# Patient Record
Sex: Female | Born: 1935
Health system: Southern US, Community
[De-identification: ages and names within clinical notes are randomized; demographics above are authoritative.]

## PROBLEM LIST (undated history)

## (undated) DIAGNOSIS — K7460 Unspecified cirrhosis of liver: Secondary | ICD-10-CM

## (undated) DIAGNOSIS — Z8719 Personal history of other diseases of the digestive system: Secondary | ICD-10-CM

## (undated) DIAGNOSIS — Z9889 Other specified postprocedural states: Secondary | ICD-10-CM

## (undated) DIAGNOSIS — I080 Rheumatic disorders of both mitral and aortic valves: Secondary | ICD-10-CM

## (undated) DIAGNOSIS — G40909 Epilepsy, unspecified, not intractable, without status epilepticus: Secondary | ICD-10-CM

## (undated) HISTORY — DX: Unspecified cirrhosis of liver: K74.60

## (undated) HISTORY — DX: Epilepsy, unspecified, not intractable, without status epilepticus: G40.909

## (undated) HISTORY — DX: Other specified postprocedural states: Z98.890

## (undated) HISTORY — DX: Rheumatic disorders of both mitral and aortic valves: I08.0

## (undated) HISTORY — DX: Personal history of other diseases of the digestive system: Z87.19

---

## 2001-11-25 ENCOUNTER — Encounter: Payer: Self-pay | Admitting: Emergency Medicine

## 2001-11-25 ENCOUNTER — Emergency Department (HOSPITAL_COMMUNITY): Admission: EM | Admit: 2001-11-25 | Discharge: 2001-11-25 | Payer: Self-pay | Admitting: Emergency Medicine

## 2004-10-24 ENCOUNTER — Ambulatory Visit: Payer: Self-pay | Admitting: Internal Medicine

## 2004-10-27 ENCOUNTER — Encounter: Admission: RE | Admit: 2004-10-27 | Discharge: 2004-10-27 | Payer: Self-pay | Admitting: Internal Medicine

## 2004-10-31 ENCOUNTER — Ambulatory Visit: Payer: Self-pay | Admitting: Internal Medicine

## 2004-11-02 ENCOUNTER — Ambulatory Visit: Payer: Self-pay | Admitting: Internal Medicine

## 2004-11-07 ENCOUNTER — Ambulatory Visit: Payer: Self-pay | Admitting: Internal Medicine

## 2004-11-11 ENCOUNTER — Ambulatory Visit: Payer: Self-pay | Admitting: Internal Medicine

## 2004-11-11 ENCOUNTER — Ambulatory Visit: Payer: Self-pay | Admitting: Pulmonary Disease

## 2004-11-11 ENCOUNTER — Inpatient Hospital Stay (HOSPITAL_COMMUNITY): Admission: EM | Admit: 2004-11-11 | Discharge: 2004-11-14 | Payer: Self-pay | Admitting: Emergency Medicine

## 2004-11-20 ENCOUNTER — Ambulatory Visit: Payer: Self-pay | Admitting: Internal Medicine

## 2004-11-28 ENCOUNTER — Ambulatory Visit: Payer: Self-pay | Admitting: Internal Medicine

## 2004-11-29 ENCOUNTER — Ambulatory Visit: Payer: Self-pay | Admitting: Internal Medicine

## 2004-12-11 ENCOUNTER — Ambulatory Visit: Payer: Self-pay | Admitting: Internal Medicine

## 2005-01-03 ENCOUNTER — Ambulatory Visit: Payer: Self-pay | Admitting: Internal Medicine

## 2005-01-10 ENCOUNTER — Ambulatory Visit: Payer: Self-pay | Admitting: Internal Medicine

## 2005-01-16 ENCOUNTER — Ambulatory Visit: Payer: Self-pay | Admitting: Internal Medicine

## 2005-02-19 ENCOUNTER — Ambulatory Visit: Payer: Self-pay | Admitting: Internal Medicine

## 2005-03-20 ENCOUNTER — Ambulatory Visit: Payer: Self-pay | Admitting: Internal Medicine

## 2005-04-26 ENCOUNTER — Ambulatory Visit: Payer: Self-pay | Admitting: Internal Medicine

## 2005-06-29 ENCOUNTER — Emergency Department (HOSPITAL_COMMUNITY): Admission: EM | Admit: 2005-06-29 | Discharge: 2005-06-29 | Payer: Self-pay | Admitting: Emergency Medicine

## 2005-08-14 ENCOUNTER — Ambulatory Visit: Payer: Self-pay | Admitting: Internal Medicine

## 2005-10-08 ENCOUNTER — Ambulatory Visit: Payer: Self-pay | Admitting: Internal Medicine

## 2005-12-13 ENCOUNTER — Ambulatory Visit: Payer: Self-pay | Admitting: Internal Medicine

## 2005-12-19 ENCOUNTER — Encounter: Admission: RE | Admit: 2005-12-19 | Discharge: 2005-12-19 | Payer: Self-pay | Admitting: Orthopedic Surgery

## 2006-03-01 ENCOUNTER — Ambulatory Visit: Payer: Self-pay | Admitting: Internal Medicine

## 2006-05-12 ENCOUNTER — Emergency Department (HOSPITAL_COMMUNITY): Admission: EM | Admit: 2006-05-12 | Discharge: 2006-05-12 | Payer: Self-pay | Admitting: Emergency Medicine

## 2006-05-14 ENCOUNTER — Ambulatory Visit: Payer: Self-pay | Admitting: Internal Medicine

## 2006-06-12 ENCOUNTER — Ambulatory Visit: Payer: Self-pay | Admitting: Pulmonary Disease

## 2006-06-13 ENCOUNTER — Ambulatory Visit (HOSPITAL_COMMUNITY): Admission: RE | Admit: 2006-06-13 | Discharge: 2006-06-13 | Payer: Self-pay | Admitting: Pulmonary Disease

## 2006-06-18 ENCOUNTER — Ambulatory Visit: Payer: Self-pay | Admitting: Pulmonary Disease

## 2006-06-27 ENCOUNTER — Ambulatory Visit: Payer: Self-pay | Admitting: Internal Medicine

## 2006-06-28 ENCOUNTER — Ambulatory Visit: Payer: Self-pay

## 2006-06-28 ENCOUNTER — Encounter: Payer: Self-pay | Admitting: Internal Medicine

## 2006-07-01 ENCOUNTER — Inpatient Hospital Stay (HOSPITAL_BASED_OUTPATIENT_CLINIC_OR_DEPARTMENT_OTHER): Admission: RE | Admit: 2006-07-01 | Discharge: 2006-07-01 | Payer: Self-pay | Admitting: Cardiology

## 2006-07-01 ENCOUNTER — Ambulatory Visit: Payer: Self-pay | Admitting: Cardiology

## 2006-07-22 ENCOUNTER — Ambulatory Visit: Payer: Self-pay | Admitting: Internal Medicine

## 2006-08-19 ENCOUNTER — Ambulatory Visit: Payer: Self-pay | Admitting: Pulmonary Disease

## 2006-09-11 ENCOUNTER — Ambulatory Visit (HOSPITAL_COMMUNITY): Admission: RE | Admit: 2006-09-11 | Discharge: 2006-09-11 | Payer: Self-pay | Admitting: Internal Medicine

## 2006-09-11 ENCOUNTER — Ambulatory Visit: Payer: Self-pay | Admitting: Internal Medicine

## 2006-09-11 ENCOUNTER — Encounter: Payer: Self-pay | Admitting: Internal Medicine

## 2006-09-26 ENCOUNTER — Ambulatory Visit: Payer: Self-pay | Admitting: Pulmonary Disease

## 2006-09-27 ENCOUNTER — Ambulatory Visit: Payer: Self-pay | Admitting: Internal Medicine

## 2006-11-05 ENCOUNTER — Ambulatory Visit: Payer: Self-pay | Admitting: Internal Medicine

## 2007-03-17 ENCOUNTER — Ambulatory Visit: Payer: Self-pay | Admitting: Internal Medicine

## 2007-03-17 LAB — CONVERTED CEMR LAB
Albumin: 2.9 g/dL — ABNORMAL LOW (ref 3.5–5.2)
Creatinine, Ser: 0.6 mg/dL (ref 0.4–1.2)
INR: 1.5 (ref 0.9–2.0)
Prothrombin Time: 15.3 s — ABNORMAL HIGH (ref 10.0–14.0)
Sodium: 144 meq/L (ref 135–145)
Total Bilirubin: 2.5 mg/dL — ABNORMAL HIGH (ref 0.3–1.2)
aPTT: 46.6 s — ABNORMAL HIGH (ref 26.5–36.5)

## 2007-03-28 ENCOUNTER — Ambulatory Visit: Payer: Self-pay | Admitting: Internal Medicine

## 2007-03-28 ENCOUNTER — Inpatient Hospital Stay (HOSPITAL_COMMUNITY): Admission: EM | Admit: 2007-03-28 | Discharge: 2007-04-03 | Payer: Self-pay | Admitting: Emergency Medicine

## 2007-04-03 ENCOUNTER — Encounter: Payer: Self-pay | Admitting: Internal Medicine

## 2007-05-06 ENCOUNTER — Ambulatory Visit: Payer: Self-pay | Admitting: Internal Medicine

## 2007-05-06 LAB — CONVERTED CEMR LAB
Albumin: 3 g/dL — ABNORMAL LOW (ref 3.5–5.2)
Creatinine, Ser: 0.7 mg/dL (ref 0.4–1.2)
INR: 1.6 (ref 0.9–2.0)
Prothrombin Time: 15.4 s — ABNORMAL HIGH (ref 10.0–14.0)
Sodium: 139 meq/L (ref 135–145)
Total Bilirubin: 2.9 mg/dL — ABNORMAL HIGH (ref 0.3–1.2)
aPTT: 51.4 s — ABNORMAL HIGH (ref 26.5–36.5)

## 2007-06-05 ENCOUNTER — Telehealth: Payer: Self-pay | Admitting: Internal Medicine

## 2007-06-09 ENCOUNTER — Encounter: Admission: RE | Admit: 2007-06-09 | Discharge: 2007-06-09 | Payer: Self-pay | Admitting: Pediatrics

## 2007-06-09 DIAGNOSIS — K7460 Unspecified cirrhosis of liver: Secondary | ICD-10-CM

## 2007-06-09 DIAGNOSIS — M810 Age-related osteoporosis without current pathological fracture: Secondary | ICD-10-CM

## 2007-06-09 DIAGNOSIS — Z8719 Personal history of other diseases of the digestive system: Secondary | ICD-10-CM

## 2007-06-16 ENCOUNTER — Encounter: Admission: RE | Admit: 2007-06-16 | Discharge: 2007-06-16 | Payer: Self-pay | Admitting: Internal Medicine

## 2007-07-04 HISTORY — PX: LIVER TRANSPLANT: SHX410

## 2007-07-14 ENCOUNTER — Telehealth: Payer: Self-pay | Admitting: Internal Medicine

## 2007-07-29 ENCOUNTER — Encounter: Payer: Self-pay | Admitting: Internal Medicine

## 2007-11-06 ENCOUNTER — Ambulatory Visit: Payer: Self-pay | Admitting: Physical Medicine & Rehabilitation

## 2007-11-06 ENCOUNTER — Inpatient Hospital Stay (HOSPITAL_COMMUNITY)
Admission: RE | Admit: 2007-11-06 | Discharge: 2007-11-18 | Payer: Self-pay | Admitting: Physical Medicine & Rehabilitation

## 2008-04-19 ENCOUNTER — Encounter: Payer: Self-pay | Admitting: Internal Medicine

## 2008-04-27 ENCOUNTER — Telehealth: Payer: Self-pay | Admitting: Internal Medicine

## 2008-04-28 ENCOUNTER — Telehealth: Payer: Self-pay | Admitting: Internal Medicine

## 2008-07-26 ENCOUNTER — Encounter: Payer: Self-pay | Admitting: Internal Medicine

## 2008-09-14 ENCOUNTER — Ambulatory Visit: Payer: Self-pay | Admitting: Internal Medicine

## 2008-11-17 ENCOUNTER — Ambulatory Visit: Payer: Self-pay | Admitting: Internal Medicine

## 2008-11-18 ENCOUNTER — Ambulatory Visit: Payer: Self-pay | Admitting: Internal Medicine

## 2008-11-19 ENCOUNTER — Telehealth: Payer: Self-pay | Admitting: Internal Medicine

## 2009-01-24 ENCOUNTER — Encounter: Payer: Self-pay | Admitting: Internal Medicine

## 2009-02-02 ENCOUNTER — Telehealth: Payer: Self-pay | Admitting: Internal Medicine

## 2009-04-12 ENCOUNTER — Telehealth: Payer: Self-pay | Admitting: Internal Medicine

## 2009-04-27 ENCOUNTER — Telehealth: Payer: Self-pay | Admitting: Internal Medicine

## 2009-05-09 ENCOUNTER — Telehealth: Payer: Self-pay | Admitting: Internal Medicine

## 2009-05-26 ENCOUNTER — Encounter: Payer: Self-pay | Admitting: Internal Medicine

## 2009-07-25 ENCOUNTER — Encounter: Payer: Self-pay | Admitting: Internal Medicine

## 2009-09-19 ENCOUNTER — Ambulatory Visit: Payer: Self-pay | Admitting: Internal Medicine

## 2009-11-15 ENCOUNTER — Telehealth: Payer: Self-pay | Admitting: Internal Medicine

## 2010-01-30 ENCOUNTER — Encounter: Payer: Self-pay | Admitting: Internal Medicine

## 2010-03-28 ENCOUNTER — Telehealth: Payer: Self-pay | Admitting: Internal Medicine

## 2010-04-03 ENCOUNTER — Telehealth: Payer: Self-pay | Admitting: Internal Medicine

## 2010-05-05 ENCOUNTER — Telehealth: Payer: Self-pay | Admitting: Internal Medicine

## 2010-05-09 ENCOUNTER — Telehealth: Payer: Self-pay | Admitting: Internal Medicine

## 2010-06-08 ENCOUNTER — Ambulatory Visit: Payer: Self-pay | Admitting: Internal Medicine

## 2010-06-08 DIAGNOSIS — R109 Unspecified abdominal pain: Secondary | ICD-10-CM | POA: Insufficient documentation

## 2010-06-08 DIAGNOSIS — G4700 Insomnia, unspecified: Secondary | ICD-10-CM | POA: Insufficient documentation

## 2010-06-08 DIAGNOSIS — Z944 Liver transplant status: Secondary | ICD-10-CM | POA: Insufficient documentation

## 2010-06-08 LAB — HM MAMMOGRAPHY

## 2010-06-09 LAB — CONVERTED CEMR LAB
ALT: 26 units/L (ref 0–35)
Alkaline Phosphatase: 59 units/L (ref 39–117)
Calcium: 9 mg/dL (ref 8.4–10.5)
Chloride: 105 meq/L (ref 96–112)
Creatinine, Ser: 1 mg/dL (ref 0.4–1.2)
GFR calc non Af Amer: 56.7 mL/min (ref >60)
Glucose, Bld: 86 mg/dL (ref 70–99)
MCHC: 34.6 g/dL (ref 30.0–36.0)
MCV: 98 fL (ref 78.0–100.0)
Neutrophils Relative %: 62.7 % (ref 43.0–77.0)
Platelets: 185 10*3/uL (ref 150.0–400.0)
RDW: 13.4 % (ref 11.5–14.6)
Sodium: 140 meq/L (ref 135–145)
Total Protein: 6.7 g/dL (ref 6.0–8.3)
WBC: 3.9 10*3/uL — ABNORMAL LOW (ref 4.5–10.5)

## 2010-07-31 ENCOUNTER — Encounter: Payer: Self-pay | Admitting: Internal Medicine

## 2010-10-12 ENCOUNTER — Ambulatory Visit: Payer: Self-pay | Admitting: Internal Medicine

## 2010-11-13 ENCOUNTER — Telehealth: Payer: Self-pay | Admitting: Internal Medicine

## 2010-12-31 LAB — CONVERTED CEMR LAB
ALT: 46 units/L — ABNORMAL HIGH (ref 0–35)
Albumin: 3.7 g/dL (ref 3.5–5.2)
Alkaline Phosphatase: 95 units/L (ref 39–117)
Bilirubin, Direct: 0.1 mg/dL (ref 0.0–0.3)
Lymphocytes Relative: 32.9 % (ref 12.0–46.0)
MCV: 93.9 fL (ref 78.0–100.0)
Monocytes Absolute: 0.2 10*3/uL (ref 0.1–1.0)
Neutrophils Relative %: 54.8 % (ref 43.0–77.0)
Platelets: 104 10*3/uL — ABNORMAL LOW (ref 150–400)
Total Bilirubin: 0.8 mg/dL (ref 0.3–1.2)
Total Protein: 6.7 g/dL (ref 6.0–8.3)
WBC: 2.5 10*3/uL — ABNORMAL LOW (ref 4.5–10.5)

## 2011-01-02 NOTE — Progress Notes (Signed)
Summary: [name] cr  Phone Note Refill Request Call back at [facility] Phone [phone] Message from:  Patient---live call  Refills Requested: Medication #1:  AMBIEN CR 12.5 MG CR-TABS take one tab at bedtime. fax to [facility]  Initial call taken by: Warnell Forester,  March 28, 2010 9:59 AM  Follow-up for Phone Call        see Rx Follow-up by: Gladis Riffle, RN,  March 28, 2010 2:53 PM    Prescriptions: AMBIEN CR 12.5 MG CR-TABS (ZOLPIDEM TARTRATE) take one tab at bedtime  #90 x 1   Entered by:   Gladis Riffle, RN   Authorized by:   Birdie Sons MD   Signed by:   Gladis Riffle, RN on 03/28/2010   Method used:   Printed then faxed to ...       [facility]* (mail-order)             ,          Ph: [phone]       Fax: [phone]   RxID:   [id number]

## 2011-01-02 NOTE — Assessment & Plan Note (Signed)
Summary: FLU SHOT // RS   Nurse Visit   Allergies: 1)  Codeine Phosphate (Codeine Phosphate)  Orders Added: 1)  Flu Vaccine 48yrs + MEDICARE PATIENTS [Q2039] 2)  Administration Flu vaccine - MCR [G0008] Flu Vaccine Consent Questions     Do you have a history of severe allergic reactions to this vaccine? no    Any prior history of allergic reactions to egg and/or gelatin? no    Do you have a sensitivity to the preservative Thimersol? no    Do you have a past history of Guillan-Barre Syndrome? no    Do you currently have an acute febrile illness? no    Have you ever had a severe reaction to latex? no    Vaccine information given and explained to patient? yes    Are you currently pregnant? no    Lot Number:[id number]   Exp Date:06/02/2011   Site Given  Left Deltoid IM]  .lbmedflu

## 2011-01-02 NOTE — Progress Notes (Signed)
Summary: Ambien/ [facility]  Phone Note Outgoing Call   Call placed by: Mervin Hack CMA Duncan Dull),  May 05, 2010 10:18 AM Call placed to: Patient Summary of Call: calling to advise that we received a refill request for Zolpidem Tartrate ER tabs, but I see this was done in April. Pt's husband states they only want BMN which was on the refill, husband states they have mailed to [facility] already, he will call to [facility] to see if they received. If not he will call us back for refill. I will change in pt's chart BMN. Initial call taken by: Mervin Hack CMA (AAMA),  May 05, 2010 10:21 AM    New/Updated Medications: AMBIEN CR 12.5 MG CR-TABS (ZOLPIDEM TARTRATE) take one tab at bedtime (Brand Name Only)

## 2011-01-02 NOTE — Progress Notes (Signed)
Summary: Pt req refill of Pantoprazole Sodium--needs ov  Phone Note Call from Patient Call back at Home Phone [phone]   Caller: spouse-William Summary of Call: Pts spouse called and said that his wife needs refill of Pantoprazole Sodium Tabs (generic for Protonix tabs) Please call in to [facility].      Initial call taken by: Lucy Antigua,  May 09, 2010 2:00 PM  Follow-up for Phone Call        see Rx.  Husband notified. Follow-up by: Gladis Riffle, RN,  May 09, 2010 2:29 PM    Prescriptions: PROTONIX 40 MG TBEC (PANTOPRAZOLE SODIUM) Take 1 tablet by mouth once a day  #90 x 1   Entered by:   Gladis Riffle, RN   Authorized by:   Birdie Sons MD   Signed by:   Gladis Riffle, RN on 05/09/2010   Method used:   Electronically to        [facility]* (mail-order)             ,          Ph: [phone]       Fax: [phone]   RxID:   [id number]

## 2011-01-02 NOTE — Progress Notes (Signed)
Summary: REFILL alendronate  Phone Note Refill Request Call back at Home Phone [phone] Message from:  SPOUSE---LIVE CALL on GENERIC ONLY  Refills Requested: Medication #1:  FOSAMAX 70 MG TABS Take 1 tablet by mouth once a week   Brand Name Necessary? No FAX TO [facility]  Initial call taken by: Warnell Forester,  Apr 03, 2010 10:47 AM  Follow-up for Phone Call        see Rx.  Patient husband notified.  Follow-up by: Gladis Riffle, RN,  Apr 03, 2010 11:47 AM    New/Updated Medications: FOSAMAX 35 MG TABS (ALENDRONATE SODIUM) one tablet by mouth once a week Prescriptions: FOSAMAX 35 MG TABS (ALENDRONATE SODIUM) one tablet by mouth once a week  #14 x 3   Entered by:   Gladis Riffle, RN   Authorized by:   Birdie Sons MD   Signed by:   Gladis Riffle, RN on 04/03/2010   Method used:   Electronically to        [facility]* (mail-order)             ,          Ph: [phone]       Fax: [phone]   RxID:   [id number]

## 2011-01-02 NOTE — Letter (Signed)
Summary: [facility]  [facility]   Imported By: Lester Onley 02/23/2010 08:52:27  _____________________________________________________________________  External Attachment:    Type:   Image     Comment:   External Document

## 2011-01-02 NOTE — Letter (Signed)
Summary: [facility]  [facility]   Imported By: Sherian Rein 09/01/2010 08:52:30  _____________________________________________________________________  External Attachment:    Type:   Image     Comment:   External Document

## 2011-01-02 NOTE — Assessment & Plan Note (Signed)
Summary: emp/pt fasting/cjr   Vital Signs:  Patient profile:   77 year old female Menstrual status:  postmenopausal Height:      61 inches Weight:      133 pounds BMI:     25.22 Pulse rate:   80 / minute Pulse rhythm:   regular BP sitting:   120 / 60  Vitals Entered By: Gladis Riffle, RN (June 08, 2010 10:08 AM) CC: annual review of systems,fasting Is Patient Diabetic? No     Menstrual Status postmenopausal Last PAP Result normal-pts report1/1/11   CC:  annual review of systems and fasting.  History of Present Illness: Here for Medicare AWV:  1.   Risk factors based on Past M, S, F history: see list 2.   Physical Activities: she is able to do all of adls 3.   Depression/mood: ---pt denies 4.   Hearing..no compliants:  5.   ADL's: --able to do all 6.   Fall Risk: --none noted 7.   Home Safety: no concerns 8.   Height, weight-, & visual acuity: no concerns identified 9.   Counseling: advised regular exercise, low fat diet 10.   Labs ordered based on risk factors: ---see list 11.           Referral Coordination---none needed 12.           Care Plan---advised regular exercise 13.            Cognitive Assessment---motor, sensory and cognitive functions are intact  new complaints---abdominal bloating she notes a constant feeling of bloating---not related to eating. this has been an intermittent complaint since liver transplant  liver transplant -- followed at [facility]  hepatopulmonary syndrome---complicated liver transplant, but she is doing very well and is completely off 02  All other systems reviewed and were negative    Preventive Screening-Counseling & Management  Alcohol-Tobacco     Smoking Status: quit     Year Quit: 2000  Current Problems (verified): 1)  Preventive Health Care  (ICD-V70.0) 2)  Liver Replaced By Transplant  (ICD-V42.7) 3)  Insomnia-sleep Disorder-unspec  (ICD-780.52) 4)  Abdominal Pain  (ICD-789.00) 5)  Osteoporosis  (ICD-733.00) 6)   Gastrointestinal Hemorrhage, Hx of  (ICD-V12.79) 7)  Cirrhosis  (ICD-571.5)  Current Medications (verified): 1)  Fosamax 35 Mg Tabs (Alendronate Sodium) .... One Tablet By Mouth Once A Week 2)  Protonix 40 Mg Tbec (Pantoprazole Sodium) .... Take 1 Tablet By Mouth Once A Day 3)  Prograf 1 Mg Caps (Tacrolimus) .... Take 5 Daily 4)  Keppra 500 Mg Tabs (Levetiracetam) .... Take 2 Daily 5)  Multivitamins  Tabs (Multiple Vitamin) .... Once Daily 6)  Vitamin D 1000 Unit Caps (Cholecalciferol) .... Once Daily 7)  Glucosamine 1500 Complex  Caps (Glucosamine-Chondroit-Vit C-Mn) .... Once Daily 8)  Ambien Cr 12.5 Mg Cr-Tabs (Zolpidem Tartrate) .... Take One Tab At Bedtime (Brand Name Only)  Allergies: 1)  Codeine Phosphate (Codeine Phosphate)  Past History:  Past Medical History: Last updated: 06/09/2007 Cirrhosis Gastrointestinal hemorrhage, hx of Osteoporosis  Past Surgical History: Last updated: 11-20-08 GI bleed liver transplant---complicated course ([facility])---8/08  Family History: Last updated: 20-Nov-2008 father-deceased 10 yo mother deceased 41 yo  Social History: Last updated: November 20, 2008 Married Alcohol use-no---previous alcohol abuse  Risk Factors: Smoking Status: quit (06/08/2010)  Social History: Smoking Status:  quit  Physical Exam  General:  alert and well-developed.   Head:  normocephalic and atraumatic.   Eyes:  pupils equal and pupils round.  no icterus Ears:  R  ear normal and L ear normal.   Neck:  No deformities, masses, or tenderness noted. Chest Wall:  No deformities, masses, or tenderness noted. Heart:  normal rate and regular rhythm.   Abdomen:  soft and non-tender.   Msk:  No deformity or scoliosis noted of thoracic or lumbar spine.   Neurologic:  cranial nerves II-XII intact and gait normal.   Skin:  turgor normal and color normal.   Psych:  normally interactive and good eye contact.     Impression & Recommendations:  Problem # 1:  Preventive  Health Care (ICD-V70.0) health maint UTD colonoscopy scheduled by [facility] i've encouraged her to stay physicially active  Problem # 2:  ABDOMINAL PAIN (ICD-789.00) bloating sensation take labs today she has f/u with [facility] within the next rew weeks. I think this should be addressed there  Problem # 3:  INSOMNIA-SLEEP DISORDER-UNSPEC (ICD-780.52) discussed other meds she will consider other possibilities but she prefers avoiding additional or different meds... I agree try to minimize medications Her updated medication list for this problem includes:    Ambien Cr 12.5 Mg Cr-tabs (Zolpidem tartrate) .Marland Kitchen... Take one tab at bedtime (brand name only)  Complete Medication List: 1)  Fosamax 35 Mg Tabs (Alendronate sodium) .... One tablet by mouth once a week 2)  Protonix 40 Mg Tbec (Pantoprazole sodium) .... Take 1 tablet by mouth once a day 3)  Prograf 1 Mg Caps (Tacrolimus) .... Take 5 daily 4)  Keppra 500 Mg Tabs (Levetiracetam) .... Take 2 daily 5)  Multivitamins Tabs (Multiple vitamin) .... Once daily 6)  Vitamin D 1000 Unit Caps (Cholecalciferol) .... Once daily 7)  Glucosamine 1500 Complex Caps (Glucosamine-chondroit-vit c-mn) .... Once daily 8)  Ambien Cr 12.5 Mg Cr-tabs (Zolpidem tartrate) .... Take one tab at bedtime (brand name only)  Other Orders: Venipuncture (78242) TLB-BMP (Basic Metabolic Panel-BMET) (80048-METABOL) TLB-CBC Platelet - w/Differential (85025-CBCD) TLB-Hepatic/Liver Function Pnl (80076-HEPATIC) TLB-TSH (Thyroid Stimulating Hormone) (35361-WER)   Preventive Care Screening  Colonoscopy:    Date:  01/03/2005    Next Due:  01/2011    Results:  gi bleed   Mammogram:    Date:  12/03/2009    Next Due:  12/2011    Results:  normal-pt's report   Pap Smear:    Date:  12/03/2009    Next Due:  12/2012    Results:  normal-pts report1/1/11

## 2011-01-04 NOTE — Progress Notes (Signed)
Summary: REFILL REQUEST  Phone Note Refill Request Message from:  Patient on November 13, 2010 10:45 AM  Refills Requested: Medication #1:  PROTONIX 40 MG TBEC Take 1 tablet by mouth once a day   Notes: [facility].    Initial call taken by: Debbra Riding,  November 13, 2010 10:45 AM  Follow-up for Phone Call        Rx called to pharmacy Follow-up by: Alfred Levins, CMA,  November 13, 2010 11:41 AM    Prescriptions: PROTONIX 40 MG TBEC (PANTOPRAZOLE SODIUM) Take 1 tablet by mouth once a day  #90 x 1   Entered by:   Alfred Levins, CMA   Authorized by:   Birdie Sons MD   Signed by:   Alfred Levins, CMA on 11/13/2010   Method used:   Electronically to        [facility]* (retail)             ,          Ph: [phone]       Fax: [phone]   RxID:   [id number]

## 2011-01-29 ENCOUNTER — Encounter: Payer: Self-pay | Admitting: Internal Medicine

## 2011-02-19 ENCOUNTER — Telehealth: Payer: Self-pay | Admitting: Internal Medicine

## 2011-02-19 DIAGNOSIS — G4700 Insomnia, unspecified: Secondary | ICD-10-CM

## 2011-02-19 NOTE — Telephone Encounter (Signed)
Pt needs refill of Ambien 12.5 mg CR to [facility] order.

## 2011-02-20 NOTE — Letter (Signed)
Summary: [facility]  [facility]   Imported By: Lester Inverness 02/12/2011 09:08:29  _____________________________________________________________________  External Attachment:    Type:   Image     Comment:   External Document

## 2011-02-22 MED ORDER — ZOLPIDEM TARTRATE ER 12.5 MG PO TBCR: 12.5000 mg | EXTENDED_RELEASE_TABLET | Freq: Every evening | ORAL | Status: DC | PRN

## 2011-02-22 NOTE — Telephone Encounter (Signed)
rx faxed to [facility].

## 2011-04-17 NOTE — Discharge Summary (Signed)
[name], [name]               ACCOUNT NO.:  [id number]   MEDICAL RECORD NO.:  [id number]          PATIENT TYPE:  IPS   LOCATION:  4010                         FACILITY:  [facility]   PHYSICIAN:  Ranelle Oyster, M.D.DATE OF BIRTH:  25-Dec-1931   DATE OF ADMISSION:  11/06/2007  DATE OF DISCHARGE:  11/18/2007                               DISCHARGE SUMMARY   DISCHARGE DIAGNOSES:  1. Deconditioning after liver transplant August 2008 with multimedical      issues.  2. Seizure disorder.  3. Gastroesophageal reflux disease.  4. Depression.  5. Beta lactamase urinary tract infection.   A 77 year old female with a history of cirrhosis due to chronic  alcoholism with orthoptic liver transplant July 18, 2007 at [facility] complicated by respiratory failure September 20 with  tracheostomy September 26, 2007.  Developed Clostridium difficile colitis.  Completed a full course of Flagyl.  Pertaining to recent liver  transplant, she was maintained on Tacrolimus and steroids of different  doses throughout her hospital stay.  Echocardiogram October 5 showed  normal left ventricular function.  Bouts of PAF, and placed on Cardizem  rate controlled, contact precautions for MRSA.  All antibiotics  completed.  Tracheostomy removed.  Diet advanced.  She had been admitted  to [facility] for a short time as she continued to improve for  deconditioning.   PAST MEDICAL HISTORY:  See discharge diagnoses.  Remote smoker.  Remote  alcohol.   ALLERGIES:  Codeine.   SOCIAL HISTORY:  Lives with daughter in [place] 4-level home, bedroom  upstairs, daughter works day shifts.  Husband can assist.   MEDICATIONS PRIOR TO ADMISSION:  Not made available.   REHABILITATION HOSPITAL COURSE:  The patient was admitted to inpatient  rehab services with therapies initiated on a 3-hour daily basis  consisting of physical therapy, occupational therapy, and rehabilitation  nursing.  The following issues were  addressed during the patient's  rehabilitation stay.  Pertaining to [name]'s deconditioning after  liver transplant August 2008 she was doing quite well.  She would follow  up with [facility].  Routine weekly labs had been ordered with  latest chemistries December 15 showing hemoglobin 9.5, hematocrit 27,  platelet 179,000.  Sodium 138, potassium 4.0, BUN 80, creatinine 0.7.  She would remain on her Tacrolimus as well as prednisone 15 mg daily  with changes made at the recommendations of [facility].  She  had a history of seizure disorder.  No seizure during her rehabilitation  stay.  She remained on Keppra of 500 mg q.12 h.  She had a history of  depression after long hospital stay.  She was tapered off her Risperdal.  She was on low doses of Klonopin q.12 h.  Iron supplement had been added  for some anemia.   Overall for a functional status she was minimal assist to close  supervision ambulating 110 feet, handheld assistance for navigating  stairs, close supervision for activities of daily living.  Overall her  strength and endurance had greatly improved; and she was encouraged with  her overall progress with plan  to be discharged to home.   DISCHARGE MEDICATIONS INCLUDED:  1. Tacrolimus 4 mg sublingual q.12 h.  2. Klonopin 0.25 mg q.12 h.  3. Aspirin 81 mg p.o. daily.  4. Protonix 40 mg daily.  5. Keppra 500 mg p.o. q.12 h.  6. Prednisone 15 mg daily.  7. Trinsicon 1 capsule twice daily.  8. Oxycodone immediate release had been DISCONTINUED.  9. Tylenol as needed.   ACTIVITY:  As tolerated.   DIET:  Regular.   SPECIAL INSTRUCTIONS:  The patient should follow up at [facility] after recent liver transplant Dr. Cato Mulligan as  medical management  with [facility].      [name], P.A.      Ranelle Oyster, M.D.  Electronically Signed    DA/MEDQ  D:  11/17/2007  T:  11/17/2007  Job:  [id number]   cc:   Ranelle Oyster,  M.D.  Bruce Rexene Edison Cato Mulligan, MD  Hedwig Morton. Juanda Chance, MD

## 2011-04-17 NOTE — H&P (Signed)
[name], [name] NO.:  [id number]   MEDICAL RECORD NO.:  [id number]          PATIENT TYPE:  IPS   LOCATION:  4010                         FACILITY:  [facility]   PHYSICIAN:  Ellwood Dense, M.D.   DATE OF BIRTH:  Jan 28, 1932   DATE OF ADMISSION:  11/06/2007  DATE OF DISCHARGE:                              HISTORY & PHYSICAL   PRIMARY CARE PHYSICIAN:  Dr. Cato Mulligan.   CARDIOLOGY:  [facility].   GASTROENTEROLOGY:  Dr. Lina Sar.   HEPATOLOGIST:  [facility].   HISTORY OF PRESENT ILLNESS:  [name] is a 77 year old adult female  with history of alcoholic cirrhosis with prior orthotopic liver  transplant at [facility] July 18, 2007.  That was  complicated by respiratory failure August 23, 2007 and eventual  tracheostomy September 26, 2007.  She also developed Clostridium difficile  colitis and was treated with full course of Flagyl.   The patient was at that point was judged to need extensive care and was  transferred to [facility].  She has been at the [facility]  for the past several weeks.  She has a history of this liver transplant  prior to her respiratory failure, and she has had been maintained on a  combination of tacrolimus along with prednisone with variable doses.   Echocardiogram September 07, 2007 showed normal left ventricular systolic  function.  She has had bouts of paroxysmal atrial fibrillation and was  placed on a Cardizem drip with rate control.  She has had contact  precautions for MRSA infection with subsequent antibiotics being  completed.  Tracheostomy has since been discontinued, and she has had  her panda tube recently discontinued with start of oral diet.  She has  variable intake right now with some history of poor intake dating for  years.   The patient was evaluated by the rehabilitation physicians and felt to  be an appropriate candidate for inpatient dilatation.   REVIEW  OF SYSTEMS:  Positive for depression and seizures.   PAST MEDICAL HISTORY:  1. History of seizure disorder with one questionable episode in April,      2008 with ongoing treatment with Keppra.  2. Mixed alcoholic cirrhosis with orthotopic liver transplant July 18, 2007 at [facility].  3. Chronic respiratory failure with right to left intrapulmonary      shunting.  4. Depression.  5. Gastroesophageal reflux disease with esophageal varices.   FAMILY HISTORY:  Noncontributory.   SOCIAL HISTORY:  The patient lives with her husband and daughter in  [place] in a four-level home with bedrooms upstairs.  The daughter  works days, but the husband can assist as needed and is in good health  overall.  She has a remote history of tobacco and alcohol usage in the  past.   FUNCTIONAL HISTORY PRIOR TO ADMISSION:  Independent prior to liver  transplant August 2008.   ALLERGIES:  CODEINE.   MEDICATIONS /PRIOR TO ADMISSION:  Family to bring in a list.   LABORATORY:  No recent labs noted  at [facility].   PHYSICAL EXAMINATION:  Reasonably well-appearing, thin, elderly adult  female lying in bed in no acute discomfort.  Vitals were not yet  obtained.  HEENT:  Normocephalic, nontraumatic.  CARDIOVASCULAR:  Regular rate and rhythm, S1-S2 without murmurs.  ABDOMEN:  Soft, nontender with positive bowel sounds with well-healed  wounds with J tube in place which is capped.  NEUROLOGIC:  Alert and oriented x3.  Cranial nerves II-XII were grossly  intact.  Bilateral upper extremity exam showed 4-/5 strength throughout.  Bulk and tone were normal, and reflexes were 2+ and symmetrical.  Sensation was intact to light touch throughout the bilateral upper  extremities.  Lower extremity exam showed hip flexion, knee extension  and ankle dorsiflexion at 3+ to 4-/5.  Bulk was decreased and tone was  normal.  Sensation was intact to light touch throughout the bilateral  lower  extremities.   IMPRESSION:  1. Status post liver transplant July 18, 2007 with multiple medical      complications including ventilatory dependent respiratory failure      along with dysphagia.  2. History of reported seizure disorder x1 April 2008.   Presently, the patient has deficits in ADLs, transfers and ambulation  related to the above-noted liver transplant with subsequent  deconditioning related to respiratory failure.   PLAN:  1. Admit to the rehabilitation unit for daily therapies to include      physical therapy for range of motion, strengthening, bed mobility,      transfers, pre-gait training, gait training and equipment      evaluation.  2. Occupational therapy for range of motion, strengthening, ADLs,      cognitive/perceptional training, splinting and equipment      evaluation.  3. Rehabilitation nursing for skin care, wound care and bowel and      bladder training as necessary.  4. Speech therapy for higher level cognition along with evaluation of      swallow as necessary.  5. Case management to assess home environment, assist with discharge      planning and arrange for appropriate follow-up care.  6. Social work to assess family and social support and assist in      discharge planning.  7. Continue regular diet.  8. Check admission labs including CBC and CMET in a.m. November 07, 2007.  9. Incentive spirometry q.2h. while awake.  10.Oxycodone 5 mg 1 tablet q.4h. p.r.n. for pain.  11.Two of 2 liters of O2 by nasal cannula at all times to keep      saturations greater than or equal to 90%.  12.Routine turning to prevent skin breakdown.  13.Dulcolax suppository 1 per rectum daily p.r.n.  14.Foley tube to straight drainage at present.  15.Draw blood from IV.  16.Lomotil 2 tablets p.o. q.8h. p.r.n. for loose stools/  17.Prednisone 17 mg p.o. daily.  18.Tacrolimus 4 mg sublingual q.12h.  19.Clonazepam 0.25 mg p.o. q.12h.  20.Risperidone 0.5 mg p.o.  q.12h.  21.Ambien CR 5 mg p.o. every night p.r.n. for sleep.  22.Aspirin 81 mg p.o. daily.  23.Omeprazole 20 mg p.o. daily.  24.Keppra 500 mg p.o. q.12h.  25.Grounds pass p.r.n. with staff or family.  26.Barrier cream to sacral area b.i.d.  27.Reinserted new Foley [place] #22.   PROGNOSIS:  Good.   ESTIMATED LENGTH OF STAY:  10-20 days.   GOALS:  Modified independent, ADLs, transfers and ambulation.           ______________________________  Ellwood Dense,  M.D.     DC/MEDQ  D:  11/06/2007  T:  11/06/2007  Job:  [id number]

## 2011-04-20 NOTE — Discharge Summary (Signed)
[name], [name]               ACCOUNT NO.:  [id number]   MEDICAL RECORD NO.:  [id number]          PATIENT TYPE:  INP   LOCATION:  1419                         FACILITY:  [facility]   PHYSICIAN:  Rosalyn Gess. Norins, MD  DATE OF BIRTH:  Oct 02, 1932   DATE OF ADMISSION:  03/28/2007  DATE OF DISCHARGE:  04/03/2007                               DISCHARGE SUMMARY   ADMISSION DIAGNOSES:  1. Dehydration.  2. Chronic cirrhosis with hepatic failure.  3. Chronic respiratory failure with right to left intrapulmonary      shunting.  4. Peripheral neuropathy.   DISCHARGE DIAGNOSES:  1. Dehydration.  2. Chronic cirrhosis with hepatic failure.  3. Chronic respiratory failure with right to left intrapulmonary      shunting.  4. Peripheral neuropathy.  5. New-onset seizure disorder.   CONSULTANTS:  Dr. Ellison Carwin, neurology.   PROCEDURES:  1. PA and lateral chest x-ray, March 28, 2007, with cardiomegaly with      chronic interstitial opacities.  2. CT of the brain without contrast, March 29, 2007, with no      intracranial hemorrhage, question of acute infarct right lenticular      nucleus. 3.  MRI of the brain without contrast, March 30, 2007,      revealing three areas of blood breakdown products two of which are      linear and do not have significant surround vasogenic edema at the      left parietofrontal and right parietotemporal areas.  Left temporal      lobe abnormalities rounded in configuration with surrounding white      matter, suggestive of mild vasogenic edema and demonstrates minimal      enhancement.  Hepatocellular changes best seen on precontrast      imaging of the ventricular nucleus and cerebral peduncles.  Atrophy      and mild to moderate nonspecific white matter type changes.  No      acute infarct seen.  Mild mucosal thickening of paranasal sinusitis      and mastoid air cells.  3. MRI angiography with intracranial atherosclerotic type changes      noted with  no specific abnormality otherwise mentioned.   HISTORY OF PRESENT ILLNESS:  The patient is a 77 year old woman followed  for chronic hepatic failure, GERD and osteoporosis who presented with  several days of polyuria, slight diarrhea and decreased p.o. intake.  For this reason, the patient was admitted by Dr. Everardo All to the  hospital.  Please see the dictated H&P for past medical history,  medications and physical exam at admission.   HOSPITAL COURSE:  Problem 1.  DEHYDRATION:  The patient was gently  hydrated and responded nicely to therapy.   Problem 2.  CHRONIC LIVER FAILURE:  The patient remained stable with no  decompensation, no ascites.   Problem 3.  CHRONIC RESPIRATORY FAILURE.  The patient was maintained on  6 L of oxygen with adequate O2 saturation.   Problem 4.  NEUROLOGIC:  The patient on March 29, 2007, did have an  apparent seizure.  Code stroke was called and  Dr. Sharene Skeans came to see  the patient.  He felt the patient had complex partial seizure with  secondary generalization.  It was felt she had a remote, nonhemorrhagic  lacunar infarction because she has an ill-defined lesion of the left  temporal lobe which did need further investigation.  The patient had  been initially loaded with Dilantin for seizure prophylaxis.  Dr.  Sharene Skeans saw the patient in followup.  EEG was performed revealing  bilateral temporal sharp waves.  There is no clear-cut epileptic focus  with background borderline 7-8 Hz changes with no focal slowing.  It was  felt the patient would be a good candidate to continue on Keppra which  had already been started IV and she was switched to oral Keppra on the  April 29.  She tolerated this well.  She had no further seizure activity  while in hospital.   With the patient being rehydrated with her GI and pulmonary problems  being stable, with her neurologic evaluation being completed and started  on medication, the patient was thought to be ready for  discharge home.  She had PT and OT evaluation and it was felt she needed no additional  followup and no durable medical equipment.   DISCHARGE PHYSICAL EXAMINATION:  GENERAL:  The patient is awake, alert,  lying in bed in no acute distress.  VITAL SIGNS:  Temperature 98.2, blood pressure was 86/44, heart rate was  68, respirations were 18.  HEENT:  Exam was unremarkable.  CHEST:  The patient is moving air well.  I appreciated no rales or  wheezes.  There is no increased work of breathing.  CARDIOVASCULAR:  2+  radial pulses, quiet precordium with regular rate and rhythm.  ABDOMEN:  Abdomen was soft.  No guarding or rebound.  NEUROLOGIC:  The patient is awake, alert, oriented to person, place,  time and context.  Cranial nerves were grossly intact.  The patient had  good motor strength being able to move all of her extremities in bed.  No further examination was conducted.   DISCHARGE LABORATORY DATA AND X-RAY FINDINGS:  L-methylmalonic acid 97,  which is within normal range.  Final INR 1.6.  Ammonia level from April  29, was 27 in normal range.  CBC from April 29, revealed a hemoglobin of  10.7 g, white count 2200, platelet count 61,000.  Basic metabolic panel  on April 29, revealed a sodium 142, potassium 3.3, chloride 114, glucose  was 82, BUN 3, creatinine 0.56.  The patient did have stool for C.  difficile on April 28, which was negative.  Magnesium level from April  28, was normal.  TSH from April 26, was normal at 3.265.  B12 was  performed on March 29, 2007, and was normal at 726.   DISCHARGE MEDICATIONS:  1. Protonix 40 mg q.a.m.  2. Propranolol 10 mg b.i.d.  3. Iron 325 mg daily.  4. Fosamax 70 mg weekly.  5. Folic acid 1 mg daily.  6. Vitamin E 1000 international units daily.  7. Glucosamine daily.  8. Keppra 500 mg p.o. b.i.d.   DISPOSITION:  The patient is discharged home.   FOLLOW UP:  She should follow up with her primary care physician, Dr. Birdie Sons in  10-14 days.  The patient is to follow up with Dr. Billie Ruddy for neurology in July 2008.  She should call the office for  appointment and can call in the interval if needed.   CONDITION ON DISCHARGE:  Stable and improved.      Rosalyn Gess Norins, MD  Electronically Signed     MEN/MEDQ  D:  04/03/2007  T:  04/03/2007  Job:  [id number]   cc:   Valetta Mole. Swords, MD  [address] [place]  [place] [address]   Deanna Artis. Sharene Skeans, M.D.  Fax: [phone]

## 2011-04-20 NOTE — Letter (Signed)
October 15, 2006    Barbaraann Share, MD,FCCP  [address]  [place], [place] [address]   RE:  [name], [name]  MRN:  [id number]  /  DOB:  1932-10-11   Dear Mellody Dance:   This is a followup on [name], date of birth [birth year].  She is  a 77 year old white female with advanced Laennec cirrhosis and portal  hypertension, history of ascites and encephalopathy.  You have been  evaluating her for increasing shortness of breath and raised the question of  possible systemic shunting causing decreased oxygen saturation in her lungs.   We have obtained Doppler study of her portal vein at [facility], which confirms arterial- portal shunting  as well as venous- portal shunting in the abdominal vessels.  Her portal  vein is enlarged at 16 mm.  She naturally has splenomegaly as well as pelvic  varices.  I feel that there is enough sonographic evidence to assume that  she has wide-spread shunting below the diaphragm, as well as above the  diaphragm.  As you know the TIPS procedure is at times effective at reducing  patients ascites by virtue of decreasing her port pressure.  Whether this  would be effective enough to improve her oxygenation in her lungs is not  clear.  The trade-off of TIPS procedure is increased encephalopathy, which  she already has had in the past, and when I mentioned it to the patient, she  was quite concerned about the possibility of having elevated ammonia as a  result of the TIPS procedure.  I think, if her  pulmonary condition deteriorates, I would suggest that you seek another  opinion in [facility] as far as the TIPS procedure is concerned, or  even portal caval shunt.  I would be happy to help you with it to arrange it  with the radiology department at [facility].  It is a pleasure to follow  this lady with you.    Sincerely,      Hedwig Morton. Juanda Chance, MD  Electronically Signed    DMB/MedQ  DD: 10/15/2006  DT:  10/15/2006  Job #: [id number]

## 2011-04-20 NOTE — Assessment & Plan Note (Signed)
[facility]                               PULMONARY OFFICE NOTE   CORRISSA, MARTELLO                      MRN:          [id number]  DATE:08/19/2006                            DOB:          Nov 27, 1932    SUBJECTIVE:  Ms. Conti come in today after her recent right and left  heart catheterization.  She was found to have non significant coronary  disease and adequate LV function.  Her right sided heart pressures showed  very minimal elevation in her pulmonary systolic with a pulmonary capillary  blood pressure anywhere from 13 to 16.  She comes in today for further  workup of her abnormal CT and her hypoxemia.  She continues to have  significant dyspnea on exertion, also a dry cough at times.   EXAMINATION:  GENERAL:  In general she is a well-developed white female in  no acute distress.  VITAL SIGNS:  Blood pressure 120/58.  Pulse 86.  Temperature 97.4  Weight is  138 pounds.  O2 saturation on 2 1/2 liters pulse is 79%.  On 2 liters  continuous it is 89%.   IMPRESSION:  Hypoxemia with pulmonary infiltrates of unknown etiology.  I  still remain very concerned about the distribution of the infiltrates.  They  are primarily in the dependent portion of the lung on the CT scan and are  associated with very large caliber vascular structures in the lower lobe.  I  am still not convinced this is an interstitial process and wonder if it  could be intrapulmonary shunting which is changing her flow and volume  relationships resulting in dependent edema.  She does not have pulmonary  hypertension by right heart cath data, but clearly the vessels are very  large in the dependent portion of the lungs.  I really think that we need to  totally exclude the possibility of intrapulmonary shunting prior to  considering subjecting her to either lung biopsy or empiric steroids.  It is  obvious that she is not going to be able to tolerate pulse oxygen and will  need  to increase that to 3 liters continuous.   PLAN:  1. We will schedule for TCD bubble study to rule out intrapulmonary      shunting.  If this is negative then we will have to make a decision      about treating her empirically for inflammatory lung disease with      steroids verses proceeding with video assisted thoracoscopic surgery      (VATS) lung biopsy for definitive diagnosis prior to initiating      steroids.  I am somewhat concerned about her fragility and underlying      liver disease, and perhaps the best course would be to give her trial      of the steroids and only do a lung biopsy if she has recurrence of her      disease after the steroids have been weaned off over a 4-8 week trial.  2. Will need blood work done consisting of ACE, ESR, RF, ANA if her TCD  bubble study is negative.                                   Barbaraann Share, MD,FCCP   [facility]/MedQ  DD:  08/19/2006  DT:  08/20/2006  Job #:  [id number]   cc:   Valetta Mole. Cato Mulligan, MD  Hedwig Morton. Juanda Chance, MD  Pricilla Riffle, MD, [facility]

## 2011-04-20 NOTE — Assessment & Plan Note (Signed)
[facility]                               PULMONARY OFFICE NOTE   Gina, Young                      MRN:          [id number]  DATE:06/12/2006                            DOB:          1932-12-01    REFERRING PHYSICIAN:  Dr. Lina Sar   HISTORY OF PRESENT ILLNESS:  The patient is a pleasant 77 year old female  who I have been asked to see for hypoxemia.  The patient in June of this  year presented to the emergency room with questionable GI bleeding and was  found to be hypoxemic.  I do not have the details of that evaluation or  admission but apparently she was sent home without oxygen after receiving  some type of treatment in the emergency room and getting her saturations  into the low 90s.  Patient has significant dyspnea on exertion that has been  ongoing.  She states that she will get dyspneic making a bed, but has no  problems vacuuming one room of her house or bringing in groceries.  She  cannot walk as fast as she used to be able to.  Patient does have an  occasional cough, but no mucus or chest congestion or wheezing.  She does  have chronic left lower extremity edema.  Patient apparently has had no  recent chest x-ray, but does carry a questionable diagnosis of mild  interstitial lung disease in the past that was felt to be RBILD and related  to smoking.  She does have a history of mild airflow obstruction in 2005.  She has not smoked in approximately six years.   PAST MEDICAL HISTORY:  1.  Significant for cirrhosis.  2.  Coagulopathy.  3.  Questionable history of interstitial lung disease in the past with PFTs      showing no restriction and a normal DLCO with alveolar volume      adjustment.  She did have very minimal airflow obstruction.   CURRENT MEDICATIONS:  1.  Multivitamin daily.  2.  Protonix 40 mg daily.  3.  Fosamax weekly.  4.  Various other supplements.   Patient has an intolerance to CODEINE.   SOCIAL  HISTORY:  She is married and has children.  She has a of smoking one  to one and a half packs per day for 30-40 years.  She has not smoked in  approximately six to seven years.  She lives with her husband and daughter.   FAMILY HISTORY:  Remarkable for arthritis, otherwise is noncontributory.   REVIEW OF SYSTEMS:  As per history of present illness.  Also, see patient  intake form documented in the chart.   PHYSICAL EXAMINATION:  GENERAL:  She is a well-developed white female in no  acute distress.  VITAL SIGNS:  Blood pressure 100/64, pulse is 100, temperature is 98.2,  weight is 132 pounds, O2 saturation room air is 85%.  HEENT:  Pupils are equal, round, and reactive to light and accommodation.  Extraocular muscles are intact.  Nares are patent without discharge.  Oropharynx is clear.  NECK:  Supple without JVD  or lymphadenopathy.  There is no palpable  thyromegaly.  CHEST:  Faint basilar crackles, otherwise is clear.  CARDIAC:  Regular rate and rhythm with a 2/6 systolic murmur.  ABDOMEN:  Soft, nontender, nondistended with good bowel sounds.  GENITAL:  Not done and not indicated.  RECTAL:  Not done and not indicated.  BREASTS:  Not done and not indicated.  EXTREMITIES:  Lower extremities shows swollen left lower extremity with 1+  edema and some calf tenderness.  Right lower extremity with trace edema.  NEUROLOGIC:  She is alert and oriented, moves all four extremities.   IMPRESSION:  Hypoxemia of unknown etiology.  The patient has a history of  very minimal airflow obstruction in the past and has quit smoking since that  time.  It is very unlikely this is a contributor to her hypoxemia.  She also  has a questionable history of interstitial lung disease related to smoking  and, again, she has discontinued this.  She had no restriction or  significant DLCO abnormality with correction on her last PFTs.  I suspect  this is not the etiology for her hypoxemia, but certainly have to  keep this  in mind.  She will need proper radiographic follow-up.  The other  possibility in a patient with cirrhosis is whether or not she could have the  hepatopulmonary syndrome with intrapulmonary shunting related to this.  This  would have to be picked up with a TCD bubble study.  Lastly, the patient has  never had a cardiac work-up and certainly we have to keep this in mind.   PLAN:  1.  Will order spiral CT as well as chest x-ray to evaluate her lung      parenchyma.  2.  Patient is to get on oxygen at 2 L per minute 24 hours a day.  Dependent      upon the results of spiral CT she may need further pulmonary work-up      and/or cardiac work-up.                                   Barbaraann Share, MD, FCCP   [facility]/MedQ  DD:  06/19/2006  DT:  06/19/2006  Job #:  [id number]   cc:   Valetta Mole. Swords, MD

## 2011-04-20 NOTE — H&P (Signed)
[name], [name]               ACCOUNT NO.:  [id number]   MEDICAL RECORD NO.:  [id number]          PATIENT TYPE:  EMS   LOCATION:  ED                           FACILITY:  [facility]   PHYSICIAN:  Lonzo Cloud. Kriste Basque, M.D. [facility] OF BIRTH:  August 11, 1932   DATE OF ADMISSION:  11/11/2004  DATE OF DISCHARGE:                                HISTORY & PHYSICAL   HISTORY OF PRESENT ILLNESS:  The patient is a 77 year old white female, a  relatively new patient of [name]. Swords, who first saw him about three  weeks.  She presents to the emergency room tonight with acute onset of  diarrhea earlier today followed by nausea and vomiting of a brownish  material and then followed by rectal bleeding with maroon, bright red blood.  She denied any abdominal pain.  She had no previous history of GI problems  and denies history of peptic ulcer disease, hiatal hernia, reflux,  diverticulosis, etc.  Indeed, the patient has never had a previous medical  evaluation prior to seeing [name]. Swords about three weeks ago for the  first time.   On that occasion, she presented with a cough, stating that a daughter had  returned from a trip with an upper respiratory tract infection and that I  caught it.  She apparently had trouble shaking the cough and eventually  saw [name]. Swords for evaluation.  She had an abnormal chest x-ray and  subsequent CT scan that showed patchy airspace disease bilaterally.  The  pattern was nonspecific and suggested possible pneumonia and bronchiolitis  obliterans.   The patient was seen in consultation by [name] and placed on  Tequin and prednisone.  She states she has been feeling better from the  respiratory standpoint until the recent onset of diarrhea, nausea, vomiting,  and bright red blood per rectum as noted.   Reviewing available records in the [facility] system, she had lab work dating back  to October 24, 2004 which showed a pattern compatible with  cirrhosis of the  liver.  Her very first lab studies showed a bilirubin of 4, alkaline  phosphate of 278, SGOT 117, and SGPT 26.  On that same occasion, her  hemoglobin was 11, white count 4900 and her MCV was 123.  Her pro time was  17.7 with an INR of 2.3.  Her potassium was 2.7.  She had been given some  potassium and recently three days of vitamin K.  She denies any knowledge of  chronic liver disease.  She states that she has been a long-time Vodka  drinker, usually one to two drinks in the evening.  She has never had any  problems from this.   She was seen in the emergency room by [name] of the ER staff.  He  performed a rectal exam and found maroon and bright red blood per rectum and  I was called for admission.  He noted initial blood pressure of 90, pulse 90  and regular, hemoglobin 7.5, and pro time 16.6 with an INR of 1.5.  She was  referred for admission.   PAST MEDICAL HISTORY:  1.  As noted above, the patient denies ever seeing a family doctor or      gynecologist in the past.  She has never had a previous medical      evaluation.  She has never had a previous mammogram and has not had a      Pap smear in many years.  She has never had a colonoscopy.  2.  She states that she fell about three years ago and fractured her left      femur.  This was treated with a soft cast by Dr. [name]. Norris and no      surgery was required.  She denies having a bone density, etc. She denies      ever having had surgery.   MEDICATIONS:  No medications prior to the recent Tequin, prednisone, KCl,  and vitamin K.   FAMILY HISTORY:  The patient's father died at age 73 of congestive heart  failure.  The patient's mother died at age 80 of congestive heart failure.  She is an only child without any siblings.   SOCIAL HISTORY:  The patient has been married for the last 45 years to her  husband [name].  Their phone number is [phone].  The couple have three  children-all of whom  are alive and well.  She is a smoker but only smokes  one cigarette a month at the present time.  I quizzed her about her alcohol  history.  She has been a long time Vodka drinker.  She and her husband will  have one or two Vodkas every evening on most days.  He goes to [place] to  get the Vodka cheaper than they can get locally.  When they travel, they  will distribute the Vodka into different containers for transportation.  She  denies ever having had a problem with alcohol.   REVIEW OF SYMPTOMS:  The patient denies headache or visual symptoms.  She  denies sore throat or hoarseness.  She denies any recurrent cough or sputum  production.  Has not had hemoptysis.  She denies chest pain or palpitations.  She denies any cardiac history.  She denies any GU symptoms.  She denies any  arthritic complaints.  There is no history of stroke or seizure.  She has  noted some slight bruising and states this why she was given the vitamin K.   PHYSICAL EXAMINATION:  GENERAL:  An elderly white female, chronically ill-  appearing, and in no acute distress.  VITAL SIGNS:  Blood pressure 90/45 (blood pressure improved to a 100 in the  ER), pulse 90 per minute and regular, respirations 20 per minute and not  labored.  O2 saturation 83% on room air.  Temperature 98.8.  HEENT:  The patient has mild scleral icterus.  Mucus membranes are dry.  Could not see the fundi well.  NECK:  No jugular venous distention, no carotid bruits, no thyromegaly, or  lymphadenopathy.  CHEST:  Clear to auscultation and percussion.  There were no wheezes, rales,  or rhonchi heard.  HEART:  Regular rhythm.  Grade 1/6 systolic ejection murmur in the left  sternal border.  No rubs or gallops detected.  ABDOMEN:  Slightly protuberant with intact bowel sounds.  There was mild  epigastric discomfort on palpation and liver and spleen tips were palpable  below the costal margin.  No masses detected. EXTREMITIES:  No clubbing,  cyanosis, or  edema.  NEUROLOGICAL:  Intact without focal abnormalities detected.  DERMATOLOGIC:  Revealed some bruising and ecchymoses.   IMPRESSION:  1.  Rectal bleeding:  The patient presents with maroon and bright red rectal      bleeding likely from upper gastrointestinal source.  Possibly of varices      is raised.  Certainly, she could have some gastritis or ulcers.      Gastroenterology has been consulted for esophagogastroduodenoscopy      and/or colonoscopy as soon as possible.  2.  Anemia with gastrointestinal bleeding:  The anemia is certainly      multifactorial.  Her initial hemoglobin was 11 with macrocytic indices.      Her folate level was low.  She will be typed, crossed, and transfused      two units tonight.  We will give her a shot of vitamin K and place her      on oral folate and vitamins.  3.  Probable alcoholic cirrhosis:  Initial presentation suggest this      diagnosis with the additional findings on the CT scan.  This will also      be addressed by gastroenterology with liver biopsy when appropriate.  4.  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  We will check her serum cortisol this morning      and follow her x-rays.      SMN/MEDQ  D:  11/11/2004  T:  11/11/2004  Job:  [id number]

## 2011-04-20 NOTE — Op Note (Signed)
NAMEMYKELA, MEWBORN               ACCOUNT NO.:  [id number]   MEDICAL RECORD NO.:  [id number]          PATIENT TYPE:  INP   LOCATION:  0153                         FACILITY:  [facility]   PHYSICIAN:  Jordan Hawks. Elnoria Howard, MD    DATE OF BIRTH:  [birth year]   DATE OF PROCEDURE:  11/10/2004  DATE OF DISCHARGE:                                 OPERATIVE REPORT   PROCEDURE:  Esophagogastroduodenoscopy.   INDICATION:  For upper GI bleed and significant anemia.   PRIMARY CARE Taeko Schaffer:  Judie Petit, M.D.   CONSENT:  Informed consent was obtained from the patient describing the  risks of bleeding, infection, perforation, medication reactions and the risk  of death, all of which are not exclusive of any other complications that may  occur.   PHYSICAL EXAM:  CARDIAC:  Regular rate and rhythm with a 2/6 systolic  ejection murmur.  LUNGS:  Clear to auscultation bilaterally.  ABDOMEN:  Soft, nontender, nondistended.   MEDICATIONS:  1.  Fentanyl 50 mcg IV.  2.  Versed 4 mg IV.   PROCEDURE:  After adequate sedation was achieved, the endoscope was advanced  from the oral cavity into the esophagus.  Upon slow entry into the  esophagus, the patient is noted to have a non-obstructive Schatzki's ring in  the distal esophagus.  There is no evidence of any varices or esophagitis.  Further advancement of the endoscope into the stomach revealed a mild portal  hypertensive gastropathy.  Retroflexion was negative for any evidence of  fundic varices.  No evidence of blood within the gastric lumen.  In the  distal portion of the stomach, the patient is noted to have a deformed  pylorus with significant edema.  Close inspection of this area revealed two  ulcers, one is a 5-8 mm ulcer with a clean base, which was difficult to  visualize secondary to edema, and a secondary ulcer measuring approximately  1-2 cm in size and a visible vessel as well as a clean base was noted.  There is not clot on this visible  vessel, no active bleeding was seen.  However, a significant amount of edema did preclude adequate visualization  of the entire ulcer.  Because of the edema, treatment was difficult to  deploy.  A four quadrant of epinephrine injection of 1:10,000 solution, with  each quadrant receiving 1 mL, was injected in the periphery of the ulcer.  Subsequently, BICAP was applied to the visible vessel; however, this was  felt to be suboptimal in its placement because the edema continued to be  problematic in this region.  However, two applications adjacent or partially  on the visible vessel was obtained, no evidence of active bleeding after  applying the BICAP.  It was felt that the BICAP may have been suboptimal,  two attempts were made with deploying hemostatic clips, both of which failed  to deploy properly.  Once clip misfired and a tri-clip was not deployed in a  proper manner and, therefore, was ineffective.  Both ulcers were benign in  appearance and there is no evidence of any malignancy.  Because of the  bleeding, no biopsies were taken.  Entry into the duodenum was negative for  any duodenitis or ulcerations with in this area.  After close surveillance  of the entire upper gastric lumen, the endoscope was then withdrawn from the  patient and the procedure was terminated.  The patient tolerated the  procedure well and no complications were encountered.   1.  Start Protonix IV 8 mg per hour with an 80 mg IV bolus.  2.  Follow her H&H closely.  3.  Continue workup in regards to her liver issues.       PDH/MEDQ  D:  11/11/2004  T:  11/12/2004  Job:  [id number]   cc:   Valetta Mole. Swords, M.D. [facility]

## 2011-04-20 NOTE — Assessment & Plan Note (Signed)
[name]                              CARDIOLOGY OFFICE NOTE   [name], [name]                      MRN:          [id number]  DATE:07/22/2006                            DOB:          05-07-1932    IDENTIFICATION:  The patient is a 77 year old lady whom I saw for the first  time back in late July.  She was referred by Barbaraann Share, MD, for  evaluation of shortness of breath and hypoxia.   I went ahead and set her up for a cardiac catheterization.  She had this  done on July by Micheline Chapman, MD.  Her hemodynamics showed a pulmonary  arterial pressure of 31/6, pulmonary capillary wedge pressure of a mean of  13.  There was no evidence for oxygen step-up to suggest a shunt.  LVF was  65-70%.  The proximal LAD had a 20% narrowing.  Otherwise there was on  significant artery disease.   The patient also had an echocardiogram that showed normal LV size and  function with an LVF of 65%.  There was mild mitral regurgitation noted.   Since seen, the patient has done about the same.  She notes no problems with  her leg.   CURRENT MEDICATIONS:  1. Protonix 40 mg daily.  2. Ferrous sulfate 325 mg daily.  3. Lorazepam two q.h.s.  4. Glucosamine 1.5 g daily.  5. Fosamax each week.  6. Folic acid.  7. O2 2 L.  8. Multivitamin.  9. Vitamin D.   PHYSICAL EXAMINATION:  GENERAL:  The patient is in no distress.  VITAL SIGNS:  Blood pressure 118/50, pulse is 85, weight 138.  Patient on  oxygen.  LUNGS:  Moving air well, faint basilar crackles.  CARDIAC:  Regular rate and rhythm, S1, S2, a grade 1/6 systolic murmur heard  best at the apex.  ABDOMEN:  Benign.  EXTREMITIES:  Good pulses.  Right groin without hematoma.  No bruit.   IMPRESSION:  Status post left heart catheterization.  No evidence for  intracardiac shunt or pulmonary hypertension.  Catheterization without  complications.  No definite follow-up is set.  I  will confirm that the  patient's records have been received in pulmonary for  continued follow-up, and she has an appointment there.                                Pricilla Riffle, MD, [facility]    PVR/MedQ  DD:  07/22/2006  DT:  07/22/2006  Job #:  [id number]   cc:   Barbaraann Share, MD, [facility]

## 2011-04-20 NOTE — Letter (Signed)
November 06, 2006    Larey Dresser, MD  Associate Professor of Medicine  Division of Gastroenterology and Liver Disease  [facility] [id number], [place] [address]   RE:  [name], [name]  MRN:  [id number]  /  DOB:  Jul 15, 1932   Dear Dr. Katrinka Blazing:   [name] has an appointment to see you for evaluation of possible  TIPS procedure.  Her records have been forwarded to your office .  Ms.  Young is a 77 year old white female who has Laennec cirrhosis, which  has been quite advanced.  She has had signs of portal hypertension  including ascites at her initial presentation in November 2005, when she  required hospitalization.  She at that point already had splenomegaly  and upper GI bleed attributed  to  an antral ulcer.  I have been  following her for her liver disease, which has really not progressed  beyond the initial presentation.  In the summer of 2007, she complained  to me of increasing shortness of breath.  She is a former smoker and had  a diagnosis of COPD.  I referred her to a pulmonary specialist and she  saw Dr. Marcelyn Bruins in July 2007.  He ordered a spiral CT scan, put her  on home oxygen at 2 L a minute and did a TCD bubble study which showed  severe shunting in her lungs.  Cardiac evaluation was done including  cardiac catheterization, which did not show any cardiac causes for her  shunting.  Dr. Shelle Iron asked me again to evaluate her portal hypertension  as to whether it could cause systemic shunting in her lungs and  increasing shortness of breath.  The Doppler studies of her portal vein  showed that she has enlarged portal vein to 16 mm, splenomegaly, pelvic  varices, but on the last endoscopy, did not have esophageal varices;  this was in March 2006.  Since last summer, [name]'s shortness of  breath has increased dramatically; she is now on home oxygen of 6 L/min.  After talking to Dr. Shelle Iron, he suggested that we try to reduce her  portal pressure in the hope  that it could reduce her pulmonary shunting.  Since I have never considered a TIPS procedure or portal caval shunt for  this sort of indication, I am hesitant to make any decision with this  respect.  She currently does not have any ascites.  She has only mild  coagulopathy and minor elevation in liver function tests.  Her platelet  count is 89,000 with INR of 1.6 and total bilirubin of 1.4.  Also her  ammonia, which was 19 in the summer, is now 69.   I have spoken to [name]'s family, who desires to get another  opinion as to possible treatment of her portal hypertension with a shunt  in a hope that it would eleviate her pulmonary shunting.  I would  appreciate your opinion regarding this .   Thank you very much for your assistance.    Sincerely,      Gina Morton. Juanda Chance, MD  Electronically Signed    DMB/MedQ  DD: 11/06/2006  DT: 11/07/2006  Job #: [id number]

## 2011-04-20 NOTE — Procedures (Signed)
EEG NUMBER:  [id number].   CLINICAL HISTORY:  A 74-year lady being evaluated for seizures.   MEDICATION LIST:  Protonix, Inderal, sodium chloride, folic acid,  Keppra, Zofran and Ambien.   This is a portable EEG recording the patient in the awake state.   Background awake rhythm consists of 10-11 Hz alpha which is of a  moderate amplitude, synchronous, reactive to eye opening and closure.  Intermittent sharp activity and theta slowing is seen bilaterally in the  posterior temporal regions, slightly more prominent on the right than  left.  At times, this acquires a semirhythmic quality.  However, no  generalized seizure activity is noted.  Sleep stages are not seen in  this tracing.  Technical component is average with excessive muscle  artifacts toward the end of the tracing.  Hyperventilation is not  performed.  Photic stimulation is also not performed.  EKG tracing  reveals regular sinus rhythm.   IMPRESSION:  This EEG performed during awake state is abnormal due to  presence of bilateral temporal cortical irritability.           ______________________________  Sunny Schlein. Pearlean Brownie, MD     GUR:KYHC  D:  03/31/2007 19:45:47  T:  04/01/2007 08:51:52  Job #:  [id number]

## 2011-04-20 NOTE — Consult Note (Signed)
[name], [name]               ACCOUNT NO.:  [id number]   MEDICAL RECORD NO.:  [id number]           PATIENT TYPE:   LOCATION:                                 FACILITY:   PHYSICIAN:  Jordan Hawks. Elnoria Howard, MD         DATE OF BIRTH:   DATE OF CONSULTATION:  11/11/2004  DATE OF DISCHARGE:                                   CONSULTATION   REASON FOR CONSULTATION:  GI bleed.   PRIMARY CARE Lynx Goodrich:  Dr. Laruth Bouchard Swords.   ADMITTING DIAGNOSIS:  Upper gastrointestinal bleed.   HISTORY OF PRESENT ILLNESS:  This is a 77 year old white female without past  medical history, presents with hematochezia and nausea and vomiting.  The  patient states that prior to evaluation in the emergency room she suffered  what she considered as being sick.  At that time, that was approximately 5  p.m. and she had vomited a large amount of brownish fluid.  She denies  having any hematemesis.  The patient subsequently felt better after the  vomiting, however, at approximately 12:15 a.m., she had another attack, but  this time the vomiting was less and she developed hematochezia which is new  for her.  She denied having any types of abdominal pain at this time and  subsequently this brought her to the emergency room.  While in the emergency  room, the patient was noted to have a hemoglobin of 7.5 which is a decrease  from 11.9 on 10/24/04.  Prior to this time, she was seen at [facility] for complaints of long difficulties.  She was noted to have the  possibility of BOOP or a pneumonitis and she was given Tequin as well as  prednisone for her lung symptoms.  She had improved until one to two days  prior to this admission.  Previous to this time, the patient denies having  any health needs.  Overall she states that she has been in good health and  has not required any types of medications.  During the initial evaluation at  [facility], the patient was also noted to have abnormal  transaminases with an alkaline phosphatase of 278, AST of 117, and an  albumin of 2.6.  PT was elevated at 17.7 with an INR or 2.3.  She was  provided with vitamin K, to help correct this issue and with subsequent  followup.   PAST MEDICAL HISTORY:  Is as stated above.   PAST SURGICAL HISTORY:  Is as stated above.   MEDICATIONS:  1.  Tequin.  2. Prednisone.  The patient finished prednisone yesterday.   ALLERGIES:  No known drug allergies.   FAMILY HISTORY:  Noncontributory for current disease.  She denies any family  members having any pulmonary or liver problems.  Her mother had died at the  age 41 secondary to heart failure.   SOCIAL HISTORY:  The patient lives with her husband and has good social  support.  She does admit to having a significant history of alcohol use,  however, she denies drinking for the  past six weeks.  Previous to this time  she did drink vodka.  She does have a history of drinking heavily, however,  she denied any prior chemical dependence to alcohol.  The patient also has a  distant history of tobacco abuse.   PHYSICAL EXAMINATION:  Vital signs:  Blood pressure is 87/50, heart rate is  in the low 100s.  The patient is afebrile and with a pulse oximetry in the  90s.  The patient is afebrile.  Generally, the patient is in no acute  distress, alert and oriented.  HEENT:  Normocephalic, atraumatic.  Extraocular muscles intact.  Pupils equal and round, reactive to light.  Neck is supple.  No lymphadenopathy.  Lungs are clear to auscultation  bilaterally.  Cardiovascular:  Regular rate and rhythm with a 2/6 systolic  ejection murmur.  Abdomen is flat, soft, non tender, nondistended.  No  hepatosplenomegaly.  No evidence of any ascites.  Extremities:  No clubbing,  cyanosis, or edema.  No evidence of asterixis.  Skin:  There is no evidence  of any palmar erythema or spider angiomas.   LABORATORY VALUES:  On 11/11/04, WBC count 13.3, hemoglobin 7.5, platelets   at 167,000.  MCV is 115.1, alkaline phosphatase is 108, AST is 50, ALT 27,  albumin 2.2, PT is 16.6, INR is 1.5.  On 10/24/04, the WBC count was 4.9,  hemoglobin 11.9, platelets at 115,000.  MCV is 122.7, alkaline phosphatase  278, AST 117, albumin 2.6, PT is 17.7, INR is 2.3.  CT scan of the abdomen  reveals the possibility of a pneumonitis vs BOOP.  There is also a  suggestion of cirrhosis of the liver and evidence of a small amount of  ascites with mild splenomegaly.   IMPRESSION:  1.  GI bleed, upper GI vs lower GI.  2. Possible cirrhosis.  3. Coagulopathy      secondary to #2.  4. Pneumonitis vs BOOP.  5. Macrocytosis.  After      extensive interviewing of the patient, it is uncertain at the time as to      the cause of the GI bleeding, however, it is apparent that she does have      a significant coagulopathy.  She does have a long history of alcohol use      and it may be that she has developed cirrhosis secondary to the alcohol,      however, in light of the lung abnormalities, inherited the disease such      as alpha 1 antitrypsin needs to be investigated.  Contrary to this      diagnosis is that the patients typically do not develop BOOP with alpha      1 antitrypsin disease.  An EGD should be performed for further      evaluation of any varices or other sources of upper GI bleed, however,      if this is negative, a lower GI source needs to be evaluated given the      acuity of the situation.  It is prudent to subject the patient to two      separate procedures rather than two do procedures at the same time as      time is of the essence.  The patient's macrocytosis can be secondary to      the cirrhosis, however, her MCV is markedly elevated at 122.7 on initial      evaluation on 10/24/04.  Other considerations are a pernicious anemia  which is typically seen at this high level MCV, however, this can be     multifactorial with alcohol use and possibly malnutrition with a       decrease in amount of folate.   PLAN:  At this time.  1. Is to perform an emergent EGD.  If findings are  negative, colonoscopy needs to be pursued as her hematochezia history is  consistent with a diverticular bleed.  2. CT scan of the abdomen after a  colonoscopy, if this is needed.  3. Check alpha 1 antitrypsin, ANA, AMA,  ASMA, hepatitis C, hepatitis B, vitamin B12 and folate levels.  Iron levels  as well as ferritin should not be checked at this time as she is status post  blood transfusions.  4. Continue to monitor her hematocrit and hemoglobin as  well as her coagulopathy and to treat as needed.       PDH/MEDQ  D:  11/11/2004  T:  11/12/2004  Job:  [id number]   cc:   Valetta Mole. Swords, M.D. [facility]   [facility]

## 2011-04-20 NOTE — Consult Note (Signed)
[name], [name]               ACCOUNT NO.:  [id number]   MEDICAL RECORD NO.:  [id number]          PATIENT TYPE:  INP   LOCATION:  1419                         FACILITY:  [facility]   PHYSICIAN:  Deanna Artis. Hickling, M.D.DATE OF BIRTH:  June 05, 1932   DATE OF CONSULTATION:  03/29/2007  DATE OF DISCHARGE:                                 CONSULTATION   CHIEF COMPLAINT:  New onset of seizures.   HISTORY OF PRESENT CONDITION:  I was asked to see [name].  I was  paged code stroke at 1819 hours.  Speaking with the nurse, it was  clear that the patient had had a seizure and that this was not a stroke.  She had recovered from her event and gone down to the CT scanner.  An  ill-defined lesion in her  left temporal lobe and also a remote right  basal ganglia stroke was seen.  On the basis of this, despite the fact  the patient had no focal neurologic deficits code stroke was called.   The patient is a 77 year old right-handed woman with history of advanced  alcoholic cirrhosis.  She has had ascites in the past (since she was  diagnosed to 2005, portal hypertension, splenomegaly, and pelvic  varices.  She has also had an upper GI bleed attributed to an antral  ulcer but does not have esophageal varices.  The patient has a very  significant shortness of breath attributed to COPD but also has a  significant right to left shunt at the pulmonary level based on a TCD  bubble study.  I do not know if this shunting is occurring through a  patent foramen ovale.   The patient requires home oxygen at a dose of 6 liters per minute.  She  is oxygen dependent.  She also has gastroesophageal reflux disease and  has low back pain.   The patient was admitted to [facility] with a history of  polyuria, diarrhea, decreased oral intake, and paresthesias in her feet.   She was thought to have a viral syndrome and possibly be dehydrated;  however, laboratory studies failed to show this.  S  BUN  6, creatinine 0.53.  The patient's albumin was 3.1 and this dropped  to 2.5.  She has a total bilirubin of 3.8, prothrombin time 19.4, PTT  46.  As best I know, an ammonia level has not been done on this  hospitalization.  The patient has leukopenia with a white count of 3200,  s megaloblastic anemia.  Hemoglobin 10.4, MCV 102.2, and a platelet  count of 72,000.  TSH is 3.265 in the normal range.  Vitamin B12 is 726.  Red blood cell folic acid has not been drawn.   Tonight around 5:05 p.m. she suddenly screamed out, extended her arms in  the air, and then brought them in tightly clenched fists to her chest  wall, where she experienced jerking movements that lasted for about a  minute and she had foaming from the mouth, may have bitten her tongue,  and cyanosis.  The episode lasted for 1-2 minutes in duration.  She was  postictal for at least about 20 minutes.  She was not able to answer  questions until around 5:40 p.m.Marland Kitchen  She was taken to the CT scanner where  she had a CT of the brain noncontrast, which showed a remote right basal  ganglia infarction, ill-defined lesion in the temporal lobe, white  matter which may be atherosclerotic versus neoplastic.  She had a second  seizure at 7:10 p.m. that lasted 1-2 minutes in duration and was five  minutes postictal.  I had ordered 5 mg of Keppra and that discontinued  and Dilantin (which is metabolized by the liver also Aggrenox which is  metabolized by the liver).  Keppra had been given to her.  I ordered a  second dose of 500 mg on my arrival after I learned of the second  seizure.   The patient never showed evidence of focal neurologic deficits.  I do  not believe that she had a stroke acutely.  She may have had a remote  stroke.   PAST MEDICAL HISTORY:  1. Remarkable for advanced alcoholic cirrhosis.  She is on the      transplant list at [facility].  2. She has portal hypertension.  3. Splenomegaly.  4. Pelvic varices.  5. History of  ascites.  6. Coagulopathy.  7. In December 2007 she had ammonia of 59.  She also has low albumin      and slight elevated bilirubin.  Her transaminases are normal.  8. She has gastroesophageal reflux disease.  9. Osteoporosis.  10.As mentioned above she has unusual lung disease with right to left      shunt that may be as a result of her portal hypertension.   The patient has not had prior history of stroke or seizures.   PAST SURGICAL HISTORY:  Tonsillectomy.   REVIEW OF SYSTEMS:  CONSTITUTIONAL:  The patient has had decreased  appetite.  She has had recent diarrhea.  She had a fracture of her  pelvis in two places in July 2006.  She has osteoarthritis and this may  be a cause for her low back pain.   A 12-system review is otherwise negative.   CURRENT MEDICATIONS:  1. Protonix 40 mg daily.  2. Propranolol 10 mg twice daily.  3. Zolpidem 10 mg at bedtime.  4. Ferrous sulfate 325 mg once daily.  5. Fosamax weekly.  6. Folic acid 1 mg daily.  7. Vitamin E.  8. T alpha 1000 units once daily.   She has also been given Zofran 4 mg every six hours as needed.  Fosamax  has been held.  Ambien was switched to Halcion 0.5 mg as needed for  sleep.  She says that codeine is an allergy but basically it appears to  be an intolerance causing nausea.   SOCIAL HISTORY:  The patient is retired.  She lives with her husband.   FAMILY HISTORY:  Both parents died of congestive heart failure.   PHYSICAL EXAMINATION:  On examination today this is a very pleasant  woman, awake, mildly confused, in no acute distress.  VITAL SIGNS:  Blood pressure 105/54, resting pulse 83, respirations 20,  temperature 98.4, oxygen saturation 98% on 6 liters of oxygen.  HEENT:  Shows wax, no infections.  NECK:  Supple neck.  No bruits.  LUNGS:  Clear.  She is in no distress.  ABDOMEN:  Soft.  Bowel sounds normal.  I did not palpate a spleen or a  liver.  She has  no ascites. EXTREMITIES:  No edema.  She  complains of low back pain.  Negative  straight leg raising.  NEUROLOGIC:  Awake.  She has mild confusion.  No dysphasia.  She has  mild dyspraxia.  Cranial nerves:  Round reactive pupils.  Fundi normal.  Visual fields full.  Extraocular movements full.  Symmetric facial  strength.  Midline tongue and uvula.  Air conduction greater than bone  conduction bilaterally.   Motor examination:  The patient shows excellent strength in her arms and  legs.  Fine motor movements are normal.  Sensation:  Peripheral  polyneuropathy to the mid calf.  Slightly decreased vibration.  Normal  proprioception and good stereoagnosis.  Cerebellar examination:  Good  finger-to-nose, rapid finger movements.  Gait not tested.  Deep tendon  reflexes were normal in the upper extremities and knees, absent at the  ankles.  She had bilateral flexor plantar responses.   IMPRESSION:  1. Complex partial seizures with secondary generalization.  345.40.      345.10.  2. Remote nonhemorrhagic lacunar infarction.  434.01.  3. Ill-defined lesion of the left temporal lobe which needs to be      investigated  4. Advanced alcoholic cirrhosis with a multiple complications.  5. Gastroesophageal reflux disease.  6. Osteoporosis with history of fractures.  7. Obstructive pulmonary disease with right to left intrapulmonary      shunt.   PLAN:  The patient needs an MRI scan of the brain without and with  contrast.  She also needs an EEG.  Finally she needs to placed on Keppra  which is virtually entirely metabolized by the kidneys at a dose of 500  mg three times a day.  We may need to go higher in order to control her  seizures.  I want to avoid medications that are metabolized by the liver  if at all possible.  This would include medicines like Tegretol,  Depakote, Dilantin, and virtually all other antiepileptic medicines  other than Neurontin.   I appreciate the opportunity to participate in her care.  We will obtain  an  ammonia with her morning laboratories.  Keppra levels cannot be  obtained and even if they could be obtained, they are not useful as far  as titrating her medication.  I appreciate the opportunity to  participate in her care.  I have had an opportunity to assess her case  at length with her family.      Deanna Artis. Sharene Skeans, M.D.  Electronically Signed     WHH/MEDQ  D:  03/29/2007  T:  03/30/2007  Job:  [id number]   cc:   Gregary Signs A. Everardo All, MD  [address]  [place]  [place] [address]   Hedwig Morton. Juanda Chance, MD  [address]  [place]  [place] [address]   Barbaraann Share, MD,FCCP  [address]  [place]  [place] [address]

## 2011-04-20 NOTE — Assessment & Plan Note (Signed)
HEALTHCARE                           GASTROENTEROLOGY OFFICE NOTE   LINSY, EHRESMAN                      MRN:          [id number]  DATE:09/27/2006                            DOB:          June 06, 1932    Ms. Sarnowski is a 77 year old white female with advanced Laennec cirrhosis,  portal hypertension, history of ascites and elevated ammonia.  I have been  following her since her hospitalization for decompensated cirrhosis in  November 2005.  She has known splenomegaly and coagulopathy.  She also had a  gastric ulcer on upper endoscopy in November 2005.  She is an ex-smoker.  She has been evaluated by Dr. Shelle Iron because of shortness of breath.  She  also had a cardiology evaluation.  Dr. Shelle Iron sent her to the [facility]  because of issue of shunting and question as to whether her portal  hypertension is severe enough to cause AV shunting elsewhere in her body  causing decrease oxygen saturations, also question of whether decreasing her  portal height pressure by either medications or surgical shunt could improve  the condition.   Ms. Lantigua has done medium as far as her liver disease is concerned, the  ascites has not accumulated.  Her mentation has been normal, and there has  been no jaundice.   MEDICATIONS:  1. Protonix 40 mg a day.  2. Ferrous sulfate.  3. Lorazepam 1 mg two at bedtime.  4. Glucosamine.  5. Fosamax.  6. Folic acid.  7. Vitamin D.  8. Oxygen at 2 liters nasal prongs.   PHYSICAL EXAMINATION:  Blood pressure 110/58, pulse 64, weight 134 pounds,  which represents 10-pound weight gain since her last appointment in June  2007.  She was alert and oriented, with nasal oxygen.  There was no asterixis.  LUNGS:  With decreased breath sounds, no wheezes or rales.  Cor with rapid S1, S2.  ABDOMEN:  Soft, no ascites or fluid wave.  Liver edge was 1 or 2 cm below  right costal margin, and was nontender and somewhat down.  Overall  span was  normal.  Splenic tip in left upper quadrant, lower abdomen was normal.  There was no fluid wave, no collateral venous circulation.  EXTREMITIES:  Trace edema.   IMPRESSION:  A 77 year old white female with severe portal hypertension due  to Laennec cirrhosis, splenomegaly, coagulopathy.  She likely has systemic  shunting due to her increased portal pressure.   PLAN:  1. Color flow Doppler studies to assess the flow through the portal and      splenic veins.  2. Alpha-fetoprotein and liver function tests, prothrombin time as well as      venous ammonia today.  3. Start Inderal 10 mg twice a day to lower the blood pressure.  I have      discussed Inderal dose with the patient because of the risk of possible      orthostatic changes and dizziness.  If she cannot tolerate this beta      blocker, we will have to cut back on the dose.     Hedwig Morton.  Juanda Chance, MD    DMB/MedQ  DD: 09/27/2006  DT: 09/29/2006  Job #: [id number]   cc:   Barbaraann Share, MD,FCCP

## 2011-04-20 NOTE — Cardiovascular Report (Signed)
[name], [name] NO.:  [id number]   MEDICAL RECORD NO.:  [id number]          PATIENT TYPE:  OIB   LOCATION:  1962                         FACILITY:  [facility]   PHYSICIAN:  Micheline Chapman, MD   DATE OF BIRTH:  [birth year]   DATE OF PROCEDURE:  07/01/2006  DATE OF DISCHARGE:                              CARDIAC CATHETERIZATION   CARDIAC CATHETERIZATION REPORT   This is a cardiac catheterization performed by myself with Dr. Simona Huh  as the proctoring physician.   INDICATIONS:  [name] is a very pleasant 77 year old woman with  chronic liver disease and hypoxemia as well as dyspnea.  She was referred  for a right and left cardiac catheterization to rule out coronary disease as  well as pulmonary hypertension or intracardiac shunt.   Risks and indications were explained to the patient and her family, who  fully understood.  Informed consent was obtained.  The right groin was prepped and draped in normal sterile fashion.  The right  groin was anesthetized with 1% Lidocaine and right femoral arterial and  venous access were obtained via the modified Seldinger technique with a 5-  French arterial sheath and a 7-French venous sheath.   CATHETERS USED:  Swan-Ganz catheter for right heart catheterization and a 4-  French angled pigtail JL-4 and 3DRC.   PROCEDURE:  Right heart catheterization, left heart catheterization, left  ventricular angiogram and selective coronary angiogram.   FINDINGS:  Hemodynamics are as follows:  Right atrial pressure tracing  showed A-wave of 11 and V-wave of 9 and mean pressure of 8.  RV pressure was  31/9.  Pulmonary arterial pressure was 31/6 with mean of 23 mmHg.  Pulmonary  capillary wedge pressure showed an A-wave of 17, V-wave of 16 and mean  pressure of 13.  LV pressure was 113/9, aortic pressure 102/51 with mean of  75.  Oxygen saturations were 70% in the SVC and 76% in the IBC, 74% in the  pulmonary artery and 84%  in the aorta.  Fick cardiac output calculated 8.2  liters per minute with cardiac index of 5.2.   LEFT VENTRICULAR ANGIOGRAPHY:  Demonstrated normal left ventricular systolic  function with estimated LVEF of 65-70%.   SELECTIVE CORONARY ANGIOGRAPHY:  Demonstrated normal left main stem.  There  was mild luminal irregularity in the proximal LAD with a 20% proximal  stenosis.  The remainder of the left anterior descending system was normal.  It gave off 2 diagonal branches with no angiographic disease.  The left  circumflex system showed that the left circumflex gave off a large first  obtuse marginal branch with no significant disease and there were 2 lower  obtuse marginals of medium caliber that were also free of disease.  The AV  circumflex had not angiographic disease.   RIGHT CORONARY ARTERY:  The right coronary artery gave off a bifurcating RV  marginal branch and was a dominant vessel that branched into an posterior AV  segment and PDA branch.  There was a large AV nodal artery present.  The  posterior AV segment branched into  2 relatively small posterolateral  branches.  There was no significant coronary artery disease in the RCA  distribution.   ASSESSMENT:  1.  Mild isolated coronary plaque disease.  2.  Normal left ventricular function.  3.  Normal right heart pressures.  4.  Hypoxemia most likely secondary to parenchymal lung disease.      Micheline Chapman, MD  Electronically Signed     MDC/MEDQ  D:  07/01/2006  T:  07/01/2006  Job:  [id number]   cc:   Pricilla Riffle, M.D.  Marcelyn Bruins, M.D. [facility]

## 2011-04-20 NOTE — Assessment & Plan Note (Signed)
[facility]                               PULMONARY OFFICE NOTE   JAELEEN, INZUNZA                      MRN:          [id number]  DATE:09/26/2006                            DOB:          [birth year]    SUBJECTIVE:  Ms. Blubaugh comes in today for followup after her recent  evaluation of her positive transcranial Doppler mobile study.  She underwent  right and left heart catheterization which showed minimally elevated  pulmonary artery pressures and a pulmonary capillary wedge pressure on the  report, approximately 13 as a mean with an A-wave of 17 and a V-wave of 16.  A sat run was done and did not show a significant change in the various  areas.  The patient ultimately underwent a TE bubble study and showed large  amounts of bubbles on the left side of the heart but none across the  intraatrial septum.  She is felt not to have an intracardiac shunt.  The  patient comes in today for further evaluation where she continues to have  significant hypoxemia despite moderate flow oxygen.  She continues to have  dyspnea on exertion.   PHYSICAL EXAM:  Blood pressure is 110/64.  Pulse 93.  Temperature is 97.7.  Weight is 135 pounds.  O2 saturation on 3 L continuous is 74% with  ambulation and 83% at rest.  On 4 L, she is also 83% at rest.   IMPRESSION:  Severe hypoxemia and dyspnea secondary to intrapulmonary  shunting that I suspect is coming from her cirrhosis.  If this is the case,  there is really not a whole lot of treatment available to her other than to  maximize treatment of her liver disease and consider transplant if she was a  candidate.  There is no evidence of intracardiac shunting and the CT scan  did not show arteriovenous malformation.  I think it is very unlikely that  she has anomalous venous return as well since the sat run was really not  overly impressive.  I have had a long discussion with the patient and her  family about the  pathophysiology of this and have informed them of the  difficulty of treatment.   PLAN:  1. I would like to get input from Dr. Juanda Chance in terms of any further      treatment for her liver disease or if she believes that it is really      that severe.  2. We will arrange for the patient to get an Oxymizer or Pendant to help      with O2 conservation.  Increasing liter flow is unlikely to change      anything since she has a significant shunt.  3. We will give a flu shot.  4. We will consider having the patient seeing at [facility] to see if there are      any novel therapies or experimental meds that may help in this      situation.    ______________________________  Barbaraann Share, MD,FCCP    [facility]/MedQ  DD: 09/26/2006  DT: 09/27/2006  Job #: [id number]   cc:   Pricilla Riffle, MD, [facility]  Bruce H. Cato Mulligan, MD  Hedwig Morton. Juanda Chance, MD

## 2011-04-20 NOTE — Discharge Summary (Signed)
[name], Young               ACCOUNT NO.:  [id number]   MEDICAL RECORD NO.:  [id number]          PATIENT TYPE:  INP   LOCATION:  0349                         [facility]ACILITY:  [facility]   PHYSICIAN:  Rene Paci, M.D. LHCDATE OF BIRTH:  16-Jan-1932   DATE OF ADMISSION:  11/11/2004  DATE OF DISCHARGE:                                 DISCHARGE SUMMARY   DISCHARGE DIAGNOSES:  1.  Acute upper gastrointestinal bleed secondary to antral ulcer status post      esophagogastroduodenoscopy on November 11, 2004.  2.  Anemia secondary to above, status post 4 unit packed red blood cells      transfusion; hemoglobin and hemodynamically stable; discharge hemoglobin      9.9.  3.  Cirrhosis, likely alcoholic, with ascites, splenomegaly,      thrombocytopenia, and hypoalbuminemia.  No varices, no encephalopathy,      mild coagulopathy.  Currently asymptomatic status post gastrointestinal      evaluation.  Outpatient follow-up to initiate spironolactone at this      time.  Laboratory data for other etiology of cirrhosis negative to date.   DISCHARGE MEDICATIONS:  1.  Protonix 40 mg p.o. b.i.d. x2 weeks, then once p.o. daily before      breakfast.  2.  Aldactone 50 mg p.o. q.a.m.  3.  Multivitamin one p.o. daily.   HOSPITAL FOLLOW-UP:  With GI physician, Dr. Lina Sar, for Tuesday,  November 28, 2004 at 8:45 a.m. to reevaluate workup and management of  cirrhosis.  Also keep follow-up appointments with primary care physicians  Dr. Cato Mulligan as well as pulmonologist Dr. Sherene Sires as needed.  The patient is  given instructions to adhere to a low sodium diet as well as to avoid  alcohol, even seeking assistance with outpatient detoxification rehab if  needed.   CONDITION ON DISCHARGE:  Medically stable, tolerating a regular diet.  Understands plans for treatment and follow-up.  She was given a Pneumovax  and flu shot prior to discharge.   HOSPITAL COURSE BY PROBLEM:  #1 - ACUTE GASTROINTESTINAL BLEED.   The patient  is a 77 year old woman who presented to the emergency room the day of  admission complaining of dark soft maroon diarrhea followed by nausea and  vomiting of brownish material, then bright red blood.  She was hypotensive  and anemic with an INR of 1.5 despite no Coumadin treatment, and was felt to  have a symptomatic GI bleed.  She was admitted to intensive care, treated  aggressively with fluids, and packed red blood cell transfusion.  GI consult  was obtained and EGD was performed when the patient was stable on December  10 confirming the presence of an actively-bleeding antral ulcer which was  injected with epinephrine and BICAP.  Treated with Protonix IV q.12h. x48  hours and continued monitoring of H&H.  She remained stable after this  intervention and was transferred to the unit.  A total of 4 units  transfusion was given and the patient remained hemodynamically stable after  this.  Her ulcer was likely alcohol in nature.  Please see below for further  details.  There was also presence of portal gastropathy, likely underlying  cirrhosis.  The patient is instructed to continue her proton pump inhibitor  twice daily for the next weeks, then daily, and outpatient follow-up with GI  as needed.   #2 - CIRRHOSIS, PRESUMABLY ALCOHOLIC.  This is a new diagnosis for this  patient, who drinks a good bit of vodka on a daily basis.  She has never had  any signs/symptoms of abuse per her history and report, nor has she ever had  withdrawal.  No signs or symptoms of withdrawal were noted at this time.  However, with the findings of portal gastropathy, as well as  hypoalbuminemia, thrombocytopenia, and mild coagulopathy, further  investigation for cirrhosis was undergone.  CT abdomen and pelvis showed  changes consistent with cirrhosis with moderate ascites and positive  splenomegaly.  Laboratory workup was pursued by GI to exclude other causes,  which to this time have been negative.   Other tests still pending at time of  dictation.  After consideration of these results and patient history, as the  patient is asymptomatic from her cirrhosis at this time, it is felt  initiation of spironolactone is the only treatment needed at this time.  Further outpatient management per Dr. Lina Sar.     [place]   VL/MEDQ  D:  11/14/2004  T:  11/14/2004  Job:  [id number]

## 2011-04-20 NOTE — Assessment & Plan Note (Signed)
[facility]                         GASTROENTEROLOGY OFFICE NOTE   [name], [name]                      MRN:          [id number]  DATE:11/05/2006                            DOB:          1932/10/06    [name] is a 77 year old white female with Laennec's cirrhosis and  marked portal hypertension documented on recent Doppler studies of her  portal system showing splenomegaly, dilated portal vein and pelvic  varices. On upper endoscopy a year and a half ago, she did not have any  esophageal varices. She has mild coagulopathy and thrombocytopenia. She  was found to have severe pulmonary shunting and Dr. Shelle Iron asked [place]  whether her shunting could be related to the portal hypertension. It is  my assumption that her pulmonary shunting is a reflection of her  systemic portal hypertension. She came today with her husband and  daughter to discuss options for treating her portal hypertension. She  has not had any ascites in the last 2 years, although initially in  November 2005, at initial presentation, she did have marked ascites. She  also had an upper GI bleed due to antral ulcer, but has not had any  bleeding since then. She has been found to have cholelithiasis. Dr.  Shelle Iron asked [place] whether lowering her portal pressure would possibly  improve her portal shunting in the lungs, and I was not sure that it  would, but her family came today to discuss other options such as TIPS  procedure or portal cava shunt.   [name] is getting progressively more short of breath. She started  on home oxygen 3 liters a minute and she is now to 5 and most recently 6  liters a minute. It appears that her lung disease is progressing  rapidly. I am not sure that all of it is due to portal pressure. There  has been no ascites and no clinical encephalopathy, although her venous  ammonia in the past was elevated. The most recent venous ammonia was 25  and it was  normal. She was initially on lactulose and still takes some,  but encephalopathy has not been a problem.   I have talked to Mr. And Mrs. [name] today about possible referral to  a Tertiary  facility  to evaluate her  for TIPS procedure. The usual  indication would be ascites. In her case, it would be to lower her  portal pressure to decrease pulmonary shunting. It is not a typical  indication and therefore I would rather if she was evaluated for this  alswhe. Mr [name] and thei daughter  agreed to seek another opinion.  Personally, I do not think she is a good candidate for portal cava shunt  because of her tenuous medical condition.   PLAN:  1. Today, we will obtain her ammonia levels and liver function tests.  2. I will contact [facility] or [facility], Dr      Burtis Junes, Liver Specialty for appointment to assess for possible      TIPS procedure.     Hedwig Morton. Juanda Chance, MD  Electronically Signed   DMB/MedQ  DD: 11/05/2006  DT: 11/05/2006  Job #: [id number]   cc:   Barbaraann Share, MD,FCCP

## 2011-04-20 NOTE — Assessment & Plan Note (Signed)
[facility]                              CARDIOLOGY OFFICE NOTE   LADAWNA, WALGREN                      MRN:          [id number]  DATE:06/27/2006                            DOB:          07-18-1932    IDENTIFICATION:  [name] is a 77 year old woman who was referred by  Marcelyn Bruins for possible cardiac catheterization.   HISTORY OF PRESENT ILLNESS:  Patient has no known history of coronary artery  disease.  Note, she has a history of Laennec's cirrhosis with known  coagulopathy.  Had an episode of GI bleed in 2005, bright red blood per  rectum 2006.  Note, in the past her oxygen saturations have been around 77%  increasing to 94% on 2 L.  She was seen recently in pulmonary clinic.  She  presented to the emergency room in June, question GI bleed, again found to  be hypoxemic, dyspneic while making the bed at home, minimal activity.  Questionable history of interstitial lung disease, though no significant  DLCO abnormality after correction on her last PFTs.  A spiral CT was done  that was negative for pulmonary embolus, cardiomegaly, and vascular  congestion was noted, suspicion for pulmonary arterial hypertension.  Note,  calcified left coronary artery branches.  Diffuse hazy ground __________  appearance, nonspecific, though consistent with pulmonary edema.  Patient  presents again for evaluation for possible cardiac catheterization.   ALLERGIES:  None other than CODEINE making her sick.   PAST MEDICAL HISTORY:  1.  Laennec's cirrhosis.  2.  Coagulopathy.  3.  Hypoxemia.   CURRENT MEDICATIONS:  1.  Protonix 40 daily.  2.  Ferrous sulfate 325 daily.  3.  Lorazepam two nightly.  4.  Glucosamine 1.5 g daily.  5.  Fosamax 70 every week.  6.  Folic acid.  7.  O2 2 L.   SOCIAL HISTORY:  The patient is married.  Has a history of smoking one to  one and a half packs per day for the past 30-40 years, but quit six to seven  years ago.   Does not drink.   FAMILY HISTORY:  Significant for arthritis.   Mother died at age 82.  Father died at age 61.   REVIEW OF SYSTEMS:  All systems reviewed, negative for the above problem  except as noted.   PHYSICAL EXAMINATION:  GENERAL:  On examination the patient is in no  distress at rest.  O2 saturation, though, at rest was 76% decreasing down  into the low 70 range with walking.  Blood pressure 106/68, pulse 88, weight  131.  LUNGS:  Clear with patient moving air.  Faint basilar crackles.  CARDIAC:  Regular rate and rhythm.  2/6 systolic murmur heard best at the  apex.  ABDOMEN:  No definite RV heave.  No hepatomegaly.  EXTREMITIES:  Trace edema.   12-lead EKG:  Normal sinus rhythm, 88 beats per minute.  Normal axis.   IMPRESSION:  [name] is a 77 year old woman with profound hypoxemia.  I  agree with Marcelyn Bruins.  I would recommend cardiac catheterization right  and left with possible shunt series.  Will set up also for an echocardiogram  to evaluate with her murmur, see if anything is elucidated.   Review of her blood work, her platelets are above 90,000, 97,000 on last  check, INR of 1.5 so she is at some increased risk for oozing.  I think it  would be okay to proceed.  Will not schedule it beyond basic testing any  other laboratories.                                Pricilla Riffle, MD, [facility]    PVR/MedQ  DD:  06/28/2006  DT:  06/28/2006  Job #:  [id number]

## 2011-04-20 NOTE — H&P (Signed)
[name], [name]               ACCOUNT NO.:  [id number]   MEDICAL RECORD NO.:  [id number]          PATIENT TYPE:  INP   LOCATION:  1419                         FACILITY:  [facility]   PHYSICIAN:  Sean A. Everardo All, MD    DATE OF BIRTH:  16-Jan-1932   DATE OF ADMISSION:  03/28/2007  DATE OF DISCHARGE:                              HISTORY & PHYSICAL   REASON FOR ADMISSION:  Dehydration.   HISTORY OF PRESENT ILLNESS:  A 77 year old woman with several days of  polyuria, slight diarrhea and decreased p.o. intake.  She has slight  associated numbness of her feet.   PAST MEDICAL HISTORY:  1. Cirrhosis due to chronic alcoholism.  2. GERD.  3. Osteoporosis.  4. Chronic respiratory failure with chronic oxygen which has been      found due to right to left intrapulmonary shunting.   MEDICATIONS:  1. Protonix 40 mg a day.  2. Inderal 10 mg twice daily.  3. Ambien 10 mg nightly as needed for sleep.  4. Iron sulfate 325 mg daily.  5. Fosamax 70 mg weekly.  6. Folate 1 mg weekly.  7. Vitamin E tablet 1000 units daily.   SOCIAL HISTORY:  She is retired and she lives with her husband.  A  daughter and son-in-law are here.   FAMILY HISTORY:  Negative for the above.   REVIEW OF SYSTEMS:  Denies loss of consciousness, visual loss, fever,  chest pain, abdominal pain, rectal bleeding, hematuria, dysuria, skin  rash and weight loss.  There is no change in her chronic shortness of  breath.   PHYSICAL EXAMINATION:  VITAL SIGNS:  Blood pressure 122/55, heart rate  is 84, respiratory rate 18, temperature is 97.3.  GENERAL:  No distress.  SKIN:  Not jaundiced to my examination, not diaphoretic.  No rash.  HEENT:  No proptosis.  No periorbital swelling.  Mucous membranes are  dry.  NECK:  Supple.  CHEST:  Clear to auscultation.  I do not hear any adventitious breath  sounds.  No respiratory distress.  She has oxygen on. CARDIOVASCULAR:  No edema.  Regular rate and rhythm.  No murmur.  Pedal pulses  are  intact.  ABDOMEN:  Slight tenderness on the right side.  Not distended.  No  hepatosplenomegaly.  No hernia.  No mass.  BREASTS/GYNECOLOGIC/RECTAL:  Not done at this time due to her condition.  EXTREMITIES:  No deformity is seen.  NEUROLOGIC:  She is alert, oriented and cranial nerves appear to be  intact.  She moves all fours and sensation is intact to touch in the  feet at the time of my examination.   LABORATORY DATA AND X-RAY FINDINGS:  CBC normal except for platelets of  90,000, MCV 102.  CMET is normal except for low albumin.  Lipase is  normal.  INR equals 1.6.   IMPRESSION:  1. Dehydration of uncertain etiology.  A viral etiology is a possible      cause of her slight diarrhea.  2. Chronic cirrhosis and hepatic failure.  3. Chronic respiratory failure due to right to left intrapulmonary  shunting.  4. Elevated pro time due to hepatic failure.  5. Numbness of the feet of uncertain etiology.   PLAN:  1. Admit to any medical bed.  2. Oxygen to keep saturation acceptable.  3. Intravenous fluids.  4. Symptomatic therapy.  5. I discussed code status.  She states she wants full code, but would      not want to be started nor maintained on artificial measures if      there was not a reasonable chance at a functional recovery.  6. Check laboratory studies to evaluate her numbness.  7. She does not need any DVT prophylaxis because of elevated INR.      Sean A. Everardo All, MD  Electronically Signed     SAE/MEDQ  D:  03/28/2007  T:  03/28/2007  Job:  [id number]   cc:   Valetta Mole. Swords, MD  [address] [place]  [place] [address]

## 2011-05-07 ENCOUNTER — Other Ambulatory Visit: Payer: Self-pay | Admitting: Internal Medicine

## 2011-05-07 MED ORDER — PANTOPRAZOLE SODIUM 40 MG PO TBEC: 40.0000 mg | DELAYED_RELEASE_TABLET | Freq: Every day | ORAL | Status: DC

## 2011-05-07 NOTE — Telephone Encounter (Signed)
rx sent in electronically, pt aware 

## 2011-05-07 NOTE — Telephone Encounter (Signed)
Pt req refill of Pantoprazole 40 mg ER to [facility] order.

## 2011-05-17 ENCOUNTER — Other Ambulatory Visit: Payer: Self-pay | Admitting: Internal Medicine

## 2011-05-17 NOTE — Telephone Encounter (Signed)
Refill Zolpidem Tartrate Er 12.mg to [facility] order.

## 2011-05-17 NOTE — Telephone Encounter (Signed)
Pt needs OV 

## 2011-05-21 NOTE — Telephone Encounter (Signed)
lmoam to make an ov.

## 2011-05-22 ENCOUNTER — Other Ambulatory Visit: Payer: Self-pay | Admitting: Internal Medicine

## 2011-05-22 DIAGNOSIS — G4700 Insomnia, unspecified: Secondary | ICD-10-CM

## 2011-05-22 NOTE — Telephone Encounter (Signed)
Pts spouse called and said that needs refill of Ambien CR 12.5 mg, because pt is having dental surgery and is having all her teeth removed. Pls send to [facility] pharmacy for 1 month supply.

## 2011-05-23 ENCOUNTER — Telehealth: Payer: Self-pay | Admitting: *Deleted

## 2011-05-23 MED ORDER — ZOLPIDEM TARTRATE ER 12.5 MG PO TBCR: 12.5000 mg | EXTENDED_RELEASE_TABLET | Freq: Every evening | ORAL | Status: DC | PRN

## 2011-05-23 NOTE — Telephone Encounter (Signed)
done

## 2011-05-23 NOTE — Telephone Encounter (Signed)
Pts spouse called back again to check on status of Ambien CR 12.5 mg. Pt is needing this asap. Pls call today.

## 2011-05-23 NOTE — Telephone Encounter (Signed)
Last refill 02/22/11 #90

## 2011-05-23 NOTE — Telephone Encounter (Signed)
Pt's husband aware she needs an OV after this 3 month supply.  Faxed to [facility]

## 2011-05-23 NOTE — Telephone Encounter (Signed)
Pt's husband calling to check status of Ambien CR refill request to [facility] that was requested on Monday.

## 2011-05-24 NOTE — Telephone Encounter (Signed)
Have Dr. Cato Mulligan take care of this

## 2011-07-31 ENCOUNTER — Other Ambulatory Visit: Payer: Self-pay | Admitting: Internal Medicine

## 2011-07-31 DIAGNOSIS — G4700 Insomnia, unspecified: Secondary | ICD-10-CM

## 2011-07-31 NOTE — Telephone Encounter (Signed)
Pt called req script for zolpidem (AMBIEN CR) 12.5 MG CR tablet to [facility] order pharmacy.

## 2011-08-01 NOTE — Telephone Encounter (Signed)
Pt needs OV 

## 2011-08-03 ENCOUNTER — Encounter: Payer: Self-pay | Admitting: Internal Medicine

## 2011-08-03 MED ORDER — ZOLPIDEM TARTRATE ER 12.5 MG PO TBCR: 12.5000 mg | EXTENDED_RELEASE_TABLET | Freq: Every evening | ORAL | Status: DC | PRN

## 2011-08-03 NOTE — Telephone Encounter (Signed)
Pt called back to check on status of refill of zolpidem. I told pt that the nurse said that pt needs to sch ov. Pt has been set up for med check refill ov for 08/07/11 at 10:45am, as noted.

## 2011-08-03 NOTE — Telephone Encounter (Signed)
Addended by: Alfred Levins D on: 08/03/2011 11:54 AM   Modules accepted: Orders

## 2011-08-03 NOTE — Telephone Encounter (Signed)
rx faxed into [facility]

## 2011-08-07 ENCOUNTER — Encounter: Payer: Self-pay | Admitting: Internal Medicine

## 2011-08-07 ENCOUNTER — Ambulatory Visit (INDEPENDENT_AMBULATORY_CARE_PROVIDER_SITE_OTHER): Payer: Medicare Other | Admitting: Internal Medicine

## 2011-08-07 DIAGNOSIS — K7460 Unspecified cirrhosis of liver: Secondary | ICD-10-CM

## 2011-08-07 DIAGNOSIS — G4700 Insomnia, unspecified: Secondary | ICD-10-CM

## 2011-08-07 DIAGNOSIS — Z944 Liver transplant status: Secondary | ICD-10-CM

## 2011-08-07 MED ORDER — ZOLPIDEM TARTRATE ER 12.5 MG PO TBCR: 12.5000 mg | EXTENDED_RELEASE_TABLET | Freq: Every evening | ORAL | Status: DC | PRN

## 2011-08-07 NOTE — Assessment & Plan Note (Signed)
She has done remarkably well.

## 2011-08-07 NOTE — Progress Notes (Signed)
  Subjective:    Patient ID: [name], female    DOB: Mar 16, 1932, 77 y.o.   MRN: [id number]  HPI Patient comes in for followup. She has done remarkably well after liver transplant. Liver transplant for alcoholic cirrhosis. She is not presenting any alcohol. Her main complaints are related to insomnia. This is an ongoing way for years. Patient denies any polyuria, polydipsia. She denies any signs or symptoms of liver failure. She has regular followup with her transplant specialist.  Past Medical History  Diagnosis Date  . Osteoporosis   . Cirrhosis   . GI bleed    Past Surgical History  Procedure Date  . Liver transplant 08/08    reports that she has quit smoking. She does not have any smokeless tobacco history on file. She reports that she does not drink alcohol. Her drug history not on file. family history is not on file. Allergies  Allergen Reactions  . Codeine Phosphate     REACTION: unspecified      Review of Systems  patient denies chest pain, shortness of breath, orthopnea. Denies lower extremity edema, abdominal pain, change in appetite, change in bowel movements. Patient denies rashes, musculoskeletal complaints. No other specific complaints in a complete review of systems.      Objective:   Physical Exam  Well-developed well-nourished female in no acute distress. HEENT exam atraumatic, normocephalic, extraocular muscles are intact. Neck is supple. No jugular venous distention no thyromegaly. Chest clear to auscultation without increased work of breathing. Cardiac exam S1 and S2 are regular. Abdominal exam active bowel sounds, soft, nontender. Extremities no edema. Neurologic exam she is alert without any motor sensory deficits. Gait is normal.        Assessment & Plan:

## 2011-08-07 NOTE — Assessment & Plan Note (Signed)
She has done great F/u with [facility]

## 2011-08-12 NOTE — Assessment & Plan Note (Addendum)
Chronic problem. No change in treatment necessary.

## 2011-09-07 LAB — TACROLIMUS, BLOOD: Tacrolimus Lvl: 6.1

## 2011-09-07 LAB — COMPREHENSIVE METABOLIC PANEL
Chloride: 102
Sodium: 138
Total Protein: 5.4 — ABNORMAL LOW

## 2011-09-07 LAB — CBC
HCT: 27.7 — ABNORMAL LOW
Hemoglobin: 9.5 — ABNORMAL LOW
MCHC: 34.1
MCV: 94.4
Platelets: 179
RBC: 2.94 — ABNORMAL LOW
RDW: 17.2 — ABNORMAL HIGH
WBC: 4.2

## 2011-09-10 LAB — DIFFERENTIAL
Basophils Absolute: 0
Eosinophils Absolute: 0.1 — ABNORMAL LOW
Eosinophils Relative: 3
Lymphocytes Relative: 29
Lymphs Abs: 1.1
Neutrophils Relative %: 60

## 2011-09-10 LAB — COMPREHENSIVE METABOLIC PANEL
ALT: 104 — ABNORMAL HIGH
AST: 28
Albumin: 2.7 — ABNORMAL LOW
Alkaline Phosphatase: 122 — ABNORMAL HIGH
BUN: 11
CO2: 33 — ABNORMAL HIGH
Calcium: 9.2
Chloride: 102
Chloride: 99
Creatinine, Ser: 0.5
GFR calc Af Amer: >60
GFR calc non Af Amer: >60
Glucose, Bld: 110 — ABNORMAL HIGH
Potassium: 3.8
Sodium: 137
Total Bilirubin: 0.8
Total Bilirubin: 1.1
Total Protein: 5.4 — ABNORMAL LOW

## 2011-09-10 LAB — CBC
HCT: 26.5 — ABNORMAL LOW
Hemoglobin: 9.9 — ABNORMAL LOW
MCHC: 34.5
MCV: 92.9
Platelets: 192
RBC: 3.09 — ABNORMAL LOW
RDW: 17.9 — ABNORMAL HIGH
WBC: 3.7 — ABNORMAL LOW
WBC: 4.1

## 2011-09-10 LAB — URINE CULTURE
Colony Count: >=100000
Special Requests: NEGATIVE

## 2011-09-10 LAB — URINALYSIS, ROUTINE W REFLEX MICROSCOPIC
Glucose, UA: NEGATIVE
Ketones, ur: NEGATIVE
Nitrite: POSITIVE — AB
Protein, ur: NEGATIVE

## 2011-09-10 LAB — URINE MICROSCOPIC-ADD ON

## 2011-09-10 LAB — VITAMIN B12: Vitamin B-12: 432 (ref 211–911)

## 2011-09-10 LAB — HEMOGLOBIN AND HEMATOCRIT, BLOOD: HCT: 27.3 — ABNORMAL LOW

## 2011-11-07 ENCOUNTER — Ambulatory Visit (INDEPENDENT_AMBULATORY_CARE_PROVIDER_SITE_OTHER): Payer: Medicare Other | Admitting: Internal Medicine

## 2011-11-07 DIAGNOSIS — Z23 Encounter for immunization: Secondary | ICD-10-CM

## 2011-11-07 MED ORDER — PANTOPRAZOLE SODIUM 40 MG PO TBEC: 40.0000 mg | DELAYED_RELEASE_TABLET | Freq: Every day | ORAL | Status: DC

## 2011-11-12 ENCOUNTER — Telehealth: Payer: Self-pay | Admitting: Internal Medicine

## 2011-11-12 NOTE — Telephone Encounter (Signed)
Was seen last week. Checking on status of Protonix to be sent to [facility]. Please advise patient. Thanks.

## 2011-11-12 NOTE — Telephone Encounter (Signed)
This was sent in on Thursday.  Told pt to check with [facility].

## 2012-01-30 DIAGNOSIS — Z940 Kidney transplant status: Secondary | ICD-10-CM | POA: Diagnosis not present

## 2012-01-30 DIAGNOSIS — Z298 Encounter for other specified prophylactic measures: Secondary | ICD-10-CM | POA: Diagnosis not present

## 2012-01-30 DIAGNOSIS — Z944 Liver transplant status: Secondary | ICD-10-CM | POA: Diagnosis not present

## 2012-01-30 DIAGNOSIS — Z79899 Other long term (current) drug therapy: Secondary | ICD-10-CM | POA: Diagnosis not present

## 2012-02-01 ENCOUNTER — Telehealth: Payer: Self-pay | Admitting: Internal Medicine

## 2012-02-01 DIAGNOSIS — G4700 Insomnia, unspecified: Secondary | ICD-10-CM

## 2012-02-01 MED ORDER — ZOLPIDEM TARTRATE ER 12.5 MG PO TBCR: 12.5000 mg | EXTENDED_RELEASE_TABLET | Freq: Every evening | ORAL | Status: DC | PRN

## 2012-02-01 NOTE — Telephone Encounter (Signed)
Pt is needing refill of zolpidem (AMBIEN CR) 12.5 MG CR to [facility] ([facility] 90 day supply.

## 2012-02-01 NOTE — Telephone Encounter (Signed)
rx faxed to pharmacy

## 2012-02-04 ENCOUNTER — Telehealth: Payer: Self-pay | Admitting: Internal Medicine

## 2012-02-08 NOTE — Telephone Encounter (Signed)
Opened in error

## 2012-02-13 ENCOUNTER — Ambulatory Visit (INDEPENDENT_AMBULATORY_CARE_PROVIDER_SITE_OTHER): Payer: Medicare Other | Admitting: Family

## 2012-02-13 ENCOUNTER — Encounter: Payer: Self-pay | Admitting: Family

## 2012-02-13 VITALS — BP 120/80 | Temp 98.1°F | Ht 61.0 in | Wt 125.0 lb

## 2012-02-13 DIAGNOSIS — R05 Cough: Secondary | ICD-10-CM | POA: Diagnosis not present

## 2012-02-13 DIAGNOSIS — J157 Pneumonia due to Mycoplasma pneumoniae: Secondary | ICD-10-CM

## 2012-02-13 MED ORDER — DOXYCYCLINE HYCLATE 100 MG PO TABS: 100.0000 mg | ORAL_TABLET | Freq: Two times a day (BID) | ORAL | Status: AC

## 2012-02-13 MED ORDER — BENZONATATE 100 MG PO CAPS: 100.0000 mg | ORAL_CAPSULE | Freq: Two times a day (BID) | ORAL | Status: AC | PRN

## 2012-02-13 NOTE — Progress Notes (Signed)
Subjective:    Patient ID: [name], female    DOB: 01/11/1932, 78 y.o.   MRN: [id number]  HPI In 78 year old white female, nonsmoker, patient of Dr. Cato Mulligan in today with complaints of cough, congestion that is worsened over the last couple days. Overall, she's been battling these symptoms for about 7 or 8 days. Taken Alka-Seltzer cold and sinus and Delsym with no relief. She denies any fever, muscle aches or pains, chest pain, shortness of breath or edema.   Review of Systems  Constitutional: Negative.   HENT: Negative.   Respiratory: Positive for cough.   Cardiovascular: Negative.   Musculoskeletal: Negative.   Neurological: Negative.   Hematological: Negative.   Psychiatric/Behavioral: Negative.    Past Medical History  Diagnosis Date  . Osteoporosis   . Cirrhosis   . GI bleed     History   Social History  . Marital Status: Married    Spouse Name: N/A    Number of Children: N/A  . Years of Education: N/A   Occupational History  . Not on file.   Social History Main Topics  . Smoking status: Former Games developer  . Smokeless tobacco: Not on file  . Alcohol Use: No     h/o alcohol abuse  . Drug Use: Not on file  . Sexually Active: Not on file   Other Topics Concern  . Not on file   Social History Narrative  . No narrative on file    Past Surgical History  Procedure Date  . Liver transplant 08/08    No family history on file.  Allergies  Allergen Reactions  . Codeine Phosphate     REACTION: unspecified    Current Outpatient Prescriptions on File Prior to Visit  Medication Sig Dispense Refill  . cholecalciferol (VITAMIN D) 1000 UNITS tablet Take 1,000 Units by mouth daily.        . Glucosamine-Chondroit-Vit C-Mn (GLUCOSAMINE 1500 COMPLEX PO) Take by mouth daily.        Marland Kitchen levETIRAcetam (KEPPRA) 500 MG tablet Take 500 mg by mouth 2 (two) times daily.        . Multiple Vitamin (MULTIVITAMIN) tablet Take 1 tablet by mouth daily.        . pantoprazole  (PROTONIX) 40 MG tablet Take 1 tablet (40 mg total) by mouth daily.  90 tablet  1  . tacrolimus (PROGRAF) 1 MG capsule Take 5 mg by mouth daily.        Marland Kitchen zolpidem (AMBIEN CR) 12.5 MG CR tablet Take 1 tablet (12.5 mg total) by mouth at bedtime as needed for sleep.  90 tablet  1  . alendronate (FOSAMAX) 35 MG tablet Take 35 mg by mouth every 7 (seven) days. Take with a full glass of water on an empty stomach.         BP 120/80  Temp(Src) 98.1 F (36.7 C) (Oral)  Ht 5\' 1"  (1.549 m)  Wt 125 lb (56.7 kg)  BMI 23.62 kg/m2chart    Objective:   Physical Exam  Constitutional: She is oriented to person, place, and time. She appears well-developed and well-nourished.  HENT:  Right Ear: External ear normal.  Left Ear: External ear normal.  Nose: Nose normal.  Mouth/Throat: Oropharynx is clear and moist.  Neck: Normal range of motion.  Cardiovascular: Normal rate, regular rhythm and normal heart sounds.   Pulmonary/Chest: Effort normal. She has rales.       Wheezing and rales noted to the left lung base.  Abdominal:  Soft. Bowel sounds are normal.  Neurological: She is alert and oriented to person, place, and time.  Skin: Skin is warm and dry.  Psychiatric: She has a normal mood and affect.          Assessment & Plan:  Assessment: Mycoplasma pneumonia, cough  Plan: Doxycycline 100 mg twice a day x10 days. Rest. Drink plenty of fluids. Tessalon Perles 100 mg 1 capsule by mouth 3 times a day as needed when necessary cough. Patient to call the office if symptoms worsen or persist or recheck sooner when necessary.

## 2012-02-13 NOTE — Patient Instructions (Signed)

## 2012-02-15 ENCOUNTER — Ambulatory Visit: Payer: Medicare Other | Admitting: Family

## 2012-02-20 ENCOUNTER — Telehealth: Payer: Self-pay | Admitting: Internal Medicine

## 2012-02-20 NOTE — Telephone Encounter (Signed)
Pt was seen by Padonda.  Per Dr Cato Mulligan see if she would like to refill it

## 2012-02-20 NOTE — Telephone Encounter (Signed)
Pt still has cough and congestion. Since pt has weaken immune system, can pt have refill of doxycycline (VIBRA-TABS) 100 MG tablet called [facility] to [facility] [address]. Pls notify pts daughter, when this has been called in.

## 2012-02-20 NOTE — Telephone Encounter (Signed)
Left detailed message on [name] personally identified voicemail to notify her of Padonda's suggestion. Advised pt's daughter to call with questions or concerns

## 2012-02-20 NOTE — Telephone Encounter (Signed)
Mucinex OTC as directed. Doxycycline is a pretty effective antibiotic. Call in the next 24-48 if her problem persist despite taking Mucinex.

## 2012-04-16 DIAGNOSIS — R569 Unspecified convulsions: Secondary | ICD-10-CM | POA: Diagnosis not present

## 2012-04-21 DIAGNOSIS — Z298 Encounter for other specified prophylactic measures: Secondary | ICD-10-CM | POA: Diagnosis not present

## 2012-04-21 DIAGNOSIS — Z944 Liver transplant status: Secondary | ICD-10-CM | POA: Diagnosis not present

## 2012-05-05 ENCOUNTER — Other Ambulatory Visit: Payer: Self-pay | Admitting: *Deleted

## 2012-05-05 MED ORDER — PANTOPRAZOLE SODIUM 40 MG PO TBEC: 40.0000 mg | DELAYED_RELEASE_TABLET | Freq: Every day | ORAL | Status: DC

## 2012-07-15 DIAGNOSIS — H35039 Hypertensive retinopathy, unspecified eye: Secondary | ICD-10-CM | POA: Diagnosis not present

## 2012-07-15 DIAGNOSIS — H11159 Pinguecula, unspecified eye: Secondary | ICD-10-CM | POA: Diagnosis not present

## 2012-07-15 DIAGNOSIS — H02839 Dermatochalasis of unspecified eye, unspecified eyelid: Secondary | ICD-10-CM | POA: Diagnosis not present

## 2012-07-15 DIAGNOSIS — H353190 Nonexudative age-related macular degeneration, unspecified eye, stage unspecified: Secondary | ICD-10-CM | POA: Diagnosis not present

## 2012-07-15 DIAGNOSIS — H2510 Age-related nuclear cataract, unspecified eye: Secondary | ICD-10-CM | POA: Diagnosis not present

## 2012-07-24 ENCOUNTER — Other Ambulatory Visit: Payer: Self-pay | Admitting: *Deleted

## 2012-07-24 DIAGNOSIS — G4700 Insomnia, unspecified: Secondary | ICD-10-CM

## 2012-07-24 MED ORDER — ZOLPIDEM TARTRATE ER 12.5 MG PO TBCR: 12.5000 mg | EXTENDED_RELEASE_TABLET | Freq: Every evening | ORAL | Status: DC | PRN

## 2012-07-30 DIAGNOSIS — Z944 Liver transplant status: Secondary | ICD-10-CM | POA: Diagnosis not present

## 2012-07-30 DIAGNOSIS — Z79899 Other long term (current) drug therapy: Secondary | ICD-10-CM | POA: Diagnosis not present

## 2012-07-30 DIAGNOSIS — Z298 Encounter for other specified prophylactic measures: Secondary | ICD-10-CM | POA: Diagnosis not present

## 2012-07-30 DIAGNOSIS — Z940 Kidney transplant status: Secondary | ICD-10-CM | POA: Diagnosis not present

## 2012-08-26 DIAGNOSIS — Z79899 Other long term (current) drug therapy: Secondary | ICD-10-CM | POA: Diagnosis not present

## 2012-08-26 DIAGNOSIS — Z940 Kidney transplant status: Secondary | ICD-10-CM | POA: Diagnosis not present

## 2012-08-26 DIAGNOSIS — Z298 Encounter for other specified prophylactic measures: Secondary | ICD-10-CM | POA: Diagnosis not present

## 2012-08-26 DIAGNOSIS — Z944 Liver transplant status: Secondary | ICD-10-CM | POA: Diagnosis not present

## 2012-10-27 ENCOUNTER — Other Ambulatory Visit: Payer: Self-pay | Admitting: Internal Medicine

## 2012-10-27 DIAGNOSIS — G4700 Insomnia, unspecified: Secondary | ICD-10-CM

## 2012-10-27 MED ORDER — TACROLIMUS 1 MG PO CAPS: 5.0000 mg | ORAL_CAPSULE | Freq: Every day | ORAL | Status: DC

## 2012-10-27 MED ORDER — LEVETIRACETAM 500 MG PO TABS: 500.0000 mg | ORAL_TABLET | Freq: Two times a day (BID) | ORAL | Status: DC

## 2012-10-27 MED ORDER — PANTOPRAZOLE SODIUM 40 MG PO TBEC: 40.0000 mg | DELAYED_RELEASE_TABLET | Freq: Every day | ORAL | Status: DC

## 2012-10-27 MED ORDER — ZOLPIDEM TARTRATE ER 12.5 MG PO TBCR: 12.5000 mg | EXTENDED_RELEASE_TABLET | Freq: Every evening | ORAL | Status: DC | PRN

## 2012-10-27 NOTE — Telephone Encounter (Signed)
Pt needs refill on meds. Pt has CPX scheduled 01/12/13.  1.  Prograf 1mg ,  5 X day 2.  Keppra  500mg   2 X day 3.  Protonix 40 mg  1 X day 4.  Ambien 12.5mg   1 at night [facility] mail order.

## 2012-10-27 NOTE — Telephone Encounter (Signed)
rx sent in electronically 

## 2012-10-29 ENCOUNTER — Other Ambulatory Visit: Payer: Self-pay | Admitting: *Deleted

## 2012-10-29 MED ORDER — TACROLIMUS 1 MG PO CAPS: 5.0000 mg | ORAL_CAPSULE | Freq: Every day | ORAL | Status: DC

## 2012-11-05 ENCOUNTER — Ambulatory Visit (INDEPENDENT_AMBULATORY_CARE_PROVIDER_SITE_OTHER): Payer: Medicare Other | Admitting: Internal Medicine

## 2012-11-05 DIAGNOSIS — Z23 Encounter for immunization: Secondary | ICD-10-CM

## 2012-12-03 DIAGNOSIS — I351 Nonrheumatic aortic (valve) insufficiency: Secondary | ICD-10-CM | POA: Insufficient documentation

## 2012-12-04 DIAGNOSIS — Z940 Kidney transplant status: Secondary | ICD-10-CM | POA: Diagnosis not present

## 2012-12-04 DIAGNOSIS — Z298 Encounter for other specified prophylactic measures: Secondary | ICD-10-CM | POA: Diagnosis not present

## 2012-12-04 DIAGNOSIS — Z944 Liver transplant status: Secondary | ICD-10-CM | POA: Diagnosis not present

## 2012-12-04 DIAGNOSIS — Z79899 Other long term (current) drug therapy: Secondary | ICD-10-CM | POA: Diagnosis not present

## 2013-01-06 DIAGNOSIS — Z79899 Other long term (current) drug therapy: Secondary | ICD-10-CM | POA: Diagnosis not present

## 2013-01-06 DIAGNOSIS — Z940 Kidney transplant status: Secondary | ICD-10-CM | POA: Diagnosis not present

## 2013-01-06 DIAGNOSIS — Z298 Encounter for other specified prophylactic measures: Secondary | ICD-10-CM | POA: Diagnosis not present

## 2013-01-06 DIAGNOSIS — Z944 Liver transplant status: Secondary | ICD-10-CM | POA: Diagnosis not present

## 2013-01-12 ENCOUNTER — Encounter: Payer: Medicare Other | Admitting: Internal Medicine

## 2013-01-26 ENCOUNTER — Encounter: Payer: Self-pay | Admitting: Internal Medicine

## 2013-01-26 ENCOUNTER — Ambulatory Visit (INDEPENDENT_AMBULATORY_CARE_PROVIDER_SITE_OTHER): Payer: Medicare Other | Admitting: Internal Medicine

## 2013-01-26 VITALS — BP 126/60 | Temp 97.5°F | Wt 117.0 lb

## 2013-01-26 DIAGNOSIS — I38 Endocarditis, valve unspecified: Secondary | ICD-10-CM

## 2013-01-26 DIAGNOSIS — K7460 Unspecified cirrhosis of liver: Secondary | ICD-10-CM | POA: Diagnosis not present

## 2013-01-26 DIAGNOSIS — Z944 Liver transplant status: Secondary | ICD-10-CM | POA: Diagnosis not present

## 2013-01-26 DIAGNOSIS — R011 Cardiac murmur, unspecified: Secondary | ICD-10-CM | POA: Diagnosis not present

## 2013-01-26 NOTE — Progress Notes (Signed)
Patient ID: [name], female   DOB: 1932/06/10, 79 y.o.   MRN: [id number] Patient is here for followup of multiple medical problems. She is unremarkably well after liver transplant for cirrhosis.  Past Medical History  Diagnosis Date  . Osteoporosis   . Cirrhosis   . GI bleed     History   Social History  . Marital Status: Married    Spouse Name: N/A    Number of Children: N/A  . Years of Education: N/A   Occupational History  . Not on file.   Social History Main Topics  . Smoking status: Former Games developer  . Smokeless tobacco: Not on file  . Alcohol Use: No     Comment: h/o alcohol abuse  . Drug Use: Not on file  . Sexually Active: Not on file   Other Topics Concern  . Not on file   Social History Narrative  . No narrative on file    Past Surgical History  Procedure Laterality Date  . Liver transplant  08/08    No family history on file.  Allergies  Allergen Reactions  . Codeine Phosphate     REACTION: unspecified    Current Outpatient Prescriptions on File Prior to Visit  Medication Sig Dispense Refill  . alendronate (FOSAMAX) 35 MG tablet Take 35 mg by mouth every 7 (seven) days. Take with a full glass of water on an empty stomach.       . cholecalciferol (VITAMIN D) 1000 UNITS tablet Take 1,000 Units by mouth daily.        . Glucosamine-Chondroit-Vit C-Mn (GLUCOSAMINE 1500 COMPLEX PO) Take by mouth daily.        Marland Kitchen levETIRAcetam (KEPPRA) 500 MG tablet Take 1 tablet (500 mg total) by mouth 2 (two) times daily.  120 tablet  0  . Multiple Vitamin (MULTIVITAMIN) tablet Take 1 tablet by mouth daily.        . pantoprazole (PROTONIX) 40 MG tablet Take 1 tablet (40 mg total) by mouth daily.  90 tablet  0  . tacrolimus (PROGRAF) 1 MG capsule Take 5 capsules (5 mg total) by mouth daily.  450 capsule  1  . zolpidem (AMBIEN CR) 12.5 MG CR tablet Take 1 tablet (12.5 mg total) by mouth at bedtime as needed for sleep.  90 tablet  0   No current facility-administered  medications on file prior to visit.     patient denies chest pain, shortness of breath, orthopnea. Denies lower extremity edema, abdominal pain, change in appetite, change in bowel movements. Patient denies rashes, musculoskeletal complaints. No other specific complaints in a complete review of systems.   There were no vitals taken for this visit.  Well-developed well-nourished female in no acute distress. HEENT exam atraumatic, normocephalic, extraocular muscles are intact. Neck is supple. No jugular venous distention no thyromegaly. Chest clear to auscultation without increased work of breathing. Cardiac exam S1 and S2 are regular. Abdominal exam active bowel sounds, soft, nontender. Extremities no edema. Neurologic exam she is alert without any motor sensory deficits. Gait is normal.

## 2013-01-27 NOTE — Assessment & Plan Note (Addendum)
She has a new diastolic murmur Reviewed previous echo from [facility] Needs further evaluation

## 2013-01-27 NOTE — Assessment & Plan Note (Signed)
S/p transplant

## 2013-01-27 NOTE — Assessment & Plan Note (Signed)
She has regular f/u at [facility]- I can't see their labs but she has regular lab work at [facility]. I will not repeat here

## 2013-02-02 ENCOUNTER — Other Ambulatory Visit (HOSPITAL_COMMUNITY): Payer: Medicare Other

## 2013-02-09 ENCOUNTER — Telehealth: Payer: Self-pay | Admitting: Internal Medicine

## 2013-02-09 DIAGNOSIS — G4700 Insomnia, unspecified: Secondary | ICD-10-CM

## 2013-02-09 MED ORDER — PANTOPRAZOLE SODIUM 40 MG PO TBEC: 40.0000 mg | DELAYED_RELEASE_TABLET | Freq: Every day | ORAL | Status: DC

## 2013-02-09 MED ORDER — ZOLPIDEM TARTRATE ER 12.5 MG PO TBCR: 12.5000 mg | EXTENDED_RELEASE_TABLET | Freq: Every evening | ORAL | Status: DC | PRN

## 2013-02-09 NOTE — Telephone Encounter (Signed)
rx called in, number listed is not a working number so I was unable to notify daughter and no answer, no machine at pts home

## 2013-02-09 NOTE — Telephone Encounter (Signed)
Pt daughter Natalia Leatherwood called stating that her mother never received her refills she requested when she was her for her office visit. Pt daughter states that she will be out of meds in the next few day and is concerned because her mother receives her meds through [facility] and it could take sometime for her to receive them. Pt requesting a rush on the meds and if that is not possible she is requesting to have a small amount to be [facility] [address]  [facility] Pt requesting refills on: pantoprazole (PROTONIX) 40 MG tablet and zolpidem (AMBIEN CR) 12.5 MG CR tablet  Pt daughter requesting to be contacted at [phone]

## 2013-02-10 ENCOUNTER — Other Ambulatory Visit (HOSPITAL_COMMUNITY): Payer: Medicare Other

## 2013-02-16 ENCOUNTER — Other Ambulatory Visit (HOSPITAL_COMMUNITY): Payer: Medicare Other

## 2013-02-23 ENCOUNTER — Ambulatory Visit (HOSPITAL_COMMUNITY): Payer: Medicare Other | Attending: Cardiovascular Disease

## 2013-02-23 DIAGNOSIS — K7460 Unspecified cirrhosis of liver: Secondary | ICD-10-CM | POA: Insufficient documentation

## 2013-02-23 DIAGNOSIS — Z944 Liver transplant status: Secondary | ICD-10-CM | POA: Insufficient documentation

## 2013-02-23 DIAGNOSIS — R011 Cardiac murmur, unspecified: Secondary | ICD-10-CM | POA: Diagnosis not present

## 2013-02-23 DIAGNOSIS — Z87891 Personal history of nicotine dependence: Secondary | ICD-10-CM | POA: Insufficient documentation

## 2013-02-23 DIAGNOSIS — I38 Endocarditis, valve unspecified: Secondary | ICD-10-CM

## 2013-02-23 NOTE — Progress Notes (Signed)
Echocardiogram performed.  

## 2013-02-27 ENCOUNTER — Other Ambulatory Visit: Payer: Self-pay | Admitting: Internal Medicine

## 2013-02-27 DIAGNOSIS — R931 Abnormal findings on diagnostic imaging of heart and coronary circulation: Secondary | ICD-10-CM

## 2013-03-03 DIAGNOSIS — Z298 Encounter for other specified prophylactic measures: Secondary | ICD-10-CM | POA: Diagnosis not present

## 2013-03-03 DIAGNOSIS — Z940 Kidney transplant status: Secondary | ICD-10-CM | POA: Diagnosis not present

## 2013-03-03 DIAGNOSIS — Z79899 Other long term (current) drug therapy: Secondary | ICD-10-CM | POA: Diagnosis not present

## 2013-03-03 DIAGNOSIS — Z944 Liver transplant status: Secondary | ICD-10-CM | POA: Diagnosis not present

## 2013-03-08 ENCOUNTER — Other Ambulatory Visit: Payer: Self-pay | Admitting: Internal Medicine

## 2013-03-09 ENCOUNTER — Other Ambulatory Visit: Payer: Self-pay | Admitting: *Deleted

## 2013-03-09 MED ORDER — LEVETIRACETAM 500 MG PO TABS: 500.0000 mg | ORAL_TABLET | Freq: Two times a day (BID) | ORAL | Status: DC

## 2013-03-24 ENCOUNTER — Ambulatory Visit (INDEPENDENT_AMBULATORY_CARE_PROVIDER_SITE_OTHER): Payer: Medicare Other | Admitting: Cardiovascular Disease

## 2013-03-24 ENCOUNTER — Encounter: Payer: Self-pay | Admitting: Cardiovascular Disease

## 2013-03-24 VITALS — BP 118/70 | HR 67 | Wt 119.0 lb

## 2013-03-24 DIAGNOSIS — R011 Cardiac murmur, unspecified: Secondary | ICD-10-CM | POA: Diagnosis not present

## 2013-03-24 NOTE — Progress Notes (Signed)
Patient ID: [name], female   DOB: [birth year], 79 y.o.   MRN: [id number] 79 yo patient of Dr Tenny Craw Referred by Dr Cato Mulligan for murmur and abnormal echo.  She was seen in 2007 and had normal cath with normla EF and PA pressure 31/6 At that time had mild AR and mild MR.  She is from [place] and retired from [employer]  She is very active dancing walking and still drives. She has no chest pain, palpitatins or dyspnea.  Reviewed recent echo and she has  Normal LV size and function Moderate AR Mild Moderate MR No signs of pulmonary HTN  ROS: Denies fever, malais, weight loss, blurry vision, decreased visual acuity, cough, sputum, SOB, hemoptysis, pleuritic pain, palpitaitons, heartburn, abdominal pain, melena, lower extremity edema, claudication, or rash.  All other systems reviewed and negative   General: Affect appropriate Healthy:  appears stated age HEENT: normal Neck supple with no adenopathy JVP normal no bruits no thyromegaly Lungs clear with no wheezing and good diaphragmatic motion Heart:  S1/S2 no murmur,rub, gallop or click PMI normal Abdomen: benighn, BS positve, no tenderness, no AAA no bruit.  No HSM or HJR Distal pulses intact with no bruits No edema Neuro non-focal Skin warm and dry No muscular weakness  Medications Current Outpatient Prescriptions  Medication Sig Dispense Refill  . cholecalciferol (VITAMIN D) 1000 UNITS tablet Take 1,000 Units by mouth daily.        . Glucosamine-Chondroit-Vit C-Mn (GLUCOSAMINE 1500 COMPLEX PO) Take by mouth daily.        Marland Kitchen levETIRAcetam (KEPPRA) 500 MG tablet Take 1 tablet (500 mg total) by mouth 2 (two) times daily.  120 tablet  2  . Multiple Vitamin (MULTIVITAMIN) tablet Take 1 tablet by mouth daily.        . pantoprazole (PROTONIX) 40 MG tablet Take 1 tablet (40 mg total) by mouth daily.  90 tablet  3  . tacrolimus (PROGRAF) 1 MG capsule Take 5 capsules by mouth  daily  450 capsule  1  . zolpidem (AMBIEN CR) 12.5 MG CR tablet Take 1  tablet (12.5 mg total) by mouth at bedtime as needed for sleep.  90 tablet  0   No current facility-administered medications for this visit.    Allergies Codeine phosphate  Family History: No family history on file.  Social History: History   Social History  . Marital Status: Married    Spouse Name: N/A    Number of Children: N/A  . Years of Education: N/A   Occupational History  . Not on file.   Social History Main Topics  . Smoking status: Former Games developer  . Smokeless tobacco: Not on file  . Alcohol Use: No     Comment: h/o alcohol abuse  . Drug Use: Not on file  . Sexually Active: Not on file   Other Topics Concern  . Not on file   Social History Narrative  . No narrative on file    Electrocardiogram:  SR rate 67 normal  Assessment and Plan

## 2013-03-24 NOTE — Assessment & Plan Note (Signed)
Asymptomatic elderly female with moderate AR and mild to moderate MR Little progression since 2007  Normal LV cavity size and function BP lowish and would not add ACE.  F/U in a year No need for SBE.

## 2013-03-24 NOTE — Patient Instructions (Signed)
Your physician wants you to follow-up in: YEAR WITH DR NISHAN  You will receive a reminder letter in the mail two months in advance. If you don't receive a letter, please call our office to schedule the follow-up appointment.  Your physician recommends that you continue on your current medications as directed. Please refer to the Current Medication list given to you today. 

## 2013-05-04 DIAGNOSIS — Z298 Encounter for other specified prophylactic measures: Secondary | ICD-10-CM | POA: Diagnosis not present

## 2013-05-04 DIAGNOSIS — Z944 Liver transplant status: Secondary | ICD-10-CM | POA: Diagnosis not present

## 2013-05-05 ENCOUNTER — Other Ambulatory Visit: Payer: Self-pay | Admitting: *Deleted

## 2013-05-05 DIAGNOSIS — G4700 Insomnia, unspecified: Secondary | ICD-10-CM

## 2013-05-05 MED ORDER — ZOLPIDEM TARTRATE ER 12.5 MG PO TBCR: 12.5000 mg | EXTENDED_RELEASE_TABLET | Freq: Every evening | ORAL | Status: DC | PRN

## 2013-08-06 DIAGNOSIS — Z940 Kidney transplant status: Secondary | ICD-10-CM | POA: Diagnosis not present

## 2013-08-06 DIAGNOSIS — Z944 Liver transplant status: Secondary | ICD-10-CM | POA: Diagnosis not present

## 2013-08-06 DIAGNOSIS — Z79899 Other long term (current) drug therapy: Secondary | ICD-10-CM | POA: Diagnosis not present

## 2013-08-06 DIAGNOSIS — Z298 Encounter for other specified prophylactic measures: Secondary | ICD-10-CM | POA: Diagnosis not present

## 2013-08-29 ENCOUNTER — Other Ambulatory Visit: Payer: Self-pay | Admitting: Internal Medicine

## 2013-09-21 ENCOUNTER — Ambulatory Visit (INDEPENDENT_AMBULATORY_CARE_PROVIDER_SITE_OTHER): Payer: Medicare Other | Admitting: Nurse Practitioner

## 2013-09-21 ENCOUNTER — Encounter: Payer: Self-pay | Admitting: Nurse Practitioner

## 2013-09-21 VITALS — BP 143/70 | HR 78 | Ht 62.0 in | Wt 129.0 lb

## 2013-09-21 DIAGNOSIS — R569 Unspecified convulsions: Secondary | ICD-10-CM | POA: Diagnosis not present

## 2013-09-21 DIAGNOSIS — G40909 Epilepsy, unspecified, not intractable, without status epilepticus: Secondary | ICD-10-CM | POA: Insufficient documentation

## 2013-09-21 MED ORDER — LEVETIRACETAM 500 MG PO TABS: 500.0000 mg | ORAL_TABLET | Freq: Two times a day (BID) | ORAL | Status: DC

## 2013-09-21 NOTE — Patient Instructions (Signed)
Continue Keppra 500 twice daily Will renew for the next year Followup yearly and when necessary

## 2013-09-21 NOTE — Progress Notes (Signed)
[facility]  PATIENT: [name] DOB: 1932-05-29   REASON FOR VISIT: Followup for isolated seizure event  HISTORY OF PRESENT ILLNESS: [name], 79 year old white female returns for followup. She was last seen 04/16/2012.  She has a history of advanced alcoholic cirrhosis, portal hypertension splenomegaly, pelvic varices and GI bleed. She has also had shortness of breath attributed to COPD and significant right to left shunt at the pulmonary level related to pulmonary AV fistula. She was hospitalized and had code stroke, but the patient actually had a seizure. She had a remote right brain basal ganglia infarction and ill-defined lesion in her mesial left temporal lobe.MR angiography was unremarkable. She then had a liver transplant at [facility]  and has done extremely well. EEG 08/17/2008 was normal. She is currently on levetiracetam 500 twice daily. She has had no further seizure events. There was a discussion at one time about her coming off of her medication if her EEG was normal however she states today she continues to drive and does not wish to come off of her medications. No new  neurologic complaints.   REVIEW OF SYSTEMS: Full 14 system review of systems performed and notable only for:  Constitutional: N/A  Cardiovascular: N/A  Ear/Nose/Throat: N/A  Skin: N/A  Eyes: N/A  Respiratory: N/A  Gastroitestinal: N/A  Hematology/Lymphatic: N/A  Endocrine: N/A Musculoskeletal:N/A  Allergy/Immunology: N/A  Neurological: N/A Psychiatric: N/A   ALLERGIES: Allergies  Allergen Reactions  . Codeine Phosphate     REACTION: unspecified  . Nsaids Other (See Comments)  . Codeine Rash    HOME MEDICATIONS: Outpatient Prescriptions Prior to Visit  Medication Sig Dispense Refill  . cholecalciferol (VITAMIN D) 1000 UNITS tablet Take 1,000 Units by mouth daily.        . Glucosamine-Chondroit-Vit C-Mn (GLUCOSAMINE 1500 COMPLEX PO) Take by mouth daily.        Marland Kitchen  levETIRAcetam (KEPPRA) 500 MG tablet Take 1 tablet (500 mg total) by mouth 2 (two) times daily.  120 tablet  2  . Multiple Vitamin (MULTIVITAMIN) tablet Take 1 tablet by mouth daily.        . pantoprazole (PROTONIX) 40 MG tablet Take 1 tablet (40 mg total) by mouth daily.  90 tablet  3  . tacrolimus (PROGRAF) 1 MG capsule Take 5 capsules by mouth  daily  450 capsule  3  . zolpidem (AMBIEN CR) 12.5 MG CR tablet Take 1 tablet (12.5 mg total) by mouth at bedtime as needed for sleep.  90 tablet  1   No facility-administered medications prior to visit.    PAST MEDICAL HISTORY: Past Medical History  Diagnosis Date  . Osteoporosis   . Cirrhosis   . GI bleed   . Seizures     PAST SURGICAL HISTORY: Past Surgical History  Procedure Laterality Date  . Liver transplant  08/08    FAMILY HISTORY: History reviewed. No pertinent family history.  SOCIAL HISTORY: History   Social History  . Marital Status: Married    Spouse Name: [name]     Number of Children: 3  . Years of Education: 12th   Occupational History  . Retired    Social History Main Topics  . Smoking status: Former Games developer  . Smokeless tobacco: Never Used  . Alcohol Use: No     Comment: h/o alcohol abuse  . Drug Use: No  . Sexual Activity: Not on file   Other Topics Concern  . Not on file   Social History Narrative  Patient lives at home with husband [name].    Patient has 3 children.    Patient has a high school education level.    Patient is retired.      PHYSICAL EXAM  Filed Vitals:   09/21/13 1101  BP: 143/70  Pulse: 78  Height: 5\' 2"  (1.575 m)  Weight: 129 lb (58.514 kg)   Body mass index is 23.59 kg/(m^2).  Generalized: Well developed, in no acute distress   Neurological examination   Mentation: Alert oriented to time, place, history taking. Follows all commands speech and language fluent  Cranial nerve II-XII: .Pupils were equal round reactive to light extraocular movements were full,  visual field were full on confrontational test. Facial sensation and strength were normal. hearing was intact to finger rubbing bilaterally. Uvula tongue midline. head turning and shoulder shrug and were normal and symmetric.Tongue protrusion into cheek strength was normal. Motor: normal bulk and tone, full strength in the BUE, BLE, fine finger movements normal, no pronator drift. No focal weakness Coordination: finger-nose-finger, heel-to-shin bilaterally, no dysmetria Reflexes: Brachioradialis 2/2, biceps 2/2, triceps 2/2, patellar 2/2, Achilles 2/2, plantar responses were flexor bilaterally. Gait and Station: Rising up from seated position without assistance, normal stance, moderate stride, good arm swing, smooth turning, able to perform tiptoe, and heel walking without difficulty. Tandem gait steady   DATA (LABS, IMAGING, TESTING) None for review   ASSESSMENT AND PLAN  79 y.o. year old female  has a past medical history of  Seizures. here for followup. Remains on Keppra 500 twice daily without further seizure events  Continue Keppra 500 twice daily Will renew for the next year Followup yearly and when necessary Nilda Riggs, [facility], [facility], APRN  [facility] [address] [place], [place] [address] [phone]

## 2013-09-23 ENCOUNTER — Ambulatory Visit (INDEPENDENT_AMBULATORY_CARE_PROVIDER_SITE_OTHER): Payer: Medicare Other | Admitting: Internal Medicine

## 2013-09-23 DIAGNOSIS — Z23 Encounter for immunization: Secondary | ICD-10-CM

## 2013-10-20 ENCOUNTER — Other Ambulatory Visit: Payer: Self-pay | Admitting: *Deleted

## 2013-10-20 DIAGNOSIS — G4700 Insomnia, unspecified: Secondary | ICD-10-CM

## 2013-10-20 MED ORDER — ZOLPIDEM TARTRATE ER 12.5 MG PO TBCR: 12.5000 mg | EXTENDED_RELEASE_TABLET | Freq: Every evening | ORAL | Status: DC | PRN

## 2013-10-24 ENCOUNTER — Other Ambulatory Visit: Payer: Self-pay | Admitting: Internal Medicine

## 2013-11-04 DIAGNOSIS — Z79899 Other long term (current) drug therapy: Secondary | ICD-10-CM | POA: Diagnosis not present

## 2013-11-04 DIAGNOSIS — Z298 Encounter for other specified prophylactic measures: Secondary | ICD-10-CM | POA: Diagnosis not present

## 2013-11-04 DIAGNOSIS — Z944 Liver transplant status: Secondary | ICD-10-CM | POA: Diagnosis not present

## 2013-11-04 DIAGNOSIS — Z940 Kidney transplant status: Secondary | ICD-10-CM | POA: Diagnosis not present

## 2014-01-14 ENCOUNTER — Telehealth: Payer: Self-pay | Admitting: Internal Medicine

## 2014-01-14 NOTE — Telephone Encounter (Signed)
[facility] requesting refill of zolpidem (AMBIEN CR) 12.5 MG CR tablet

## 2014-01-15 NOTE — Telephone Encounter (Signed)
Pt needs office visit

## 2014-01-21 ENCOUNTER — Other Ambulatory Visit: Payer: Self-pay | Admitting: *Deleted

## 2014-01-21 DIAGNOSIS — G4700 Insomnia, unspecified: Secondary | ICD-10-CM

## 2014-01-21 MED ORDER — ZOLPIDEM TARTRATE ER 12.5 MG PO TBCR: 12.5000 mg | EXTENDED_RELEASE_TABLET | Freq: Every evening | ORAL | Status: DC | PRN

## 2014-02-03 DIAGNOSIS — Z940 Kidney transplant status: Secondary | ICD-10-CM | POA: Diagnosis not present

## 2014-02-03 DIAGNOSIS — Z79899 Other long term (current) drug therapy: Secondary | ICD-10-CM | POA: Diagnosis not present

## 2014-02-03 DIAGNOSIS — Z944 Liver transplant status: Secondary | ICD-10-CM | POA: Diagnosis not present

## 2014-02-03 DIAGNOSIS — Z298 Encounter for other specified prophylactic measures: Secondary | ICD-10-CM | POA: Diagnosis not present

## 2014-02-03 NOTE — Telephone Encounter (Signed)
Pt has appt scheduled.

## 2014-02-26 ENCOUNTER — Ambulatory Visit: Payer: Medicare Other | Admitting: Internal Medicine

## 2014-02-26 DIAGNOSIS — H524 Presbyopia: Secondary | ICD-10-CM | POA: Diagnosis not present

## 2014-02-26 DIAGNOSIS — H25019 Cortical age-related cataract, unspecified eye: Secondary | ICD-10-CM | POA: Diagnosis not present

## 2014-02-26 DIAGNOSIS — H353190 Nonexudative age-related macular degeneration, unspecified eye, stage unspecified: Secondary | ICD-10-CM | POA: Diagnosis not present

## 2014-02-26 DIAGNOSIS — H2510 Age-related nuclear cataract, unspecified eye: Secondary | ICD-10-CM | POA: Diagnosis not present

## 2014-03-03 ENCOUNTER — Encounter: Payer: Self-pay | Admitting: Internal Medicine

## 2014-03-03 ENCOUNTER — Ambulatory Visit (INDEPENDENT_AMBULATORY_CARE_PROVIDER_SITE_OTHER): Payer: Medicare Other | Admitting: Internal Medicine

## 2014-03-03 ENCOUNTER — Encounter: Payer: Self-pay | Admitting: *Deleted

## 2014-03-03 VITALS — BP 144/60 | HR 68 | Temp 97.3°F | Ht 62.0 in | Wt 120.0 lb

## 2014-03-03 DIAGNOSIS — Z944 Liver transplant status: Secondary | ICD-10-CM

## 2014-03-03 DIAGNOSIS — I351 Nonrheumatic aortic (valve) insufficiency: Secondary | ICD-10-CM

## 2014-03-03 DIAGNOSIS — R569 Unspecified convulsions: Secondary | ICD-10-CM | POA: Diagnosis not present

## 2014-03-03 DIAGNOSIS — Z79899 Other long term (current) drug therapy: Secondary | ICD-10-CM | POA: Diagnosis not present

## 2014-03-03 DIAGNOSIS — I359 Nonrheumatic aortic valve disorder, unspecified: Secondary | ICD-10-CM | POA: Diagnosis not present

## 2014-03-03 LAB — CBC WITH DIFFERENTIAL/PLATELET
BASOS ABS: 0 10*3/uL (ref 0.0–0.1)
Basophils Relative: 0.5 % (ref 0.0–3.0)
EOS PCT: 4.5 % (ref 0.0–5.0)
Eosinophils Absolute: 0.1 10*3/uL (ref 0.0–0.7)
HEMATOCRIT: 31.6 % — AB (ref 36.0–46.0)
Hemoglobin: 10.8 g/dL — ABNORMAL LOW (ref 12.0–15.0)
LYMPHS ABS: 1 10*3/uL (ref 0.7–4.0)
Lymphocytes Relative: 33 % (ref 12.0–46.0)
MCHC: 34.1 g/dL (ref 30.0–36.0)
MCV: 95.4 fl (ref 78.0–100.0)
Monocytes Absolute: 0.3 10*3/uL (ref 0.1–1.0)
Monocytes Relative: 9 % (ref 3.0–12.0)
Neutro Abs: 1.6 10*3/uL (ref 1.4–7.7)
Neutrophils Relative %: 53 % (ref 43.0–77.0)
Platelets: 184 10*3/uL (ref 150.0–400.0)
RBC: 3.31 Mil/uL — ABNORMAL LOW (ref 3.87–5.11)
RDW: 12.8 % (ref 11.5–14.6)
WBC: 3 10*3/uL — ABNORMAL LOW (ref 4.5–10.5)

## 2014-03-03 LAB — LIPID PANEL
Cholesterol: 164 mg/dL (ref 0–200)
HDL: 69.1 mg/dL (ref >39.00)
LDL Cholesterol: 76 mg/dL (ref 0–99)
TRIGLYCERIDES: 97 mg/dL (ref 0.0–149.0)
Total CHOL/HDL Ratio: 2
VLDL: 19.4 mg/dL (ref 0.0–40.0)

## 2014-03-03 NOTE — Progress Notes (Signed)
Liver transplant- she is doing great 7 years later (2008). Reviewed labs from [facility] march 2014  siezure disorder prior to transplant- has been on Keppra since that time  Sleep- long term ambien use  Aortic insufficiency- reviewed Echo. She has no sxs (denies SOB, PND, orthopnea, no lower extremity edema)  Past Medical History  Diagnosis Date  . Osteoporosis   . Cirrhosis   . GI bleed   . Seizures     History   Social History  . Marital Status: Married    Spouse Name: [name]     Number of Children: 3  . Years of Education: 12th   Occupational History  . Retired    Social History Main Topics  . Smoking status: Former Research scientist (life sciences)  . Smokeless tobacco: Never Used  . Alcohol Use: No     Comment: h/o alcohol abuse  . Drug Use: No  . Sexual Activity: Not on file   Other Topics Concern  . Not on file   Social History Narrative   Patient lives at home with husband [name].    Patient has 3 children.    Patient has a high school education level.    Patient is retired.     Past Surgical History  Procedure Laterality Date  . Liver transplant  08/08    No family history on file.  Allergies  Allergen Reactions  . Codeine Phosphate     REACTION: unspecified  . Nsaids Other (See Comments)  . Codeine Rash    Current Outpatient Prescriptions on File Prior to Visit  Medication Sig Dispense Refill  . cholecalciferol (VITAMIN D) 1000 UNITS tablet Take 1,000 Units by mouth daily.        . Glucosamine-Chondroit-Vit C-Mn (GLUCOSAMINE 1500 COMPLEX PO) Take by mouth daily.        Marland Kitchen levETIRAcetam (KEPPRA) 500 MG tablet Take 1 tablet (500 mg total) by mouth 2 (two) times daily.  180 tablet  3  . Multiple Vitamin (MULTIVITAMIN) tablet Take 1 tablet by mouth daily.        . pantoprazole (PROTONIX) 40 MG tablet Take 1 tablet (40 mg total) by mouth daily.  90 tablet  0  . tacrolimus (PROGRAF) 1 MG capsule Take 5 capsules by mouth  daily  450 capsule  3  . zolpidem (AMBIEN CR) 12.5 MG  CR tablet Take 1 tablet (12.5 mg total) by mouth at bedtime as needed for sleep.  90 tablet  0   No current facility-administered medications on file prior to visit.     patient denies chest pain, shortness of breath, orthopnea. Denies lower extremity edema, abdominal pain, change in appetite, change in bowel movements. Patient denies rashes, musculoskeletal complaints. No other specific complaints in a complete review of systems.   BP 144/60  Pulse 68  Temp(Src) 97.3 F (36.3 C) (Oral)  Ht 5\' 2"  (1.575 m)  Wt 120 lb (54.432 kg)  BMI 21.94 kg/m2 nad Chest cta cv- reg rate. 3-6/6 diastolic decrescendo murmur abd- soft, nontender extr- no edema   Aortic insufficiency Clinically not having symptoms. Reviewed echo. She has appt with Dr. Johnsie Cancel  LIVER REPLACED BY TRANSPLANT Followed by [facility]- reviewed labs and notes  Other convulsions No recurrence Continue meds

## 2014-03-03 NOTE — Progress Notes (Signed)
Pre visit review using our clinic review tool, if applicable. No additional management support is needed unless otherwise documented below in the visit note. 

## 2014-03-03 NOTE — Assessment & Plan Note (Signed)
Followed by [facility]- reviewed labs and notes

## 2014-03-03 NOTE — Assessment & Plan Note (Signed)
Clinically not having symptoms. Reviewed echo. She has appt with Dr. Johnsie Cancel

## 2014-03-03 NOTE — Assessment & Plan Note (Signed)
No recurrence Continue meds

## 2014-03-03 NOTE — Addendum Note (Signed)
Addended by: Elmer Picker on: 03/03/2014 09:07 AM   Modules accepted: Orders

## 2014-03-25 ENCOUNTER — Encounter: Payer: Self-pay | Admitting: Cardiovascular Disease

## 2014-03-25 ENCOUNTER — Ambulatory Visit (INDEPENDENT_AMBULATORY_CARE_PROVIDER_SITE_OTHER): Payer: Medicare Other | Admitting: Cardiovascular Disease

## 2014-03-25 VITALS — BP 130/68 | HR 70 | Ht 61.0 in | Wt 121.0 lb

## 2014-03-25 DIAGNOSIS — I359 Nonrheumatic aortic valve disorder, unspecified: Secondary | ICD-10-CM | POA: Diagnosis not present

## 2014-03-25 DIAGNOSIS — I351 Nonrheumatic aortic (valve) insufficiency: Secondary | ICD-10-CM

## 2014-03-25 DIAGNOSIS — I059 Rheumatic mitral valve disease, unspecified: Secondary | ICD-10-CM

## 2014-03-25 DIAGNOSIS — Z944 Liver transplant status: Secondary | ICD-10-CM

## 2014-03-25 DIAGNOSIS — I340 Nonrheumatic mitral (valve) insufficiency: Secondary | ICD-10-CM

## 2014-03-25 NOTE — Assessment & Plan Note (Signed)
LFT;s ok  On cellcept  F/u GI

## 2014-03-25 NOTE — Patient Instructions (Signed)
Your physician has requested that you have an echocardiogram. Echocardiography is a painless test that uses sound waves to create images of your heart. It provides your doctor with information about the size and shape of your heart and how well your heart's chambers and valves are working. This procedure takes approximately one hour. There are no restrictions for this procedure.  Your physician wants you to follow-up in: 6 months with Dr. Johnsie Cancel.  You will receive a reminder letter in the mail two months in advance. If you don't receive a letter, please call our office to schedule the follow-up appointment.

## 2014-03-25 NOTE — Progress Notes (Signed)
Patient ID: [name], female   DOB: 1932-07-06, 80 y.o.   MRN: [id number] 80 yo patient of Dr [name] Referred by Dr [name] for murmur and abnormal echo. She was seen in 2007 and had normal cath with normla EF and PA pressure 31/6 At that time had mild AR and mild MR. She is from [place] and retired from [employer] She is very active dancing walking and still drives. She has no chest pain, palpitatins or dyspnea. Reviewed recent echo 3/14 and she has    Normal LV size and function  Moderate AR Mild Moderate MR  No signs of pulmonary HTN  Doing well no dyspnea  [name] to neices' wedding in [place]     ROS: Denies fever, malais, weight loss, blurry vision, decreased visual acuity, cough, sputum, SOB, hemoptysis, pleuritic pain, palpitaitons, heartburn, abdominal pain, melena, lower extremity edema, claudication, or rash.  All other systems reviewed and negative  General: Affect appropriate Healthy:  appears stated age 26: normal Neck supple with no adenopathy JVP normal no bruits no thyromegaly Lungs clear with no wheezing and good diaphragmatic motion Heart:  S1/S2 AR  murmur, no rub, gallop or click PMI normal Abdomen: benighn, BS positve, no tenderness, no AAA no bruit.  No HSM or HJR Distal pulses intact with no bruits No edema Neuro non-focal Skin warm and dry No muscular weakness   Current Outpatient Prescriptions  Medication Sig Dispense Refill  . cholecalciferol (VITAMIN D) 1000 UNITS tablet Take 1,000 Units by mouth daily.        Marland Kitchen levETIRAcetam (KEPPRA) 500 MG tablet Take 1 tablet (500 mg total) by mouth 2 (two) times daily.  180 tablet  3  . Multiple Vitamin (MULTIVITAMIN) tablet Take 1 tablet by mouth daily.        . pantoprazole (PROTONIX) 40 MG tablet Take 1 tablet (40 mg total) by mouth daily.  90 tablet  0  . tacrolimus (PROGRAF) 1 MG capsule Take 5 capsules by mouth  daily  450 capsule  3  . zolpidem (AMBIEN CR) 12.5 MG CR tablet Take 1 tablet (12.5 mg total) by  mouth at bedtime as needed for sleep.  90 tablet  0   No current facility-administered medications for this visit.    Allergies  Codeine phosphate; Nsaids; and Codeine  Electrocardiogram:  SR rate 70 normal ECG   Assessment and Plan

## 2014-03-25 NOTE — Assessment & Plan Note (Signed)
Loud murmur moderate by echo a year ago  LV size and function compensated f/u echo

## 2014-04-07 ENCOUNTER — Ambulatory Visit (HOSPITAL_COMMUNITY): Payer: Medicare Other | Attending: Internal Medicine | Admitting: Cardiology

## 2014-04-07 DIAGNOSIS — I359 Nonrheumatic aortic valve disorder, unspecified: Secondary | ICD-10-CM | POA: Diagnosis not present

## 2014-04-07 DIAGNOSIS — I059 Rheumatic mitral valve disease, unspecified: Secondary | ICD-10-CM | POA: Diagnosis not present

## 2014-04-07 DIAGNOSIS — I079 Rheumatic tricuspid valve disease, unspecified: Secondary | ICD-10-CM | POA: Insufficient documentation

## 2014-04-07 DIAGNOSIS — Z87891 Personal history of nicotine dependence: Secondary | ICD-10-CM | POA: Diagnosis not present

## 2014-04-07 DIAGNOSIS — I340 Nonrheumatic mitral (valve) insufficiency: Secondary | ICD-10-CM

## 2014-04-07 DIAGNOSIS — I351 Nonrheumatic aortic (valve) insufficiency: Secondary | ICD-10-CM

## 2014-04-07 NOTE — Progress Notes (Signed)
Echo performed. 

## 2014-04-09 ENCOUNTER — Other Ambulatory Visit: Payer: Self-pay | Admitting: *Deleted

## 2014-04-09 DIAGNOSIS — G4700 Insomnia, unspecified: Secondary | ICD-10-CM

## 2014-04-09 MED ORDER — ZOLPIDEM TARTRATE ER 12.5 MG PO TBCR: 12.5000 mg | EXTENDED_RELEASE_TABLET | Freq: Every evening | ORAL | Status: DC | PRN

## 2014-04-13 ENCOUNTER — Telehealth: Payer: Self-pay | Admitting: Cardiovascular Disease

## 2014-04-13 NOTE — Telephone Encounter (Signed)
New message ° ° ° ° ° °Want echo results °

## 2014-04-13 NOTE — Telephone Encounter (Signed)
PT'S  DAUGHTER AWARE OF E CHO RESULTS./CY

## 2014-04-19 ENCOUNTER — Encounter: Payer: Self-pay | Admitting: Internal Medicine

## 2014-04-29 ENCOUNTER — Emergency Department (HOSPITAL_COMMUNITY): Payer: Medicare Other

## 2014-04-29 ENCOUNTER — Telehealth: Payer: Self-pay | Admitting: Cardiovascular Disease

## 2014-04-29 ENCOUNTER — Encounter (HOSPITAL_COMMUNITY): Payer: Self-pay | Admitting: Emergency Medicine

## 2014-04-29 ENCOUNTER — Emergency Department (HOSPITAL_COMMUNITY)
Admission: EM | Admit: 2014-04-29 | Discharge: 2014-04-29 | Disposition: A | Discharge status: Home / Self Care | Payer: Medicare Other | Attending: Emergency Medicine | Admitting: Emergency Medicine

## 2014-04-29 DIAGNOSIS — R55 Syncope and collapse: Secondary | ICD-10-CM | POA: Insufficient documentation

## 2014-04-29 DIAGNOSIS — Z8719 Personal history of other diseases of the digestive system: Secondary | ICD-10-CM | POA: Diagnosis not present

## 2014-04-29 DIAGNOSIS — Z23 Encounter for immunization: Secondary | ICD-10-CM | POA: Insufficient documentation

## 2014-04-29 DIAGNOSIS — Z8739 Personal history of other diseases of the musculoskeletal system and connective tissue: Secondary | ICD-10-CM | POA: Diagnosis not present

## 2014-04-29 DIAGNOSIS — Z79899 Other long term (current) drug therapy: Secondary | ICD-10-CM | POA: Diagnosis not present

## 2014-04-29 DIAGNOSIS — Z8679 Personal history of other diseases of the circulatory system: Secondary | ICD-10-CM | POA: Insufficient documentation

## 2014-04-29 DIAGNOSIS — Y939 Activity, unspecified: Secondary | ICD-10-CM | POA: Insufficient documentation

## 2014-04-29 DIAGNOSIS — S0081XA Abrasion of other part of head, initial encounter: Secondary | ICD-10-CM

## 2014-04-29 DIAGNOSIS — Y929 Unspecified place or not applicable: Secondary | ICD-10-CM | POA: Insufficient documentation

## 2014-04-29 DIAGNOSIS — IMO0002 Reserved for concepts with insufficient information to code with codable children: Secondary | ICD-10-CM | POA: Diagnosis not present

## 2014-04-29 DIAGNOSIS — J984 Other disorders of lung: Secondary | ICD-10-CM | POA: Diagnosis not present

## 2014-04-29 DIAGNOSIS — Z87891 Personal history of nicotine dependence: Secondary | ICD-10-CM | POA: Insufficient documentation

## 2014-04-29 DIAGNOSIS — R569 Unspecified convulsions: Secondary | ICD-10-CM | POA: Diagnosis not present

## 2014-04-29 DIAGNOSIS — Z7982 Long term (current) use of aspirin: Secondary | ICD-10-CM | POA: Insufficient documentation

## 2014-04-29 DIAGNOSIS — S0990XA Unspecified injury of head, initial encounter: Secondary | ICD-10-CM | POA: Diagnosis not present

## 2014-04-29 DIAGNOSIS — W19XXXA Unspecified fall, initial encounter: Secondary | ICD-10-CM

## 2014-04-29 DIAGNOSIS — R42 Dizziness and giddiness: Secondary | ICD-10-CM

## 2014-04-29 DIAGNOSIS — X58XXXA Exposure to other specified factors, initial encounter: Secondary | ICD-10-CM | POA: Insufficient documentation

## 2014-04-29 LAB — URINALYSIS, ROUTINE W REFLEX MICROSCOPIC
Bilirubin Urine: NEGATIVE
Glucose, UA: NEGATIVE mg/dL
Hgb urine dipstick: NEGATIVE
Ketones, ur: NEGATIVE mg/dL
Nitrite: NEGATIVE
PROTEIN: NEGATIVE mg/dL
Specific Gravity, Urine: 1.01 (ref 1.005–1.030)
UROBILINOGEN UA: 0.2 mg/dL (ref 0.0–1.0)
pH: 7 (ref 5.0–8.0)

## 2014-04-29 LAB — COMPREHENSIVE METABOLIC PANEL
ALT: 12 U/L (ref 0–35)
AST: 16 U/L (ref 0–37)
Albumin: 3.8 g/dL (ref 3.5–5.2)
Alkaline Phosphatase: 54 U/L (ref 39–117)
BUN: 11 mg/dL (ref 6–23)
CALCIUM: 9.2 mg/dL (ref 8.4–10.5)
CO2: 26 mEq/L (ref 19–32)
CREATININE: 0.87 mg/dL (ref 0.50–1.10)
Chloride: 94 mEq/L — ABNORMAL LOW (ref 96–112)
GFR calc non Af Amer: 62 mL/min — ABNORMAL LOW (ref >90)
GFR, EST AFRICAN AMERICAN: 72 mL/min — AB (ref >90)
Glucose, Bld: 109 mg/dL — ABNORMAL HIGH (ref 70–99)
Potassium: 4.9 mEq/L (ref 3.7–5.3)
Sodium: 129 mEq/L — ABNORMAL LOW (ref 137–147)
Total Bilirubin: 0.9 mg/dL (ref 0.3–1.2)
Total Protein: 6.6 g/dL (ref 6.0–8.3)

## 2014-04-29 LAB — CBC WITH DIFFERENTIAL/PLATELET
BASOS ABS: 0 10*3/uL (ref 0.0–0.1)
Basophils Relative: 0 % (ref 0–1)
EOS PCT: 1 % (ref 0–5)
Eosinophils Absolute: 0 10*3/uL (ref 0.0–0.7)
HEMATOCRIT: 30.8 % — AB (ref 36.0–46.0)
Hemoglobin: 10.9 g/dL — ABNORMAL LOW (ref 12.0–15.0)
LYMPHS ABS: 0.8 10*3/uL (ref 0.7–4.0)
LYMPHS PCT: 15 % (ref 12–46)
MCH: 32.9 pg (ref 26.0–34.0)
MCHC: 35.4 g/dL (ref 30.0–36.0)
MCV: 93.1 fL (ref 78.0–100.0)
MONO ABS: 0.3 10*3/uL (ref 0.1–1.0)
Monocytes Relative: 6 % (ref 3–12)
Neutro Abs: 4.4 10*3/uL (ref 1.7–7.7)
Neutrophils Relative %: 78 % — ABNORMAL HIGH (ref 43–77)
Platelets: 153 10*3/uL (ref 150–400)
RBC: 3.31 MIL/uL — ABNORMAL LOW (ref 3.87–5.11)
RDW: 11.9 % (ref 11.5–15.5)
WBC: 5.5 10*3/uL (ref 4.0–10.5)

## 2014-04-29 LAB — URINE MICROSCOPIC-ADD ON

## 2014-04-29 LAB — TROPONIN I

## 2014-04-29 MED ORDER — TETANUS-DIPHTH-ACELL PERTUSSIS 5-2.5-18.5 LF-MCG/0.5 IM SUSP
0.5000 mL | Freq: Once | INTRAMUSCULAR | Status: AC
  Administered 2014-04-29: 0.5 mL via INTRAMUSCULAR
  Filled 2014-04-29: qty 0.5

## 2014-04-29 MED ORDER — SODIUM CHLORIDE 0.9 % IV BOLUS (SEPSIS)
250.0000 mL | Freq: Once | INTRAVENOUS | Status: AC
  Administered 2014-04-29: 250 mL via INTRAVENOUS

## 2014-04-29 NOTE — Telephone Encounter (Signed)
Spoke with daughter and she stated her mother's head started hurting/pounding and then she got really dizzy. Patient went to restroom and when she got up she started to fall secondary to dizziness. Her daughter came down to see what the noise was and she saw her mother resting on the floor and she helped her to the bed. Heart rate per daughter was up to 102. Patient heart now is normal and dizziness subsided.  Patient had not eaten but not unusual for her. Did inquire about drinking plenty of fluids and heard mother telling daughter she should drink more. Advised daughter to call PCP since Dr Johnsie Cancel not in the office. Daughter stated if she could not get in with PCP or here she would take her to ED. Will forward to Dr Johnsie Cancel and Altha Harm LPN so will be aware

## 2014-04-29 NOTE — Discharge Instructions (Signed)

## 2014-04-29 NOTE — ED Provider Notes (Signed)
CSN: [id number]     Arrival date & time 04/29/14  1314 History   First MD Initiated Contact with Patient 04/29/14 1503     Chief Complaint  Patient presents with  . Dizziness  . Fall     (Consider location/radiation/quality/duration/timing/severity/associated sxs/prior Treatment) Patient is a 80 y.o. female presenting with dizziness and fall. The history is provided by the patient.  Dizziness Quality:  Lightheadedness Severity:  Moderate Onset quality:  Sudden Duration: 5-10 min. Timing: once. Progression:  Resolved Chronicity:  New Context comment:  At rest Relieved by:  Nothing Worsened by:  Nothing tried Ineffective treatments:  None tried Associated symptoms: palpitations   Associated symptoms: no chest pain, no diarrhea, no headaches, no nausea, no shortness of breath and no vomiting   Fall This is a new problem. The current episode started 3 to 5 hours ago. Episode frequency: once. The problem has been resolved. Pertinent negatives include no chest pain, no abdominal pain, no headaches and no shortness of breath. Nothing aggravates the symptoms. Nothing relieves the symptoms. She has tried nothing for the symptoms. The treatment provided significant relief.    Past Medical History  Diagnosis Date  . Osteoporosis   . Cirrhosis   . GI bleed   . Seizures   . Heart valve problem     "2 leaking heart valves"   Past Surgical History  Procedure Laterality Date  . Liver transplant  08/08   History reviewed. No pertinent family history. History  Substance Use Topics  . Smoking status: Former Research scientist (life sciences)  . Smokeless tobacco: Never Used  . Alcohol Use: No     Comment: h/o alcohol abuse   OB History   Grav Para Term Preterm Abortions TAB SAB Ect Mult Living                 Review of Systems  Constitutional: Negative for fever and fatigue.  HENT: Negative for congestion and drooling.   Eyes: Negative for pain.  Respiratory: Negative for cough and shortness of breath.    Cardiovascular: Positive for palpitations. Negative for chest pain.  Gastrointestinal: Negative for nausea, vomiting, abdominal pain and diarrhea.  Genitourinary: Negative for dysuria and hematuria.  Musculoskeletal: Negative for back pain, gait problem and neck pain.  Skin: Negative for color change.  Neurological: Positive for dizziness. Negative for headaches.  Hematological: Negative for adenopathy.  Psychiatric/Behavioral: Negative for behavioral problems.  All other systems reviewed and are negative.     Allergies  Nsaids and Codeine  Home Medications   Prior to Admission medications   Medication Sig Start Date End Date Taking? Authorizing Provider  aspirin EC 81 MG tablet Take 81 mg by mouth daily.   Yes Historical Provider, MD  cholecalciferol (VITAMIN D) 1000 UNITS tablet Take 1,000 Units by mouth daily.     Yes Historical Provider, MD  levETIRAcetam (KEPPRA) 500 MG tablet Take 1 tablet (500 mg total) by mouth 2 (two) times daily. 09/21/13  Yes Dennie Bible, NP  Multiple Vitamin (MULTIVITAMIN) tablet Take 1 tablet by mouth daily.     Yes Historical Provider, MD  pantoprazole (PROTONIX) 40 MG tablet Take 1 tablet (40 mg total) by mouth daily. 10/24/13  Yes Lisabeth Pick, MD  tacrolimus (PROGRAF) 1 MG capsule Take 2-3 mg by mouth 2 (two) times daily. Take 3 capsules in the morning and 2 capsule a night   Yes Historical Provider, MD  zolpidem (AMBIEN CR) 12.5 MG CR tablet Take 1 tablet (12.5 mg total)  by mouth at bedtime as needed for sleep. 04/09/14 04/09/15 Yes Bruce Lemmie Evens Swords, MD   BP 142/48  Pulse 60  Resp 14  SpO2 100% Physical Exam  Nursing note and vitals reviewed. Constitutional: She is oriented to person, place, and time. She appears well-developed and well-nourished.  HENT:  Head: Normocephalic.  Mouth/Throat: Oropharynx is clear and moist. No oropharyngeal exudate.  Eyes: Conjunctivae and EOM are normal. Pupils are equal, round, and reactive to light.   Neck: Normal range of motion. Neck supple.  Cardiovascular: Normal rate, regular rhythm, normal heart sounds and intact distal pulses.  Exam reveals no gallop and no friction rub.   No murmur heard. Pulmonary/Chest: Effort normal and breath sounds normal. No respiratory distress. She has no wheezes.  Abdominal: Soft. Bowel sounds are normal. There is no tenderness. There is no rebound and no guarding.  Musculoskeletal: Normal range of motion. She exhibits no edema and no tenderness.  Neurological: She is alert and oriented to person, place, and time. She has normal strength. No cranial nerve deficit or sensory deficit. She displays a negative Romberg sign. Coordination and gait normal.  alert, oriented x3 speech: normal in context and clarity memory: intact grossly cranial nerves II-XII: intact motor strength: full proximally and distally no involuntary movements or tremors sensation: intact to light touch diffusely  cerebellar: finger-to-nose and heel-to-shin intact gait: normal forwards and backwards, normal tandem gait   Skin: Skin is warm and dry.  Psychiatric: She has a normal mood and affect. Her behavior is normal.    ED Course  Procedures (including critical care time) Labs Review Labs Reviewed  CBC WITH DIFFERENTIAL - Abnormal; Notable for the following:    RBC 3.31 (*)    Hemoglobin 10.9 (*)    HCT 30.8 (*)    Neutrophils Relative % 78 (*)    All other components within normal limits  COMPREHENSIVE METABOLIC PANEL - Abnormal; Notable for the following:    Sodium 129 (*)    Chloride 94 (*)    Glucose, Bld 109 (*)    GFR calc non Af Amer 62 (*)    GFR calc Af Amer 72 (*)    All other components within normal limits  URINALYSIS, ROUTINE W REFLEX MICROSCOPIC - Abnormal; Notable for the following:    Leukocytes, UA MODERATE (*)    All other components within normal limits  URINE MICROSCOPIC-ADD ON - Abnormal; Notable for the following:    Squamous Epithelial / LPF FEW  (*)    Bacteria, UA FEW (*)    All other components within normal limits  TROPONIN I    Imaging Review Dg Chest 2 View  04/29/2014   CLINICAL DATA:  Syncope.  Head injury.  EXAM: CHEST  2 VIEW  COMPARISON:  11/18/2008  FINDINGS: Chronic interstitial accentuation. Borderline cardiomegaly. No overt edema. No pleural effusion or significant airspace opacity. Mild bibasilar scarring.  IMPRESSION: 1. Chronic interstitial accentuation, nonspecific. 2. Chronic borderline cardiomegaly. 3. Mild bibasilar scarring.   Electronically Signed   By: Sherryl Barters M.D.   On: 04/29/2014 16:42   Ct Head Wo Contrast  04/29/2014   CLINICAL DATA:  Status post fall.  Dizziness.  EXAM: CT HEAD WITHOUT CONTRAST  TECHNIQUE: Contiguous axial images were obtained from the base of the skull through the vertex without intravenous contrast.  COMPARISON:  Head CT scan 03/29/2007.  FINDINGS: There is cortical atrophy. No evidence of acute abnormality including infarct, hemorrhage, mass lesion, mass effect, midline shift or abnormal  extra-axial fluid collection. No hydrocephalus or pneumocephalus. The calvarium is intact. Imaged paranasal sinuses and mastoid air cells are clear.  IMPRESSION: No acute abnormality.  Cortical atrophy.   Electronically Signed   By: Inge Rise M.D.   On: 04/29/2014 16:15     EKG Interpretation   Date/Time:  Thursday Apr 29 2014 13:34:12 EDT Ventricular Rate:  71 PR Interval:  161 QRS Duration: 77 QT Interval:  408 QTC Calculation: 443 R Axis:   59 Text Interpretation:  Sinus rhythm Probable left atrial enlargement No  significant change since last tracing Confirmed by Katelyne Galster  MD, Nastacia Raybuck  ([id number]) on 04/29/2014 4:45:37 PM      MDM   Final diagnoses:  Abrasion of face  Fall  Dizziness    3:24 PM 80 y.o. female w hx of liver txplt for hepatopulm syn approx 7 yrs ago on prograf, hx of heart valvular abn who presents with dizziness and fall which occurred at approximately 11 AM  this morning. The patient states that she was sitting on the bed when she began having a prodrome of head throbbing, palpitations, and lightheadedness. She states that she got up to go to the bathroom and her symptoms worsen. She fell to the ground but denies loss of consciousness. She did suffer abrasions to her forehead and the bridge of her nose. She states that her symptoms lasted approximately 5-10 minutes and then resolved. She denies any chest pain or shortness of breath. She is asymptomatic on exam currently. She is afebrile and vital signs are unremarkable here. She has a normal neurologic exam. Will get screening labs and imaging. Will update tetanus.  Recently had echo on 04/07/14.  5:35 PM: Pt continues to appear well. Mildly low Na. Imaging non-contrib.   I have discussed the diagnosis/risks/treatment options with the patient and family and believe the pt to be eligible for discharge home to follow-up with pcp early next week to discuss holter monitoring as needed. We also discussed returning to the ED immediately if new or worsening sx occur. We discussed the sx which are most concerning (e.g., further dizziness, cp, sob) that necessitate immediate return. Medications administered to the patient during their visit and any new prescriptions provided to the patient are listed below.  Medications given during this visit Medications  sodium chloride 0.9 % bolus 250 mL (0 mLs Intravenous Stopped 04/29/14 1648)  Tdap (BOOSTRIX) injection 0.5 mL (0.5 mLs Intramuscular Given 04/29/14 1550)    New Prescriptions   No medications on file     Blanchard Kelch, MD 04/29/14 1737

## 2014-04-29 NOTE — ED Notes (Signed)
Pt from home with c/o dizziness, heart palpitations and nausea, leading to fall and head injury.  Pt reports only weakness at this time.  Hx of liver transplant 7 years ago, leaking heart valves.  Pt in NAD, A&O.

## 2014-04-29 NOTE — Telephone Encounter (Signed)
New message    A few minutes ago daughter was downstairs  Heard a sound  patient had falling  Patient check her  heart rate by machine it was high. Patient in bed head starting pounding . Dizzy .   Heart rate normal.

## 2014-05-03 DIAGNOSIS — Z298 Encounter for other specified prophylactic measures: Secondary | ICD-10-CM | POA: Diagnosis not present

## 2014-05-03 DIAGNOSIS — Z944 Liver transplant status: Secondary | ICD-10-CM | POA: Diagnosis not present

## 2014-05-06 DIAGNOSIS — Z79899 Other long term (current) drug therapy: Secondary | ICD-10-CM | POA: Diagnosis not present

## 2014-05-06 DIAGNOSIS — Z940 Kidney transplant status: Secondary | ICD-10-CM | POA: Diagnosis not present

## 2014-05-06 DIAGNOSIS — Z298 Encounter for other specified prophylactic measures: Secondary | ICD-10-CM | POA: Diagnosis not present

## 2014-05-06 DIAGNOSIS — Z944 Liver transplant status: Secondary | ICD-10-CM | POA: Diagnosis not present

## 2014-05-17 ENCOUNTER — Ambulatory Visit (INDEPENDENT_AMBULATORY_CARE_PROVIDER_SITE_OTHER): Payer: Medicare Other | Admitting: Physician Assistant

## 2014-05-17 ENCOUNTER — Encounter: Payer: Self-pay | Admitting: Physician Assistant

## 2014-05-17 VITALS — BP 118/69 | HR 60 | Ht 61.0 in | Wt 121.0 lb

## 2014-05-17 DIAGNOSIS — R42 Dizziness and giddiness: Secondary | ICD-10-CM | POA: Diagnosis not present

## 2014-05-17 DIAGNOSIS — I38 Endocarditis, valve unspecified: Secondary | ICD-10-CM | POA: Diagnosis not present

## 2014-05-17 DIAGNOSIS — I359 Nonrheumatic aortic valve disorder, unspecified: Secondary | ICD-10-CM

## 2014-05-17 DIAGNOSIS — E871 Hypo-osmolality and hyponatremia: Secondary | ICD-10-CM | POA: Diagnosis not present

## 2014-05-17 DIAGNOSIS — I351 Nonrheumatic aortic (valve) insufficiency: Secondary | ICD-10-CM

## 2014-05-17 LAB — BASIC METABOLIC PANEL
BUN/Creatinine Ratio: 8 — ABNORMAL LOW (ref 11–26)
BUN: 7 mg/dL — ABNORMAL LOW (ref 8–27)
CO2: 27 mmol/L (ref 18–29)
Calcium: 9.4 mg/dL (ref 8.7–10.3)
Chloride: 96 mmol/L (ref 96–108)
Creatinine, Ser: 0.9 mg/dL (ref 0.57–1.00)
GFR calc non Af Amer: 61 mL/min/{1.73_m2} (ref >59)
GFR, EST AFRICAN AMERICAN: 70 mL/min/{1.73_m2} (ref >59)
Glucose: 99 mg/dL (ref 65–99)
POTASSIUM: 4.9 mmol/L (ref 3.5–5.2)
SODIUM: 132 mmol/L — AB (ref 134–144)

## 2014-05-17 NOTE — Patient Instructions (Signed)
Your physician recommends that you return for lab work in: [place] ([facility])  KEEP APPOINTMENT WITH DR. Johnsie Cancel

## 2014-05-17 NOTE — Assessment & Plan Note (Signed)
Recent 2-D echo on 04/07/14 showed moderate AI and moderate MR. Stable

## 2014-05-17 NOTE — Assessment & Plan Note (Addendum)
Patient had an episode of dizziness with fall and question of palpitations. Patient has been asymptomatic since. Her sodium was 129 in the emergency room. She is also chronically anemic with a hemoglobin of 10.9. This appears stable since 2011. We will check a repeat sodium today. I'm not inclined to order a monitor as she is asymptomatic. She has recurrent symptoms we can place a monitor on her. We did orthostatics on the patient today and she is not orthostatic.

## 2014-05-17 NOTE — Assessment & Plan Note (Signed)
Patient's sodium was 129. We will repeat today.

## 2014-05-17 NOTE — Progress Notes (Signed)
HPI:  This is a 80 year old female patient of Dr.Nishan and Dr. [name] who recently saw Dr. Johnsie Cancel because of a heart murmur. She was found to have moderate AI and moderate MR with normal LV function EF 55-60% and mild LVH. She had grade 2 diastolic dysfunction on 2Decho 04/07/14.She has a history of a normal cath in 2007.  On 04/29/14 she had an episode of dizziness followed by a fall prompting an emergency room visit. CT of the head showed no acute abnormality and cortical atrophy. Prior to the dizziness she had a prodrome of head throbbing, palpitations and lightheadedness. She got up to the bathroom and her symptoms worsen at which time she fell. She never lost consciousness. Sodium was low at 129 and she was given sodium chloride bolus. She was also anemic with a hemoglobin of 10.9, which appears chronic from labs, troponin negative.Today the patient can't recall having palpitations. She's never had an episode like this before and hasn't had any since. She does not drink a lot of fluids and her daughter is constantly pushing water on her. Prior to this episode she was bent over in a sink washing her hair. She walked upstairs and was still bent over drying her hair. She got up to use the bathroom at which time she became dizzy and fell.   Patient has history of liver transplant in 2008. She has seizure disorder prior to transplant treated with Keppra.   Allergies: -- Nsaids -- Other (See Comments)  -- Codeine -- Rash  Current Outpatient Prescriptions on File Prior to Visit: aspirin EC 81 MG tablet, Take 81 mg by mouth daily., Disp: , Rfl:  cholecalciferol (VITAMIN D) 1000 UNITS tablet, Take 1,000 Units by mouth daily.  , Disp: , Rfl:  levETIRAcetam (KEPPRA) 500 MG tablet, Take 1 tablet (500 mg total) by mouth 2 (two) times daily., Disp: 180 tablet, Rfl: 3 Multiple Vitamin (MULTIVITAMIN) tablet, Take 1 tablet by mouth daily.  , Disp: , Rfl:  pantoprazole (PROTONIX) 40 MG tablet, Take 1 tablet (40 mg  total) by mouth daily., Disp: 90 tablet, Rfl: 0 tacrolimus (PROGRAF) 1 MG capsule, Take 2-3 mg by mouth 2 (two) times daily. Take 3 capsules in the morning and 2 capsule a night, Disp: , Rfl:  zolpidem (AMBIEN CR) 12.5 MG CR tablet, Take 1 tablet (12.5 mg total) by mouth at bedtime as needed for sleep., Disp: 90 tablet, Rfl: 0  No current facility-administered medications on file prior to visit.   Past Medical History:   Osteoporosis                                                 Cirrhosis                                                    GI bleed                                                     Seizures  Heart valve problem                                            Comment:"2 leaking heart valves"  Past Surgical History:   LIVER TRANSPLANT                                 08/08       No family history on file.   Social History   Marital Status: Married             Spouse Name: [name]              Years of Education: 12th            Number of children: 3           Occupational History Occupation          Fish farm manager            Comment              Retired                                   Social History Main Topics   Smoking Status: Former Smoker                   Packs/Day: 0.00  Years:         Smokeless Status: Never Used                       Alcohol Use: No                Comment: h/o alcohol abuse   Drug Use: No             Sexual Activity: Not on file        Other Topics            Concern   None on file  Social History Narrative   Patient lives at home with husband [name].    Patient has 3 children.    Patient has a high school education level.    Patient is retired.     ROS: see history of present illness otherwise negative    PHYSICAL EXAM: Then, elderly , in no acute distress. Neck: No JVD, HJR, Bruit, or thyroid enlargement  Lungs: No tachypnea, clear without wheezing, rales, or  rhonchi  Cardiovascular: RRR, PMI not displaced,  5-7/8 diastolic murmur at the left sternal border, 2/6 systolic murmur at the apex, no  gallops, bruit, thrill, or heave.  Abdomen: BS normal. Soft without organomegaly, masses, lesions or tenderness.  Extremities: without cyanosis, clubbing or edema. Good distal pulses bilateral  SKin: Warm, no lesions or rashes   Musculoskeletal: No deformities  Neuro: no focal signs  BP 118/69  Pulse 60  Ht 5\' 1"  (1.549 m)  Wt 121 lb (54.885 kg)  BMI 22.87 kg/m2    EKG: normal sinus rhythm at 60 beats per minute   2-D echo 04/07/14 Study Conclusions  - Left ventricle: The cavity size was normal. Wall thickness   was increased in a pattern of mild LVH. Systolic function   was normal. The estimated ejection fraction was in the  range of 55% to 60%. Wall motion was normal; there were no   regional wall motion abnormalities. Features are   consistent with a pseudonormal left ventricular filling   pattern, with concomitant abnormal relaxation and   increased filling pressure (grade 2 diastolic   dysfunction). - Aortic valve: Trileaflet; mildly calcified leaflets. There   was no stenosis. Moderate regurgitation. - Mitral valve: Severely calcified annulus. Mildly calcified   leaflets . The findings are consistent with trivial   stenosis. Moderate regurgitation. Mean gradient: 75mm Hg   (D). - Left atrium: The atrium was moderately dilated. - Right ventricle: The cavity size was normal. Systolic   function was normal. - Tricuspid valve: Peak RV-RA gradient: 65mm Hg (S). - Pulmonary arteries: PA peak pressure: 23mm Hg (S). - Inferior vena cava: The vessel was normal in size; the   respirophasic diameter changes were in the normal range (=   50%); findings are consistent with normal central venous   pressure. Impressions:  - Normal LV size with mild LV hypertrophy, EF 55-60%.   Moderate diastolic dysfunction. Heavily calcified mitral    annulus with minimal mitral stenosis. Moderate mitral   regurgitation. Moderate aortic insufficiency. Normal RV   size and systolic function.

## 2014-05-19 ENCOUNTER — Encounter: Payer: Self-pay | Admitting: Physician Assistant

## 2014-06-07 DIAGNOSIS — Z298 Encounter for other specified prophylactic measures: Secondary | ICD-10-CM | POA: Diagnosis not present

## 2014-06-07 DIAGNOSIS — Z79899 Other long term (current) drug therapy: Secondary | ICD-10-CM | POA: Diagnosis not present

## 2014-06-07 DIAGNOSIS — Z940 Kidney transplant status: Secondary | ICD-10-CM | POA: Diagnosis not present

## 2014-06-07 DIAGNOSIS — Z944 Liver transplant status: Secondary | ICD-10-CM | POA: Diagnosis not present

## 2014-06-11 ENCOUNTER — Other Ambulatory Visit: Payer: Self-pay | Admitting: Internal Medicine

## 2014-07-07 DIAGNOSIS — Z944 Liver transplant status: Secondary | ICD-10-CM | POA: Diagnosis not present

## 2014-07-07 DIAGNOSIS — Z298 Encounter for other specified prophylactic measures: Secondary | ICD-10-CM | POA: Diagnosis not present

## 2014-07-15 ENCOUNTER — Telehealth: Payer: Self-pay | Admitting: Internal Medicine

## 2014-07-15 DIAGNOSIS — G4700 Insomnia, unspecified: Secondary | ICD-10-CM

## 2014-07-15 NOTE — Telephone Encounter (Signed)
[facility] is requesting re-fill on zolpidem (AMBIEN CR) 12.5 MG CR tablet

## 2014-07-19 NOTE — Telephone Encounter (Signed)
Received 2nd re-fill request on below medication. Pt scheduled to est with Dr. Yong Channel 12/13/14.

## 2014-07-19 NOTE — Telephone Encounter (Signed)
No. 6.25mg  max given age. She can have the half dose refilled or schedule visit to discuss sleep medicine only(but I will still not fill that dose).

## 2014-07-19 NOTE — Telephone Encounter (Signed)
Is this ok to refill?  

## 2014-07-20 MED ORDER — ZOLPIDEM TARTRATE ER 6.25 MG PO TBCR: 6.2500 mg | EXTENDED_RELEASE_TABLET | Freq: Every evening | ORAL | Status: DC | PRN

## 2014-07-20 NOTE — Telephone Encounter (Signed)
Pt mom states they will stick with the half dose until their appt in January and will make another appt if need be. She states the max dose that she is already on does not really help her mom so she dosent think that cutting the dose will work but they will try it for a few weeks

## 2014-07-20 NOTE — Telephone Encounter (Signed)
Called and LM on pt VM TCB

## 2014-07-20 NOTE — Telephone Encounter (Signed)
Printed and given to [facility] [place] to have prepared for patient to pick up.

## 2014-07-21 NOTE — Telephone Encounter (Signed)
Medication called in to [facility]

## 2014-09-18 ENCOUNTER — Other Ambulatory Visit: Payer: Self-pay | Admitting: Nurse Practitioner

## 2014-10-11 ENCOUNTER — Other Ambulatory Visit: Payer: Self-pay | Admitting: Neurology

## 2014-10-11 ENCOUNTER — Ambulatory Visit: Payer: Medicare Other | Admitting: *Deleted

## 2014-10-11 ENCOUNTER — Ambulatory Visit (INDEPENDENT_AMBULATORY_CARE_PROVIDER_SITE_OTHER): Payer: Medicare Other | Admitting: *Deleted

## 2014-10-11 DIAGNOSIS — Z23 Encounter for immunization: Secondary | ICD-10-CM

## 2014-10-12 ENCOUNTER — Telehealth: Payer: Self-pay | Admitting: Internal Medicine

## 2014-10-12 DIAGNOSIS — G4700 Insomnia, unspecified: Secondary | ICD-10-CM

## 2014-10-12 MED ORDER — ZOLPIDEM TARTRATE ER 6.25 MG PO TBCR
6.2500 mg | EXTENDED_RELEASE_TABLET | Freq: Every evening | ORAL | Status: DC | PRN
Start: 2014-10-12 — End: 2015-01-10

## 2014-10-12 NOTE — Telephone Encounter (Signed)
[facility] is requesting re-fill on zolpidem (AMBIEN CR) 6.25 MG CR tablet

## 2014-10-12 NOTE — Telephone Encounter (Signed)
Ok x1 per Dr [name], rx faxed to [facility]

## 2014-11-11 DIAGNOSIS — Z418 Encounter for other procedures for purposes other than remedying health state: Secondary | ICD-10-CM | POA: Diagnosis not present

## 2014-11-11 DIAGNOSIS — Z944 Liver transplant status: Secondary | ICD-10-CM | POA: Diagnosis not present

## 2014-11-19 ENCOUNTER — Other Ambulatory Visit: Payer: Self-pay | Admitting: Neurology

## 2014-11-19 ENCOUNTER — Telehealth: Payer: Self-pay | Admitting: Nurse Practitioner

## 2014-11-19 NOTE — Telephone Encounter (Signed)
Daughter calling to give heads up that [facility] will forward Rx refill request for levETIRAcetam (KEPPRA) 500 MG tablet.  FYI

## 2014-11-19 NOTE — Telephone Encounter (Signed)
I called back several times.  The line was busy each time I called.

## 2014-12-13 ENCOUNTER — Encounter: Payer: Self-pay | Admitting: Family Medicine

## 2014-12-13 ENCOUNTER — Ambulatory Visit (INDEPENDENT_AMBULATORY_CARE_PROVIDER_SITE_OTHER): Payer: Medicare Other | Admitting: Family Medicine

## 2014-12-13 VITALS — BP 114/64 | HR 68 | Temp 97.3°F | Ht 61.0 in | Wt 121.0 lb

## 2014-12-13 DIAGNOSIS — K219 Gastro-esophageal reflux disease without esophagitis: Secondary | ICD-10-CM | POA: Diagnosis not present

## 2014-12-13 DIAGNOSIS — G40909 Epilepsy, unspecified, not intractable, without status epilepticus: Secondary | ICD-10-CM | POA: Diagnosis not present

## 2014-12-13 DIAGNOSIS — G4700 Insomnia, unspecified: Secondary | ICD-10-CM

## 2014-12-13 DIAGNOSIS — E871 Hypo-osmolality and hyponatremia: Secondary | ICD-10-CM

## 2014-12-13 DIAGNOSIS — Z944 Liver transplant status: Secondary | ICD-10-CM

## 2014-12-13 NOTE — Assessment & Plan Note (Signed)
Risk of this on protonix, keppra, Prograf but small. Patient mildly dry on exam and admits to not drinking many fluids. Suspect this is hypovolemia related. If worsens, would need further investigation. Avoid SSRI if ever needed.

## 2014-12-13 NOTE — Assessment & Plan Note (Signed)
No seizures on keppra. We discussed should set up return visit to neurology. Would love to have physician input as to whether she will need life long therapy vs. Trial off medication.

## 2014-12-13 NOTE — Progress Notes (Signed)
Gina Reddish, MD Phone: [phone]  Subjective:  Patient presents today to establish care with me as their new primary care provider. Patient was formerly a patient of Dr. Leanne Young. 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 Completed at age 81 and she has done well since that time. She has been steady on tacrolimus. She had a seizure once but has been maintained on keppra since that time. This has been cared for by neuroogy. She has not seen them since 09/21/13 but medication has been maintained and she has been seizure free. Reviewed patients labs with her which show hyponatremia. She has been told in the past that she has mild dehydration by Dr. Leanne Young. She had one dizzy episode when hyponatremia was worse. We discussed increasing fluid intake as previously suggested. Her insomnia is well controlled with ambien sustained release (reasonable but still has some trouble sleeping). Her GERD has been well controlled on protonix.   ROS-No RUQ pain, nausea, vomiting. No confusion. No weakness. No vivid dreams. No chest pain or shortness of breath.   The following were reviewed and entered/updated in epic: Past Medical History  Diagnosis Date  . Osteoporosis   . Cirrhosis   . GI bleed     patient denies  . Seizures   . Heart valve problem     "2 leaking heart valves"   Patient Active Problem List   Diagnosis Date Noted  . Seizure disorder 09/21/2013    Priority: High  . Liver replaced by transplant 06/08/2010    Priority: High  . Hepatic cirrhosis 06/09/2007    Priority: High  . Hyponatremia 05/17/2014    Priority: Medium  . Aortic insufficiency 12/03/2012    Priority: Medium  . GERD (gastroesophageal reflux disease) 12/13/2014    Priority: Low  . Dizziness 05/17/2014    Priority: Low  . Insomnia 06/08/2010    Priority: Low  . Osteoporosis 06/09/2007    Priority: Low   Past Surgical History  Procedure Laterality Date  . Liver  transplant  08/08    Family History  Problem Relation Age of Onset  . Heart attack Mother 23    Medications- reviewed and updated Current Outpatient Prescriptions  Medication Sig Dispense Refill  . aspirin EC 81 MG tablet Take 81 mg by mouth daily.    . cholecalciferol (VITAMIN D) 1000 UNITS tablet Take 1,000 Units by mouth daily.      Marland Kitchen levETIRAcetam (KEPPRA) 500 MG tablet Take 1 tablet by mouth two  times daily 60 tablet 0  . Multiple Vitamin (MULTIVITAMIN) tablet Take 1 tablet by mouth daily.      . pantoprazole (PROTONIX) 40 MG tablet Take 1 tablet by mouth  daily 90 tablet 1  . tacrolimus (PROGRAF) 1 MG capsule Take 2-3 mg by mouth 2 (two) times daily. Take 2 capsules in the morning and 2 capsule a night    . zolpidem (AMBIEN CR) 6.25 MG CR tablet Take 1 tablet (6.25 mg total) by mouth at bedtime as needed for sleep. 90 tablet 0   Allergies-reviewed and updated Allergies  Allergen Reactions  . Nsaids Other (See Comments)  . Codeine Rash    History   Social History  . Marital Status: Married    Spouse Name: [name]     Number of Children: 3  . Years of Education: 12th   Occupational History  . Retired    Social History Main Topics  . Smoking status: Former Research scientist (life sciences)  . Smokeless  tobacco: Never Used  . Alcohol Use: No     Comment: h/o alcohol abuse  . Drug Use: No  . Sexual Activity: None   Other Topics Concern  . None   Social History Narrative   Patient lives at home with husband [name].    Daughter lives with patient and works from home.       Patient has 3 children. 4 grandchildren. No greatgrandchildren.    Patient has a high school education level.    Patient is retired from [employer], cared for her children      Hobbies: dancing ([place] singing group)-fewer and fewer get togethers    ROS--See HPI   Objective: BP 114/64 mmHg  Pulse 68  Temp(Src) 97.3 F (36.3 C) (Oral)  Ht 5\' 1"  (1.549 m)  Wt 121 lb (54.885 kg)  BMI 22.87 kg/m2 Gen: NAD,  resting comfortably HEENT: slight dry mucus membranes. Dentures in place CV: RRR no murmurs rubs or gallops Lungs: CTAB no crackles, wheeze, rhonchi Abdomen: soft/nontender/nondistended/normal bowel sounds.  Ext: no edema Skin: warm, dry, no rash Neuro: grossly normal, moves all extremities, PERRLA   Assessment/Plan:  Seizure disorder No seizures on keppra. We discussed should set up return visit to neurology. Would love to have physician input as to whether she will need life long therapy vs. Trial off medication.   Liver replaced by transplant Continues to follow with [facility]. Has done well.   Hyponatremia Risk of this on protonix, keppra, Prograf but small. Patient mildly dry on exam and admits to not drinking many fluids. Suspect this is hypovolemia related. If worsens, would need further investigation. Avoid SSRI if ever needed.   Insomnia Continue ambien as reasonably controlled.  GERD (gastroesophageal reflux disease) Controlled on protonix-continue   Return precautions advised. Patient plans 1 year follow up. Sees [facility] in June for liver transplant follow up and continues q3 month labs through [facility].

## 2014-12-13 NOTE — Assessment & Plan Note (Signed)
Continues to follow with [facility]. Has done well.

## 2014-12-13 NOTE — Assessment & Plan Note (Signed)
Controlled on protonix-continue

## 2014-12-13 NOTE — Patient Instructions (Addendum)
Sign designated party release at front desk for your children.   Thank you for coming in today!   We will see you in a year unless you need [place] sooner.   Try to drink more fluids, you seem a little dry on exam and you have low sodium and that may be the cause. If this ever worsens on labs, we may need to look into it further

## 2014-12-13 NOTE — Assessment & Plan Note (Signed)
Continue ambien as reasonably controlled.

## 2014-12-13 NOTE — Progress Notes (Signed)
Pre visit review using our clinic review tool, if applicable. No additional management support is needed unless otherwise documented below in the visit note. 

## 2014-12-21 ENCOUNTER — Other Ambulatory Visit: Payer: Self-pay | Admitting: Neurology

## 2014-12-22 NOTE — Telephone Encounter (Signed)
Patient hasn't been seen in over 1 year.  I called, got no answer.  Left message.

## 2014-12-22 NOTE — Telephone Encounter (Signed)
Patient has scheduled appt 

## 2015-01-10 ENCOUNTER — Other Ambulatory Visit: Payer: Self-pay

## 2015-01-10 DIAGNOSIS — G4700 Insomnia, unspecified: Secondary | ICD-10-CM

## 2015-01-10 NOTE — Telephone Encounter (Signed)
Is this ok to refill?  

## 2015-01-10 NOTE — Telephone Encounter (Signed)
Yes may fill 6 mo

## 2015-01-10 NOTE — Telephone Encounter (Signed)
Rx request for Zolpidem ER 6.25 mg tablet-Take 1 tablet at bedtime.   Pharm:  [facility]

## 2015-01-11 MED ORDER — ZOLPIDEM TARTRATE ER 6.25 MG PO TBCR: 6.2500 mg | EXTENDED_RELEASE_TABLET | Freq: Every evening | ORAL | Status: DC | PRN

## 2015-01-11 NOTE — Telephone Encounter (Signed)
Medication faxed to [facility]

## 2015-01-19 ENCOUNTER — Telehealth: Payer: Self-pay

## 2015-01-19 MED ORDER — PANTOPRAZOLE SODIUM 40 MG PO TBEC: DELAYED_RELEASE_TABLET | ORAL | Status: DC

## 2015-01-19 NOTE — Telephone Encounter (Signed)
Medication refilled

## 2015-01-19 NOTE — Telephone Encounter (Signed)
[facility] refill request for PROTONIX TAB

## 2015-01-27 ENCOUNTER — Telehealth: Payer: Self-pay | Admitting: *Deleted

## 2015-01-27 ENCOUNTER — Ambulatory Visit: Payer: Medicare Other | Admitting: Neurology

## 2015-01-27 NOTE — Telephone Encounter (Signed)
No showed appt 

## 2015-01-28 ENCOUNTER — Encounter: Payer: Self-pay | Admitting: Neurology

## 2015-02-02 ENCOUNTER — Ambulatory Visit (INDEPENDENT_AMBULATORY_CARE_PROVIDER_SITE_OTHER): Payer: Medicare Other | Admitting: Neurology

## 2015-02-02 ENCOUNTER — Encounter: Payer: Self-pay | Admitting: Neurology

## 2015-02-02 VITALS — BP 136/73 | HR 74 | Ht 61.0 in | Wt 121.0 lb

## 2015-02-02 DIAGNOSIS — Z944 Liver transplant status: Secondary | ICD-10-CM

## 2015-02-02 DIAGNOSIS — G40909 Epilepsy, unspecified, not intractable, without status epilepticus: Secondary | ICD-10-CM

## 2015-02-02 MED ORDER — LEVETIRACETAM 500 MG PO TABS: 500.0000 mg | ORAL_TABLET | Freq: Two times a day (BID) | ORAL | Status: DC

## 2015-02-02 NOTE — Progress Notes (Signed)
[facility]  PATIENT: [name] DOB: 01-30-1932   REASON FOR VISIT: Followup for isolated seizure event  HISTORY OF PRESENT ILLNESS: [name], 81 year old white female returns for followup of seizure, her primary care physician is Dr. Rushie Young, She is accompanied by her daughter [name]. She was last seen by [name] in Oct 2014, she was a patient of Dr. Gaynell Young in the past.  She has a history of advanced alcoholic cirrhosis, portal hypertension, splenomegaly, pelvic varices and GI bleed. She has also had shortness of breath attributed to COPD and significant right to left shunt at the pulmonary level related to pulmonary AV fistula. She was hospitalized and had code stroke, but the patient actually had 2 seizures while she was at hospital for dehydration in April 2008, before liver transplant. MRI brain in April 2008: Three areas of blood breakdown products. Two of these areas are linear and do not have significant surrounding vasogenic edema (left parietal-frontal and right parietal-temporal lesions); however, the left temporal lobe abnormality is rounded in configuration with surrounding white matter type changes suggestive of mild vasogenic edema and demonstrates minimal enhancement. The exact etiology of these findings is indeterminate and may be related to underlying vascular malformation, congophilic angiopathy, hemorrhagic ischemic type changes, or result of thrombosed cortical vessel with tumor felt to be a less likely consideration. Hepatocellular changes best seen on precontrast T1 imaging involving the lenticular nucleus and cerebral peduncles.  Atrophy and mild to moderate nonspecific white matter type changes. No acute thrombotic infarct. MR angiography was unremarkable. She then had a liver transplant in August 2008, at [facility],  and has done extremely well. EEG 08/17/2008 was normal.   She is currently on levetiracetam 500 twice daily. She has no  recurrent seizure, She has had no further seizure events, she lives at home with her family, highly function, still driving, no significant memory trouble  REVIEW OF SYSTEMS: Full 14 system review of systems performed and notable only for: As above   ALLERGIES: Allergies  Allergen Reactions  . Nsaids Other (See Comments)  . Codeine Rash    HOME MEDICATIONS: Outpatient Prescriptions Prior to Visit  Medication Sig Dispense Refill  . aspirin EC 81 MG tablet Take 81 mg by mouth daily.    . cholecalciferol (VITAMIN D) 1000 UNITS tablet Take 1,000 Units by mouth daily.      Marland Kitchen levETIRAcetam (KEPPRA) 500 MG tablet Take 1 tablet by mouth two  times daily 180 tablet 0  . Multiple Vitamin (MULTIVITAMIN) tablet Take 1 tablet by mouth daily.      . pantoprazole (PROTONIX) 40 MG tablet Take 1 tablet by mouth  daily 90 tablet 1  . tacrolimus (PROGRAF) 1 MG capsule Take 2-3 mg by mouth 2 (two) times daily. Take 2 capsules in the morning and 2 capsule a night    . zolpidem (AMBIEN CR) 6.25 MG CR tablet Take 1 tablet (6.25 mg total) by mouth at bedtime as needed for sleep. 90 tablet 5   No facility-administered medications prior to visit.    PAST MEDICAL HISTORY: Past Medical History  Diagnosis Date  . Osteoporosis   . Cirrhosis   . GI bleed     patient denies  . Seizures   . Heart valve problem     "2 leaking heart valves"    PAST SURGICAL HISTORY: Past Surgical History  Procedure Laterality Date  . Liver transplant  08/08    FAMILY HISTORY: Family History  Problem Relation Age of  Onset  . Heart attack Mother 13    SOCIAL HISTORY: History   Social History  . Marital Status: Married    Spouse Name: [name]     Number of Children: 3  . Years of Education: 12th   Occupational History  . Retired    Social History Main Topics  . Smoking status: Former Research scientist (life sciences)  . Smokeless tobacco: Never Used  . Alcohol Use: No     Comment: h/o alcohol abuse  . Drug Use: No  . Sexual  Activity: Not on file   Other Topics Concern  . Not on file   Social History Narrative   Patient lives at home with husband [name].    Patient has 3 children.    Patient has a high school education level.    Patient is retired.      PHYSICAL EXAM  Filed Vitals:   02/02/15 1034  BP: 136/73  Pulse: 74  Height: 5\' 1"  (1.549 m)  Weight: 121 lb (54.885 kg)   Body mass index is 22.87 kg/(m^2). PHYSICAL EXAMNIATION:  Gen: NAD, conversant, well nourised, obese, well groomed                     Cardiovascular: Regular rate rhythm, no peripheral edema, warm, nontender. Eyes: Conjunctivae clear without exudates or hemorrhage Neck: Supple, no carotid bruise. Pulmonary: Clear to auscultation bilaterally   NEUROLOGICAL EXAM:  MENTAL STATUS: Speech:    Speech is normal; fluent and spontaneous with normal comprehension.  Cognition:    The patient is oriented to person, place, and time;     recent and remote memory intact;     language fluent;     normal attention, concentration,     fund of knowledge.  CRANIAL NERVES: CN II: Visual fields are full to confrontation. Fundoscopic exam is normal with sharp discs and no vascular changes. Venous pulsations are present bilaterally. Pupils are 4 mm and briskly reactive to light. Visual acuity is 20/20 bilaterally. CN III, IV, VI: extraocular movement are normal. No ptosis. CN V: Facial sensation is intact to pinprick in all 3 divisions bilaterally. Corneal responses are intact.  CN VII: Young is symmetric with normal eye closure and smile. CN VIII: Hearing is normal to rubbing fingers CN IX, X: Palate elevates symmetrically. Phonation is normal. CN XI: Head turning and shoulder shrug are intact CN XII: Tongue is midline with normal movements and no atrophy.  MOTOR: There is no pronator drift of out-stretched arms. Muscle bulk and tone are normal. Muscle strength is normal.   Shoulder abduction Shoulder external rotation Elbow flexion  Elbow extension Wrist flexion Wrist extension Finger abduction Hip flexion Knee flexion Knee extension Ankle dorsi flexion Ankle plantar flexion  R 5 5 5 5 5 5 5 5 5 5 5 5   L 5 5 5 5 5 5 5 5 5 5 5 5     REFLEXES: Reflexes are 2+ and symmetric at the biceps, triceps, knees, and ankles. Plantar responses are flexor.  SENSORY: Light touch, pinprick, position sense, and vibration sense are intact in fingers and toes.  COORDINATION: Rapid alternating movements and fine finger movements are intact. There is no dysmetria on finger-to-nose and heel-knee-shin. There are no abnormal or extraneous movements.   GAIT/STANCE: Posture is normal. Gait is steady with normal steps, base, arm swing, and turning. Heel and toe walking are normal. Tandem gait is normal.  Romberg is absent.   DATA (LABS, IMAGING, TESTING) None for review  ASSESSMENT AND PLAN  81 y.o. year old female  has a past medical history of  2 seizures in the setting of hepatic failure in April 2008, she had liver transplant in August 2008, doing extremely well, no recurrent seizure, she is taking Keppra 500 mg grams twice a day, she is questioning whether she should continue with the medication,  1, repeat MRI of the brain without contrast. EEG, 2, return to clinic in 3 4 weeks, if there is no acute lesions, normal EEG, may consider tapering her off Keppra.  Marcial Pacas, M.D. Ph.D.  [facility] [place] [place], [place] [address] Phone: [phone] Fax:      [phone]

## 2015-02-08 DIAGNOSIS — Z944 Liver transplant status: Secondary | ICD-10-CM | POA: Diagnosis not present

## 2015-02-08 DIAGNOSIS — Z418 Encounter for other procedures for purposes other than remedying health state: Secondary | ICD-10-CM | POA: Diagnosis not present

## 2015-02-10 ENCOUNTER — Ambulatory Visit (INDEPENDENT_AMBULATORY_CARE_PROVIDER_SITE_OTHER): Payer: Medicare Other | Admitting: Neurology

## 2015-02-10 DIAGNOSIS — G40909 Epilepsy, unspecified, not intractable, without status epilepticus: Secondary | ICD-10-CM | POA: Diagnosis not present

## 2015-02-10 DIAGNOSIS — Z944 Liver transplant status: Secondary | ICD-10-CM

## 2015-02-11 NOTE — Procedures (Signed)
   HISTORY:  81 years old female, with 2 seizures in the setting of recurrent hepatic failure in 2008  TECHNIQUE:  16 channel EEG was performed based on standard 10-16 international system. One channel was dedicated to EKG, which has demonstrates normal sinus rhythm  Upon awakening, the posterior background activity was well-developed, in alpha range 9 Hz, with amplitude of microvoltage, reactive to eye opening and closure.  There was no evidence of epileptiform discharge.  Photic stimulation was performed, which induced a symmetric photic driving.  Hyperventilation was performed, there was no abnormality elicit.  No sleep was achieved.  CONCLUSION: This is a  normal awake EEG.  There is no electrodiagnostic evidence of epileptiform discharge

## 2015-02-12 ENCOUNTER — Ambulatory Visit
Admission: RE | Admit: 2015-02-12 | Discharge: 2015-02-12 | Disposition: A | Discharge status: Home / Self Care | Payer: Medicare Other | Source: Ambulatory Visit | Attending: Neurology | Admitting: Neurology

## 2015-02-12 DIAGNOSIS — Z944 Liver transplant status: Secondary | ICD-10-CM

## 2015-02-12 DIAGNOSIS — G40909 Epilepsy, unspecified, not intractable, without status epilepticus: Secondary | ICD-10-CM

## 2015-02-15 ENCOUNTER — Telehealth: Payer: Self-pay | Admitting: Neurology

## 2015-02-15 NOTE — Telephone Encounter (Signed)
[name]: Please call patient, MRI of the brain showed age-related changes, I will go over detail with her in her follow-up visit. EEG was normal.  IMPRESSION: This is an abnormal MRI of the brain without contrast showing the following: 1. 6 foci consistent with hemosiderin deposition. Four of these are linear and follow sulci and could represent superficial hemosiderosis that can be seen in cerebral amyloid angiopathy. 2 of the foci are more consistent with microbleeds that could also be seen amyloid related changes. 2. Scattered T2 hyperintense foci in the hemispheres consistent with moderate small vessel ischemic changes related to aging.

## 2015-02-15 NOTE — Telephone Encounter (Signed)
Patient's daughter returned call regarding MRI results.

## 2015-02-15 NOTE — Telephone Encounter (Signed)
Left message for a return call

## 2015-02-15 NOTE — Telephone Encounter (Signed)
Pt and dgt aware of results - will keep f/u appt to further discuss.

## 2015-02-22 ENCOUNTER — Encounter: Payer: Self-pay | Admitting: Neurology

## 2015-02-22 ENCOUNTER — Ambulatory Visit (INDEPENDENT_AMBULATORY_CARE_PROVIDER_SITE_OTHER): Payer: Medicare Other | Admitting: Neurology

## 2015-02-22 VITALS — BP 122/58 | HR 68 | Ht 61.0 in | Wt 121.0 lb

## 2015-02-22 DIAGNOSIS — G40909 Epilepsy, unspecified, not intractable, without status epilepticus: Secondary | ICD-10-CM

## 2015-02-22 DIAGNOSIS — Z944 Liver transplant status: Secondary | ICD-10-CM | POA: Diagnosis not present

## 2015-02-22 MED ORDER — LEVETIRACETAM 250 MG PO TABS: 250.0000 mg | ORAL_TABLET | Freq: Two times a day (BID) | ORAL | Status: DC

## 2015-02-22 NOTE — Progress Notes (Signed)
[facility]  PATIENT: [name] DOB: August 26, 1932   REASON FOR VISIT: Followup for isolated seizure event  HISTORY OF PRESENT ILLNESS: [name], 81 year old white female returns for followup of seizure, her primary care physician is Dr. Rushie Chestnut, She is accompanied by her daughter [name]. She was last seen by Hoyle Sauer in Oct 2014, she was a patient of Dr. Gaynell Face in the past.  She has a history of advanced alcoholic cirrhosis, portal hypertension, splenomegaly, pelvic varices and GI bleed. She has also had shortness of breath attributed to COPD and significant right to left shunt at the pulmonary level related to pulmonary AV fistula. She was hospitalized and had code stroke, but the patient actually had 2 seizures while she was at hospital for dehydration in April 2008, before liver transplant. MRI brain in April 2008: Three areas of blood breakdown products. Two of these areas are linear and do not have significant surrounding vasogenic edema (left parietal-frontal and right parietal-temporal lesions); however, the left temporal lobe abnormality is rounded in configuration with surrounding white matter type changes suggestive of mild vasogenic edema and demonstrates minimal enhancement. The exact etiology of these findings is indeterminate and may be related to underlying vascular malformation, congophilic angiopathy, hemorrhagic ischemic type changes, or result of thrombosed cortical vessel with tumor felt to be a less likely consideration. Hepatocellular changes best seen on precontrast T1 imaging involving the lenticular nucleus and cerebral peduncles.  Atrophy and mild to moderate nonspecific white matter type changes. No acute thrombotic infarct. MR angiography was unremarkable. She then had a liver transplant in August 2008, at [facility]  and has done extremely well. EEG 08/17/2008 was normal.   She is currently on levetiracetam 500 twice daily. She has no  recurrent seizure, She has had no further seizure events, she lives at home with her family, highly function, still driving, no significant memory trouble  UPDATE March 22nd 2016: We have reviewed MRI of the brain 6 foci consistent with hemosiderin deposition. Four of these are linear and follow sulci and could represent superficial hemosiderosis that can be seen in cerebral amyloid angiopathy. 2 of the foci are more consistent with microbleeds that could also be seen amyloid related changes. Scattered T2 hyperintense foci in the hemispheres consistent with moderate small vessel ischemic changes related to aging.  EEG was normal  Both patient, and her daughter other hesitate to stop and to left it medications reviewed the potential side effect, including reported long term bone density loss,  REVIEW OF SYSTEMS: Full 14 system review of systems performed and notable only for: As above   ALLERGIES: Allergies  Allergen Reactions  . Nsaids Other (See Comments)  . Codeine Rash    HOME MEDICATIONS: Outpatient Prescriptions Prior to Visit  Medication Sig Dispense Refill  . aspirin EC 81 MG tablet Take 81 mg by mouth daily.    . cholecalciferol (VITAMIN D) 1000 UNITS tablet Take 1,000 Units by mouth daily.      Marland Kitchen levETIRAcetam (KEPPRA) 500 MG tablet Take 1 tablet (500 mg total) by mouth 2 (two) times daily. 60 tablet 3  . Multiple Vitamin (MULTIVITAMIN) tablet Take 1 tablet by mouth daily.      . pantoprazole (PROTONIX) 40 MG tablet Take 1 tablet by mouth  daily 90 tablet 1  . tacrolimus (PROGRAF) 1 MG capsule Take 2-3 mg by mouth 2 (two) times daily. Take 2 capsules in the morning and 2 capsule a night    . zolpidem (AMBIEN CR)  6.25 MG CR tablet Take 1 tablet (6.25 mg total) by mouth at bedtime as needed for sleep. 90 tablet 5   No facility-administered medications prior to visit.    PAST MEDICAL HISTORY: Past Medical History  Diagnosis Date  . Osteoporosis   . Cirrhosis   . GI bleed       patient denies  . Seizures   . Heart valve problem     "2 leaking heart valves"    PAST SURGICAL HISTORY: Past Surgical History  Procedure Laterality Date  . Liver transplant  08/08    FAMILY HISTORY: Family History  Problem Relation Age of Onset  . Heart attack Mother 38    SOCIAL HISTORY: History   Social History  . Marital Status: Married    Spouse Name: [name]     Number of Children: 3  . Years of Education: 12th   Occupational History  . Retired    Social History Main Topics  . Smoking status: Former Research scientist (life sciences)  . Smokeless tobacco: Never Used  . Alcohol Use: No     Comment: h/o alcohol abuse  . Drug Use: No  . Sexual Activity: Not on file   Other Topics Concern  . Not on file   Social History Narrative   Patient lives at home with husband [name].    Patient has 3 children.    Patient has a high school education level.    Patient is retired.      PHYSICAL EXAM  Filed Vitals:   02/22/15 1108  BP: 122/58  Pulse: 68  Height: 5\' 1"  (1.549 m)  Weight: 121 lb (54.885 kg)   Body mass index is 22.87 kg/(m^2). PHYSICAL EXAMNIATION:  Gen: NAD, conversant, well nourised, obese, well groomed                     Cardiovascular: Regular rate rhythm, no peripheral edema, warm, nontender. Eyes: Conjunctivae clear without exudates or hemorrhage Neck: Supple, no carotid bruise. Pulmonary: Clear to auscultation bilaterally   NEUROLOGICAL EXAM:  MENTAL STATUS: Speech:    Speech is normal; fluent and spontaneous with normal comprehension.  Cognition:    The patient is oriented to person, place, and time;     recent and remote memory intact;     language fluent;     normal attention, concentration,     fund of knowledge.  CRANIAL NERVES: CN II: Visual fields are full to confrontation. Fundoscopic exam is normal with sharp discs and no vascular changes. Venous pulsations are present bilaterally. Pupils are 4 mm and briskly reactive to light. Visual  acuity is 20/20 bilaterally. CN III, IV, VI: extraocular movement are normal. No ptosis. CN V: Facial sensation is intact to pinprick in all 3 divisions bilaterally. Corneal responses are intact.  CN VII: Face is symmetric with normal eye closure and smile. CN VIII: Hearing is normal to rubbing fingers CN IX, X: Palate elevates symmetrically. Phonation is normal. CN XI: Head turning and shoulder shrug are intact CN XII: Tongue is midline with normal movements and no atrophy.  MOTOR: There is no pronator drift of out-stretched arms. Muscle bulk and tone are normal. Muscle strength is normal.   Shoulder abduction Shoulder external rotation Elbow flexion Elbow extension Wrist flexion Wrist extension Finger abduction Hip flexion Knee flexion Knee extension Ankle dorsi flexion Ankle plantar flexion  R 5 5 5 5 5 5 5 5 5 5 5 5   L 5 5 5 5 5 5 5  5  5 5 5 5     REFLEXES: Reflexes are 2+ and symmetric at the biceps, triceps, knees, and ankles. Plantar responses are flexor.  SENSORY: Light touch, pinprick, position sense, and vibration sense are intact in fingers and toes.  COORDINATION: Rapid alternating movements and fine finger movements are intact. There is no dysmetria on finger-to-nose and heel-knee-shin. There are no abnormal or extraneous movements.   GAIT/STANCE: Posture is normal. Gait is steady with normal steps, base, arm swing, and turning. Heel and toe walking are normal. Tandem gait is normal.  Romberg is absent.   DATA (LABS, IMAGING, TESTING) None for review   ASSESSMENT AND PLAN  81 y.o. year old female  has a past medical history of  2 seizures in the setting of hepatic failure in April 2008, she had liver transplant in August 2008, doing extremely well, no recurrent seizure, she is taking Keppra 500 mg grams twice a day, she is questioning whether she should continue with the medication,  We have reviewed MRI of the brain 6 foci consistent with hemosiderin deposition. Four  of these are linear and follow sulci and could represent superficial hemosiderosis that can be seen in cerebral amyloid angiopathy. 2 of the foci are more consistent with microbleeds that could also be seen amyloid related changes. Scattered T2 hyperintense foci in the hemispheres consistent with moderate small vessel ischemic changes related to aging.  EEG was normal  Both patient, and her daughter other hesitate to stop and to left it medications reviewed the potential side effect, including reported long term bone density loss,  We decided to decrease Keppra to 500 mg half tablets twice a day, return to clinic in one year   Marcial Pacas, M.D. Ph.D.  [facility] [place],  [address] Phone: [phone] Fax:      [phone]

## 2015-05-10 ENCOUNTER — Telehealth: Payer: Self-pay | Admitting: Cardiovascular Disease

## 2015-05-10 DIAGNOSIS — I351 Nonrheumatic aortic (valve) insufficiency: Secondary | ICD-10-CM

## 2015-05-10 NOTE — Telephone Encounter (Signed)
Pt's daughter called to see if pt needed to have an echocardiogram done. On the last echo results, Dr. Johnsie Cancel recommends for pt to repeat echo in a year. Echo was order was placed in [facility]. Pt is aware that a scheduler will call pt to scheduled echo. Pt's daughter verbalized understanding.

## 2015-05-10 NOTE — Telephone Encounter (Signed)
New problem   Pt's daughter want to know if pt need echocardiogram and if so they is no order entered.

## 2015-05-18 ENCOUNTER — Other Ambulatory Visit: Payer: Self-pay

## 2015-05-18 ENCOUNTER — Ambulatory Visit (HOSPITAL_COMMUNITY): Payer: Medicare Other | Attending: Cardiology

## 2015-05-18 DIAGNOSIS — I340 Nonrheumatic mitral (valve) insufficiency: Secondary | ICD-10-CM | POA: Insufficient documentation

## 2015-05-18 DIAGNOSIS — I059 Rheumatic mitral valve disease, unspecified: Secondary | ICD-10-CM | POA: Insufficient documentation

## 2015-05-18 DIAGNOSIS — I358 Other nonrheumatic aortic valve disorders: Secondary | ICD-10-CM | POA: Diagnosis not present

## 2015-05-18 DIAGNOSIS — I351 Nonrheumatic aortic (valve) insufficiency: Secondary | ICD-10-CM

## 2015-05-18 DIAGNOSIS — I371 Nonrheumatic pulmonary valve insufficiency: Secondary | ICD-10-CM | POA: Insufficient documentation

## 2015-05-18 DIAGNOSIS — I77810 Thoracic aortic ectasia: Secondary | ICD-10-CM | POA: Insufficient documentation

## 2015-05-23 DIAGNOSIS — Z944 Liver transplant status: Secondary | ICD-10-CM | POA: Diagnosis not present

## 2015-05-23 DIAGNOSIS — Z418 Encounter for other procedures for purposes other than remedying health state: Secondary | ICD-10-CM | POA: Diagnosis not present

## 2015-05-30 ENCOUNTER — Other Ambulatory Visit: Payer: Self-pay

## 2015-06-04 ENCOUNTER — Other Ambulatory Visit: Payer: Self-pay | Admitting: Neurology

## 2015-06-04 ENCOUNTER — Other Ambulatory Visit: Payer: Self-pay | Admitting: Family Medicine

## 2015-06-05 ENCOUNTER — Other Ambulatory Visit: Payer: Self-pay

## 2015-06-05 MED ORDER — LEVETIRACETAM 250 MG PO TABS: 250.0000 mg | ORAL_TABLET | Freq: Two times a day (BID) | ORAL | Status: DC

## 2015-06-05 NOTE — Telephone Encounter (Signed)
Last OV note says: We decided to decrease Keppra to 500 mg half tablets twice a day

## 2015-06-30 ENCOUNTER — Other Ambulatory Visit: Payer: Self-pay | Admitting: Family Medicine

## 2015-06-30 DIAGNOSIS — G4700 Insomnia, unspecified: Secondary | ICD-10-CM

## 2015-06-30 MED ORDER — ZOLPIDEM TARTRATE ER 6.25 MG PO TBCR: 6.2500 mg | EXTENDED_RELEASE_TABLET | Freq: Every evening | ORAL | Status: DC | PRN

## 2015-06-30 NOTE — Telephone Encounter (Signed)
Refill ok? 

## 2015-06-30 NOTE — Telephone Encounter (Signed)
Yes thanks 

## 2015-08-17 DIAGNOSIS — Z418 Encounter for other procedures for purposes other than remedying health state: Secondary | ICD-10-CM | POA: Diagnosis not present

## 2015-08-17 DIAGNOSIS — Z944 Liver transplant status: Secondary | ICD-10-CM | POA: Diagnosis not present

## 2015-08-31 ENCOUNTER — Telehealth: Payer: Self-pay | Admitting: *Deleted

## 2015-08-31 ENCOUNTER — Ambulatory Visit (INDEPENDENT_AMBULATORY_CARE_PROVIDER_SITE_OTHER): Payer: Medicare Other | Admitting: Neurology

## 2015-08-31 ENCOUNTER — Encounter: Payer: Self-pay | Admitting: Neurology

## 2015-08-31 VITALS — Ht 61.0 in | Wt 117.0 lb

## 2015-08-31 DIAGNOSIS — Z944 Liver transplant status: Secondary | ICD-10-CM | POA: Diagnosis not present

## 2015-08-31 DIAGNOSIS — G40909 Epilepsy, unspecified, not intractable, without status epilepticus: Secondary | ICD-10-CM | POA: Diagnosis not present

## 2015-08-31 DIAGNOSIS — G4700 Insomnia, unspecified: Secondary | ICD-10-CM | POA: Diagnosis not present

## 2015-08-31 NOTE — Telephone Encounter (Signed)
Arrived late for her follow up appt.

## 2015-08-31 NOTE — Progress Notes (Signed)
Chief Complaint  Patient presents with  . Seizures    She is here with her daughter, Lyndel Pleasure.  She has not had any seizure activity.  No new concerns.     [facility]  PATIENT: [name] DOB: 08-Apr-1932   REASON FOR VISIT: Followup for isolated seizure event  HISTORY OF PRESENT ILLNESS: [name], 81 year old white female returns for followup of seizure, her primary care physician is Dr. Rushie Chestnut, She is accompanied by her daughter [name]. She was last seen by Hoyle Sauer in Oct 2014, she was a patient of Dr. Gaynell Face in the past.  She has a history of advanced alcoholic cirrhosis, portal hypertension, splenomegaly, pelvic varices and GI bleed. She has also had shortness of breath attributed to COPD and significant right to left shunt at the pulmonary level related to pulmonary AV fistula. She was hospitalized and had code stroke, but the patient actually had 2 seizures while she was at hospital for dehydration in April 2008, before liver transplant. MRI brain in April 2008: Three areas of blood breakdown products. Two of these areas are linear and do not have significant surrounding vasogenic edema (left parietal-frontal and right parietal-temporal lesions); however, the left temporal lobe abnormality is rounded in configuration with surrounding white matter type changes suggestive of mild vasogenic edema and demonstrates minimal enhancement. The exact etiology of these findings is indeterminate and may be related to underlying vascular malformation, congophilic angiopathy, hemorrhagic ischemic type changes, or result of thrombosed cortical vessel with tumor felt to be a less likely consideration. Hepatocellular changes best seen on precontrast T1 imaging involving the lenticular nucleus and cerebral peduncles.  Atrophy and mild to moderate nonspecific white matter type changes. No acute thrombotic infarct. MR angiography was unremarkable. She then had a liver  transplant in August 2008, at [facility],  and has done extremely well. EEG 08/17/2008 was normal.   She is currently on levetiracetam 500 twice daily. She has no recurrent seizure, She has had no further seizure events, she lives at home with her family, highly function, still driving, no significant memory trouble  UPDATE March 22nd 2016: We have reviewed MRI of the brain 6 foci consistent with hemosiderin deposition. Four of these are linear and follow sulci and could represent superficial hemosiderosis that can be seen in cerebral amyloid angiopathy. 2 of the foci are more consistent with microbleeds that could also be seen amyloid related changes. Scattered T2 hyperintense foci in the hemispheres consistent with moderate small vessel ischemic changes related to aging.  EEG was normal  Both patient, and her daughter are hesitate to stop and to left it medications reviewed the potential side effect, including reported long term bone density loss  Update August 31 2015: She is with her daughter at today's clinical visit, she has no recurrent seizure, able to tolerating lower dose of Keppra to 50 mg twice a day, She complains of chronic insomnia, taking Ambien as needed  REVIEW OF SYSTEMS: Full 14 system review of systems performed and notable only for, as above   ALLERGIES: Allergies  Allergen Reactions  . Nsaids Other (See Comments)  . Codeine Rash    HOME MEDICATIONS: Outpatient Prescriptions Prior to Visit  Medication Sig Dispense Refill  . aspirin EC 81 MG tablet Take 81 mg by mouth daily.    . cholecalciferol (VITAMIN D) 1000 UNITS tablet Take 1,000 Units by mouth daily.      Marland Kitchen levETIRAcetam (KEPPRA) 250 MG tablet Take 1 tablet (250 mg total) by  mouth 2 (two) times daily. 180 tablet 3  . Multiple Vitamin (MULTIVITAMIN) tablet Take 1 tablet by mouth daily.      . pantoprazole (PROTONIX) 40 MG tablet Take 1 tablet by mouth  daily 90 tablet 3  . tacrolimus (PROGRAF) 1 MG capsule Take  2-3 mg by mouth 2 (two) times daily. Take 2 capsules in the morning and 2 capsule a night    . zolpidem (AMBIEN CR) 6.25 MG CR tablet Take 1 tablet (6.25 mg total) by mouth at bedtime as needed for sleep. 90 tablet 3  . levETIRAcetam (KEPPRA) 250 MG tablet Take 1 tablet (250 mg total) by mouth 2 (two) times daily.     No facility-administered medications prior to visit.    PAST MEDICAL HISTORY: Past Medical History  Diagnosis Date  . Osteoporosis   . Cirrhosis   . GI bleed     patient denies  . Seizures   . Heart valve problem     "2 leaking heart valves"    PAST SURGICAL HISTORY: Past Surgical History  Procedure Laterality Date  . Liver transplant  08/08    FAMILY HISTORY: Family History  Problem Relation Age of Onset  . Heart attack Mother 62    SOCIAL HISTORY: History   Social History  . Marital Status: Married    Spouse Name: [name]     Number of Children: 3  . Years of Education: 12th   Occupational History  . Retired    Social History Main Topics  . Smoking status: Former Research scientist (life sciences)  . Smokeless tobacco: Never Used  . Alcohol Use: No     Comment: h/o alcohol abuse  . Drug Use: No  . Sexual Activity: Not on file   Other Topics Concern  . Not on file   Social History Narrative   Patient lives at home with husband [name].    Patient has 3 children.    Patient has a high school education level.    Patient is retired.      PHYSICAL EXAM  Filed Vitals:   08/31/15 1045  Height: 5\' 1"  (1.549 m)  Weight: 117 lb (53.071 kg)   Body mass index is 22.12 kg/(m^2). PHYSICAL EXAMNIATION:  Gen: NAD, conversant, well nourised, obese, well groomed                     Cardiovascular: Regular rate rhythm, no peripheral edema, warm, nontender. Eyes: Conjunctivae clear without exudates or hemorrhage Neck: Supple, no carotid bruise. Pulmonary: Clear to auscultation bilaterally   NEUROLOGICAL EXAM:  MENTAL STATUS: Speech:    Speech is normal; fluent and  spontaneous with normal comprehension.  Cognition:    The patient is oriented to person, place, and time;     recent and remote memory intact;     language fluent;     normal attention, concentration,     fund of knowledge.  CRANIAL NERVES: CN II: Visual fields are full to confrontation. Pupils were round equal reactive to light CN III, IV, VI: extraocular movement are normal. No ptosis. CN V: Facial sensation is intact to pinprick in all 3 divisions bilaterally. Corneal responses are intact.  CN VII: Face is symmetric with normal eye closure and smile. CN VIII: Hearing is normal to rubbing fingers CN IX, X: Palate elevates symmetrically. Phonation is normal. CN XI: Head turning and shoulder shrug are intact CN XII: Tongue is midline with normal movements and no atrophy.  MOTOR: There is no pronator drift  of out-stretched arms. Muscle bulk and tone are normal. Muscle strength is normal.   REFLEXES: Reflexes are 2+ and symmetric at the biceps, triceps, knees, and ankles. Plantar responses are flexor.  SENSORY: Light touch, pinprick, position sense, and vibration sense are intact in fingers and toes.  COORDINATION: Rapid alternating movements and fine finger movements are intact. There is no dysmetria on finger-to-nose and heel-knee-shin. There are no abnormal or extraneous movements.   GAIT/STANCE: Posture is normal. Gait is steady with normal steps, base, arm swing, and turning. Heel and toe walking are normal. Tandem gait is normal.  Romberg is absent.   DATA (LABS, IMAGING, TESTING) None for review   ASSESSMENT AND PLAN  81 y.o. year old female  Seizure  2 seizures in the setting of hepatic failure in April 2008, she had liver transplant in August 2008, doing extremely well, no recurrent seizure,  MRI of the brain in 2008 was abnormal, 6 foci consistent with hemosiderin deposition. Four of these are linear and follow sulci and could represent superficial hemosiderosis  that can be seen in cerebral amyloid angiopathy. 2 of the foci are more consistent with microbleeds that could also be seen amyloid related changes. Scattered T2 hyperintense foci in the hemispheres consistent with moderate small vessel ischemic changes related to aging.  EEG was normal  She is able to tolerate lower dose of Keppra 250 twice a day, was no recurrent seizure, continue Keppra at 250 mg twice a day,  Chronic insomnia  Ambien every night as needed  I also educated her about sleep hygiene   Marcial Pacas, M.D. Ph.D.  [facility] [place], [place] [address] Phone: [phone] Fax:      [phone]

## 2015-08-31 NOTE — Telephone Encounter (Signed)
No showed follow up appointment. 

## 2015-11-09 DIAGNOSIS — Z418 Encounter for other procedures for purposes other than remedying health state: Secondary | ICD-10-CM | POA: Diagnosis not present

## 2015-11-09 DIAGNOSIS — Z944 Liver transplant status: Secondary | ICD-10-CM | POA: Diagnosis not present

## 2015-11-15 DIAGNOSIS — Z944 Liver transplant status: Secondary | ICD-10-CM | POA: Diagnosis not present

## 2015-11-15 DIAGNOSIS — Z418 Encounter for other procedures for purposes other than remedying health state: Secondary | ICD-10-CM | POA: Diagnosis not present

## 2015-11-16 ENCOUNTER — Ambulatory Visit (INDEPENDENT_AMBULATORY_CARE_PROVIDER_SITE_OTHER): Payer: Medicare Other | Admitting: Family Medicine

## 2015-11-16 DIAGNOSIS — Z23 Encounter for immunization: Secondary | ICD-10-CM | POA: Diagnosis not present

## 2015-12-19 ENCOUNTER — Telehealth: Payer: Self-pay | Admitting: Family Medicine

## 2015-12-19 DIAGNOSIS — G4700 Insomnia, unspecified: Secondary | ICD-10-CM

## 2015-12-19 MED ORDER — ZOLPIDEM TARTRATE ER 6.25 MG PO TBCR: 6.2500 mg | EXTENDED_RELEASE_TABLET | Freq: Every evening | ORAL | Status: DC | PRN

## 2015-12-19 NOTE — Telephone Encounter (Addendum)
Pt needs zolpidem 6.25 mg #90 w/refills  send [facility]

## 2015-12-19 NOTE — Telephone Encounter (Signed)
Rx printed and faxed to [facility].

## 2016-01-28 DIAGNOSIS — S40021A Contusion of right upper arm, initial encounter: Secondary | ICD-10-CM | POA: Diagnosis not present

## 2016-01-29 ENCOUNTER — Emergency Department (HOSPITAL_COMMUNITY): Payer: Medicare Other

## 2016-01-29 ENCOUNTER — Emergency Department (HOSPITAL_COMMUNITY)
Admission: EM | Admit: 2016-01-29 | Discharge: 2016-01-29 | Disposition: A | Discharge status: Home / Self Care | Payer: Medicare Other | Attending: Emergency Medicine | Admitting: Emergency Medicine

## 2016-01-29 ENCOUNTER — Encounter (HOSPITAL_COMMUNITY): Payer: Self-pay

## 2016-01-29 DIAGNOSIS — X58XXXA Exposure to other specified factors, initial encounter: Secondary | ICD-10-CM | POA: Diagnosis not present

## 2016-01-29 DIAGNOSIS — Y998 Other external cause status: Secondary | ICD-10-CM | POA: Insufficient documentation

## 2016-01-29 DIAGNOSIS — Y9389 Activity, other specified: Secondary | ICD-10-CM | POA: Insufficient documentation

## 2016-01-29 DIAGNOSIS — Z7982 Long term (current) use of aspirin: Secondary | ICD-10-CM | POA: Insufficient documentation

## 2016-01-29 DIAGNOSIS — M7989 Other specified soft tissue disorders: Secondary | ICD-10-CM | POA: Diagnosis not present

## 2016-01-29 DIAGNOSIS — Z79899 Other long term (current) drug therapy: Secondary | ICD-10-CM | POA: Insufficient documentation

## 2016-01-29 DIAGNOSIS — M79601 Pain in right arm: Secondary | ICD-10-CM | POA: Diagnosis not present

## 2016-01-29 DIAGNOSIS — T148XXA Other injury of unspecified body region, initial encounter: Secondary | ICD-10-CM

## 2016-01-29 DIAGNOSIS — Z87891 Personal history of nicotine dependence: Secondary | ICD-10-CM | POA: Insufficient documentation

## 2016-01-29 DIAGNOSIS — S40021A Contusion of right upper arm, initial encounter: Secondary | ICD-10-CM | POA: Diagnosis not present

## 2016-01-29 DIAGNOSIS — S4991XA Unspecified injury of right shoulder and upper arm, initial encounter: Secondary | ICD-10-CM | POA: Diagnosis present

## 2016-01-29 DIAGNOSIS — Y9289 Other specified places as the place of occurrence of the external cause: Secondary | ICD-10-CM | POA: Insufficient documentation

## 2016-01-29 DIAGNOSIS — Z8719 Personal history of other diseases of the digestive system: Secondary | ICD-10-CM | POA: Diagnosis not present

## 2016-01-29 DIAGNOSIS — Z8679 Personal history of other diseases of the circulatory system: Secondary | ICD-10-CM | POA: Insufficient documentation

## 2016-01-29 DIAGNOSIS — M8580 Other specified disorders of bone density and structure, unspecified site: Secondary | ICD-10-CM | POA: Diagnosis not present

## 2016-01-29 LAB — CBC
HCT: 25.3 % — ABNORMAL LOW (ref 36.0–46.0)
Hemoglobin: 8.6 g/dL — ABNORMAL LOW (ref 12.0–15.0)
MCH: 31.9 pg (ref 26.0–34.0)
MCHC: 34 g/dL (ref 30.0–36.0)
MCV: 93.7 fL (ref 78.0–100.0)
PLATELETS: 193 10*3/uL (ref 150–400)
RBC: 2.7 MIL/uL — AB (ref 3.87–5.11)
RDW: 12.8 % (ref 11.5–15.5)
WBC: 5 10*3/uL (ref 4.0–10.5)

## 2016-01-29 LAB — COMPREHENSIVE METABOLIC PANEL
ALK PHOS: 48 U/L (ref 38–126)
ALT: 13 U/L — ABNORMAL LOW (ref 14–54)
AST: 18 U/L (ref 15–41)
Albumin: 3.6 g/dL (ref 3.5–5.0)
Anion gap: 6 (ref 5–15)
BUN: 11 mg/dL (ref 6–20)
CALCIUM: 9 mg/dL (ref 8.9–10.3)
CO2: 26 mmol/L (ref 22–32)
Chloride: 98 mmol/L — ABNORMAL LOW (ref 101–111)
Creatinine, Ser: 1.14 mg/dL — ABNORMAL HIGH (ref 0.44–1.00)
GFR calc non Af Amer: 44 mL/min — ABNORMAL LOW (ref >60)
GFR, EST AFRICAN AMERICAN: 51 mL/min — AB (ref >60)
Glucose, Bld: 120 mg/dL — ABNORMAL HIGH (ref 65–99)
POTASSIUM: 4.7 mmol/L (ref 3.5–5.1)
SODIUM: 130 mmol/L — AB (ref 135–145)
Total Bilirubin: 0.7 mg/dL (ref 0.3–1.2)
Total Protein: 6.1 g/dL — ABNORMAL LOW (ref 6.5–8.1)

## 2016-01-29 LAB — PROTIME-INR
INR: 1.14 (ref 0.00–1.49)
PROTHROMBIN TIME: 14.3 s (ref 11.6–15.2)

## 2016-01-29 NOTE — ED Notes (Signed)
Pt was scrubbing floor yesterday.  Pt noted soon after that her right arm was bruised and swelling.  Arm had been uncomfortable for past week or so but had not had swelling or bruising.  No injury was noted.  She went to urgent care and told she had a contusion but to come in if it gets worse.  Pt has bruising and swelling to rt upper arm and progressing to lower forearm.

## 2016-01-29 NOTE — ED Provider Notes (Signed)
CSN: [id number]     Arrival date & time 01/29/16  1216 History   First MD Initiated Contact with Patient 01/29/16 1353     Chief Complaint  Patient presents with  . hematoma       HPI Patient presents to the emergency department with complaints of bruising and swelling of her right upper extremity.  She states that she was scrubbing the floors vigorously yesterday and noticed bruising and swelling.  She was seen in urgent care placed in a sling and recommended that she come to the ER for further evaluation if the hematoma were to become larger.  She denies weakness of her hand.  She reports no difficulty with motor activity of her right hand.  She reports no swelling of the right hand or of the right axillary region.  No history DVT.  She is not on anticoagulants.  She reports no trauma.  She reports normal extension and flexion at the right elbow   Past Medical History  Diagnosis Date  . Osteoporosis   . Cirrhosis ([place])   . GI bleed     patient denies  . Seizures ([place])   . Heart valve problem     "2 leaking heart valves"   Past Surgical History  Procedure Laterality Date  . Liver transplant  08/08   Family History  Problem Relation Age of Onset  . Heart attack Mother 4   Social History  Substance Use Topics  . Smoking status: Former Research scientist (life sciences)  . Smokeless tobacco: Never Used  . Alcohol Use: No     Comment: h/o alcohol abuse   OB History    No data available     Review of Systems  All other systems reviewed and are negative.     Allergies  Nsaids and Codeine  Home Medications   Prior to Admission medications   Medication Sig Start Date End Date Taking? Authorizing Provider  aspirin EC 81 MG tablet Take 81 mg by mouth daily.   Yes Historical Provider, MD  cholecalciferol (VITAMIN D) 1000 UNITS tablet Take 1,000 Units by mouth daily.     Yes Historical Provider, MD  ibuprofen (ADVIL,MOTRIN) 200 MG tablet Take 400 mg by mouth every 6 (six) hours as needed for  moderate pain.   Yes Historical Provider, MD  levETIRAcetam (KEPPRA) 500 MG tablet Take 500 mg by mouth 2 (two) times daily.   Yes Historical Provider, MD  Multiple Vitamin (MULTIVITAMIN) tablet Take 1 tablet by mouth daily.     Yes Historical Provider, MD  pantoprazole (PROTONIX) 40 MG tablet Take 1 tablet by mouth  daily 06/07/15  Yes Marin Olp, MD  tacrolimus (PROGRAF) 1 MG capsule Take 2 mg by mouth 2 (two) times daily. Take 2 capsules in the morning and 2 capsule a night   Yes Historical Provider, MD  traMADol (ULTRAM) 50 MG tablet Take 25 mg by mouth 2 (two) times daily. 01/28/16  Yes Historical Provider, MD  zolpidem (AMBIEN CR) 6.25 MG CR tablet Take 1 tablet (6.25 mg total) by mouth at bedtime as needed for sleep. 12/19/15 12/17/16 Yes Marin Olp, MD  levETIRAcetam (KEPPRA) 250 MG tablet Take 1 tablet (250 mg total) by mouth 2 (two) times daily. 06/05/15   Marcial Pacas, MD   BP 115/62 mmHg  Pulse 72  Temp(Src) 97.8 F (36.6 C) (Oral)  Resp 18  SpO2 98% Physical Exam  Constitutional: She is oriented to person, place, and time. She appears well-developed and well-nourished.  HENT:  Head: Normocephalic.  Eyes: EOM are normal.  Neck: Normal range of motion.  Pulmonary/Chest: Effort normal.  Abdominal: She exhibits no distension.  Musculoskeletal: Normal range of motion.  Large subcutaneous hematoma of the right medial upper arm extending down to the level of the proximal right medial forearm.  She has normal range of motion of the right shoulder, right elbow, right wrist.  Shows normal right radial and right ulnar pulse.  Normal strength and grip of her right hand.  No pulsatile mass noted in her right upper extremity.  Neurological: She is alert and oriented to person, place, and time.  Psychiatric: She has a normal mood and affect.  Nursing note and vitals reviewed.   ED Course  Procedures  Labs Review Labs Reviewed  CBC - Abnormal; Notable for the following:    RBC 2.70  (*)    Hemoglobin 8.6 (*)    HCT 25.3 (*)    All other components within normal limits  COMPREHENSIVE METABOLIC PANEL - Abnormal; Notable for the following:    Sodium 130 (*)    Chloride 98 (*)    Glucose, Bld 120 (*)    Creatinine, Ser 1.14 (*)    Total Protein 6.1 (*)    ALT 13 (*)    GFR calc non Af Amer 44 (*)    GFR calc Af Amer 51 (*)    All other components within normal limits  PROTIME-INR    HEMOGLOBIN  Date Value Ref Range Status  01/29/2016 8.6* 12.0 - 15.0 g/dL Final  04/29/2014 10.9* 12.0 - 15.0 g/dL Final  03/03/2014 10.8* 12.0 - 15.0 g/dL Final  06/08/2010 10.6* 12.0-15.0 g/dL Final      Imaging Review No results found. I have personally reviewed and evaluated these images and lab results as part of my medical decision-making.   EKG Interpretation None      MDM   Final diagnoses:  Hematoma    This appears to be hematoma that is extending secondary to gravity.  She has normal arterial pulses at this time.  Doubt DVT.  Plain film of the right humerus is obtained and no fractures noted.  Sling for comfort.  She does not appear to be actively bleeding at this time.  Mild drop in her hemoglobin but vital signs are stable.  Discharge home in good condition.    Jola Schmidt, MD 01/29/16 [phone]

## 2016-01-29 NOTE — ED Notes (Signed)
Awake. Verbally responsive. A/O x4. Resp even and unlabored. No audible adventitious breath sounds noted. ABC's intact.  

## 2016-01-29 NOTE — Discharge Instructions (Signed)
Hematoma  A hematoma is a collection of blood under the skin, in an organ, in a body space, in a joint space, or in other tissue. The blood can clot to form a lump that you can see and feel. The lump is often firm and may sometimes become sore and tender. Most hematomas get better in a few days to weeks. However, some hematomas may be serious and require medical care. Hematomas can range in size from very small to very large.  CAUSES   A hematoma can be caused by a blunt or penetrating injury. It can also be caused by spontaneous leakage from a blood vessel under the skin. Spontaneous leakage from a blood vessel is more likely to occur in older people, especially those taking blood thinners. Sometimes, a hematoma can develop after certain medical procedures.  SIGNS AND SYMPTOMS   · A firm lump on the body.  · Possible pain and tenderness in the area.  · Bruising. Blue, dark blue, purple-red, or yellowish skin may appear at the site of the hematoma if the hematoma is close to the surface of the skin.  For hematomas in deeper tissues or body spaces, the signs and symptoms may be subtle. For example, an intra-abdominal hematoma may cause abdominal pain, weakness, fainting, and shortness of breath. An intracranial hematoma may cause a headache or symptoms such as weakness, trouble speaking, or a change in consciousness.  DIAGNOSIS   A hematoma can usually be diagnosed based on your medical history and a physical exam. Imaging tests may be needed if your health care provider suspects a hematoma in deeper tissues or body spaces, such as the abdomen, head, or chest. These tests may include ultrasonography or a CT scan.   TREATMENT   Hematomas usually go away on their own over time. Rarely does the blood need to be drained out of the body. Large hematomas or those that may affect vital organs will sometimes need surgical drainage or monitoring.  HOME CARE INSTRUCTIONS   · Apply ice to the injured area:      Put ice in a  plastic bag.      Place a towel between your skin and the bag.      Leave the ice on for 20 minutes, 2-3 times a day for the first 1 to 2 days.    · After the first 2 days, switch to using warm compresses on the hematoma.    · Elevate the injured area to help decrease pain and swelling. Wrapping the area with an elastic bandage may also be helpful. Compression helps to reduce swelling and promotes shrinking of the hematoma. Make sure the bandage is not wrapped too tight.    · If your hematoma is on a lower extremity and is painful, crutches may be helpful for a couple days.    · Only take over-the-counter or prescription medicines as directed by your health care provider.  SEEK IMMEDIATE MEDICAL CARE IF:   · You have increasing pain, or your pain is not controlled with medicine.    · You have a fever.    · You have worsening swelling or discoloration.    · Your skin over the hematoma breaks or starts bleeding.    · Your hematoma is in your chest or abdomen and you have weakness, shortness of breath, or a change in consciousness.  · Your hematoma is on your scalp (caused by a fall or injury) and you have a worsening headache or a change in alertness or consciousness.  MAKE SURE YOU:   ·   Understand these instructions.  · Will watch your condition.  · Will get help right away if you are not doing well or get worse.     This information is not intended to replace advice given to you by your health care provider. Make sure you discuss any questions you have with your health care provider.     Document Released: 07/03/2004 Document Revised: 07/22/2013 Document Reviewed: 04/29/2013  Elsevier Interactive Patient Education ©2016 [employer]

## 2016-01-29 NOTE — ED Notes (Signed)
Pt noted with rt arm bruised and swelling s/p to wiping floors. (+)PMS, CRT poor, denies injury/trauma, no deformity, no blood thinning medication but she does take Aspirin 81mg , and LROM.

## 2016-01-29 NOTE — ED Notes (Signed)
Patient transported to X-ray and returned to room without distress noted. 

## 2016-02-08 ENCOUNTER — Telehealth: Payer: Self-pay | Admitting: Family Medicine

## 2016-02-08 NOTE — Telephone Encounter (Signed)
May write for tramadol 50mg  BID #60

## 2016-02-08 NOTE — Telephone Encounter (Signed)
See below

## 2016-02-08 NOTE — Telephone Encounter (Signed)
Daughter would like to know if dr hunter can refill pt's  traMADol 50 MG tablet  Pt was prescribed this med when she went to Urgent care in February. Now pt has a torn muscle in her arm, ad needs something for the pain.  [facility] [address]

## 2016-02-09 MED ORDER — TRAMADOL HCL 50 MG PO TABS: 50.0000 mg | ORAL_TABLET | Freq: Two times a day (BID) | ORAL | Status: DC

## 2016-02-09 NOTE — Telephone Encounter (Signed)
[facility] into [facility] 

## 2016-02-23 DIAGNOSIS — Z418 Encounter for other procedures for purposes other than remedying health state: Secondary | ICD-10-CM | POA: Diagnosis not present

## 2016-02-23 DIAGNOSIS — Z48298 Encounter for aftercare following other organ transplant: Secondary | ICD-10-CM | POA: Diagnosis not present

## 2016-03-05 ENCOUNTER — Telehealth: Payer: Self-pay | Admitting: Cardiovascular Disease

## 2016-03-05 DIAGNOSIS — I351 Nonrheumatic aortic (valve) insufficiency: Secondary | ICD-10-CM

## 2016-03-05 NOTE — Telephone Encounter (Signed)
New Message  Pt daughter called, request a call back to discuss sch an ECHO. No orders on file. Please assist

## 2016-03-05 NOTE — Telephone Encounter (Signed)
Ok to order echo for Aortic Insuficiency

## 2016-03-06 NOTE — Telephone Encounter (Signed)
Left message to call back. Ordered echo for patient. Will send message to [facility] to schedule.

## 2016-03-06 NOTE — Telephone Encounter (Signed)
Follow up ° ° ° ° °Returned a call to the nurse °

## 2016-03-06 NOTE — Telephone Encounter (Signed)
Patient's daughter aware someone will be calling to schedule echo for her mother.

## 2016-03-19 ENCOUNTER — Telehealth: Payer: Self-pay

## 2016-03-19 DIAGNOSIS — G4700 Insomnia, unspecified: Secondary | ICD-10-CM

## 2016-03-20 MED ORDER — ZOLPIDEM TARTRATE ER 6.25 MG PO TBCR: 6.2500 mg | EXTENDED_RELEASE_TABLET | Freq: Every evening | ORAL | Status: DC | PRN

## 2016-03-20 NOTE — Telephone Encounter (Signed)
Refill ok? 

## 2016-03-20 NOTE — Telephone Encounter (Signed)
May give #15 and needs office visit- over a year

## 2016-03-21 NOTE — Telephone Encounter (Signed)
Pt has appt scheduled for 5/31 to see Dr. Yong Channel can pt get her refill until she comes in?

## 2016-03-21 NOTE — Telephone Encounter (Signed)
This refill was called in yesterday and  i spoke with the patient and informed her.

## 2016-03-26 ENCOUNTER — Telehealth: Payer: Self-pay | Admitting: Family Medicine

## 2016-03-26 NOTE — Telephone Encounter (Signed)
Full Rx will be provided at her OV next month correct?

## 2016-03-26 NOTE — Telephone Encounter (Signed)
Yes thanks 

## 2016-03-26 NOTE — Telephone Encounter (Signed)
Pt request refill of the following:  zolpidem (AMBIEN CR) 6.25 MG CR tablet  Pt has an appt on 05/02/16 and would like to have the above med call in to the below pharmacy . She req a full rx    Phamacy: [facility]

## 2016-04-02 ENCOUNTER — Telehealth: Payer: Self-pay | Admitting: Family Medicine

## 2016-04-02 ENCOUNTER — Telehealth: Payer: Self-pay | Admitting: *Deleted

## 2016-04-02 NOTE — Telephone Encounter (Signed)
This medication will be fully filled at pt upcoming visit. #15 was supplied for pt.

## 2016-04-02 NOTE — Telephone Encounter (Signed)
Pts daughter is inquiring about pts Rx for zolpidem (ambien) 6.25.  Pharm:  [facility] will be sending another Rx for this medication.

## 2016-04-02 NOTE — Telephone Encounter (Signed)
[facility] Fax [phone] Refill request  zolpidem (AMBIEN CR) 6.25 MG CR tablet

## 2016-04-03 NOTE — Telephone Encounter (Signed)
Spoke with pt daughter and pt did not inform her that [facility] Mais was at [facility]. Pt daughter will pick up meds today.

## 2016-04-05 ENCOUNTER — Telehealth: Payer: Self-pay | Admitting: General Practice

## 2016-04-05 ENCOUNTER — Other Ambulatory Visit: Payer: Self-pay | Admitting: General Practice

## 2016-04-05 DIAGNOSIS — G4700 Insomnia, unspecified: Secondary | ICD-10-CM

## 2016-04-05 MED ORDER — ZOLPIDEM TARTRATE ER 6.25 MG PO TBCR: 6.2500 mg | EXTENDED_RELEASE_TABLET | Freq: Every evening | ORAL | Status: DC | PRN

## 2016-04-05 NOTE — Telephone Encounter (Signed)
Yes thanks 

## 2016-04-05 NOTE — Telephone Encounter (Signed)
Refill sent to pharmacy.   

## 2016-04-25 NOTE — Progress Notes (Signed)
Patient ID: [name], female   DOB: February 06, 1932, 82 y.o.   MRN: [id number]   82 y.o.  patient of Dr Harrington Challenger Referred by Dr [name] for murmur and abnormal echo in 2015. She was seen in 2007 and had normal cath with normla EF and PA pressure 31/6  MmHg At that time had mild AR and mild MR. She is from [place] and retired from [employer] She is very active dancing walking and still drives. She has no chest pain, palpitatins or dyspnea.   Most recent echo reviewed 05/18/15 Study Conclusions  - Left ventricle: The cavity size was normal. Systolic function was  normal. The estimated ejection fraction was in the range of 55%  to 60%. Wall motion was normal; there were no regional wall  motion abnormalities. Features are consistent with a pseudonormal  left ventricular filling pattern, with concomitant abnormal  relaxation and increased filling pressure (grade 2 diastolic  dysfunction). Doppler parameters are consistent with high  ventricular filling pressure. - Aortic valve: Trileaflet; normal thickness, mildly calcified  leaflets. There was moderate regurgitation. - Aorta: Ascending aortic diameter: 38 mm (S). - Ascending aorta: The ascending aorta was mildly dilated. - Mitral valve: Moderately to severely calcified annulus. Mild  diffuse thickening and calcification, with moderate involvement  of chords. The findings are consistent with moderate stenosis.  There was moderate regurgitation. - Pulmonic valve: There was trivial regurgitation. - Pulmonary arteries: PA peak pressure: 31 mm Hg (S).  Multiple comorbidities-  She has a history of advanced alcoholic cirrhosis, portal hypertension, splenomegaly, pelvic varices and GI bleed Had liver transplant done 9 years ago. She has also had shortness of breath attributed to COPD and significant right to left shunt at the pulmonary level related to pulmonary AV fistula.    ROS: Denies fever, malais, weight loss, blurry vision, decreased visual  acuity, cough, sputum, SOB, hemoptysis, pleuritic pain, palpitaitons, heartburn, abdominal pain, melena, lower extremity edema, claudication, or rash.  All other systems reviewed and negative  General: Affect appropriate Healthy:  appears stated age 82: normal Neck supple with no adenopathy JVP normal no bruits no thyromegaly Lungs clear with no wheezing and good diaphragmatic motion Heart:  S1/S2 AR  murmur, no rub, gallop or click PMI normal Abdomen: benighn, BS positve, no tenderness, no AAA no bruit.  No HSM or HJR Distal pulses intact with no bruits No edema Neuro non-focal Skin warm and dry No muscular weakness   Current Outpatient Prescriptions  Medication Sig Dispense Refill  . aspirin EC 81 MG tablet Take 81 mg by mouth daily.    . cholecalciferol (VITAMIN D) 1000 UNITS tablet Take 1,000 Units by mouth daily.      Marland Kitchen ibuprofen (ADVIL,MOTRIN) 200 MG tablet Take 400 mg by mouth every 6 (six) hours as needed for moderate pain.    Marland Kitchen levETIRAcetam (KEPPRA) 250 MG tablet Take 1 tablet (250 mg total) by mouth 2 (two) times daily. 180 tablet 3  . levETIRAcetam (KEPPRA) 500 MG tablet Take 500 mg by mouth 2 (two) times daily.    . Multiple Vitamin (MULTIVITAMIN) tablet Take 1 tablet by mouth daily.      . pantoprazole (PROTONIX) 40 MG tablet Take 1 tablet by mouth  daily 90 tablet 3  . tacrolimus (PROGRAF) 1 MG capsule Take 2 mg by mouth 2 (two) times daily. Take 2 capsules in the morning and 2 capsule a night    . traMADol (ULTRAM) 50 MG tablet Take 1 tablet (50 mg total) by  mouth 2 (two) times daily. 60 tablet 0  . zolpidem (AMBIEN CR) 6.25 MG CR tablet Take 1 tablet (6.25 mg total) by mouth at bedtime as needed for sleep. 15 tablet 0   No current facility-administered medications for this visit.    Allergies  Nsaids and Codeine  Electrocardiogram:  SR rate 70 normal ECG   Assessment and Plan Seizure: Valve Disease: Liver Transplant Pulmonary   Jenkins Rouge   This encounter was created in error - please disregard.

## 2016-04-26 ENCOUNTER — Other Ambulatory Visit (HOSPITAL_COMMUNITY): Payer: Medicare Other

## 2016-04-26 ENCOUNTER — Encounter: Payer: Medicare Other | Admitting: Cardiovascular Disease

## 2016-05-02 ENCOUNTER — Telehealth: Payer: Self-pay | Admitting: Family Medicine

## 2016-05-02 ENCOUNTER — Encounter: Payer: Self-pay | Admitting: Family Medicine

## 2016-05-02 ENCOUNTER — Ambulatory Visit (INDEPENDENT_AMBULATORY_CARE_PROVIDER_SITE_OTHER): Payer: Medicare Other | Admitting: Family Medicine

## 2016-05-02 VITALS — BP 124/62 | HR 75 | Temp 97.7°F | Ht 61.0 in | Wt 108.0 lb

## 2016-05-02 DIAGNOSIS — G4700 Insomnia, unspecified: Secondary | ICD-10-CM

## 2016-05-02 DIAGNOSIS — Z87891 Personal history of nicotine dependence: Secondary | ICD-10-CM

## 2016-05-02 DIAGNOSIS — D485 Neoplasm of uncertain behavior of skin: Secondary | ICD-10-CM

## 2016-05-02 DIAGNOSIS — Z23 Encounter for immunization: Secondary | ICD-10-CM | POA: Diagnosis not present

## 2016-05-02 DIAGNOSIS — G40909 Epilepsy, unspecified, not intractable, without status epilepticus: Secondary | ICD-10-CM

## 2016-05-02 DIAGNOSIS — Z0000 Encounter for general adult medical examination without abnormal findings: Secondary | ICD-10-CM

## 2016-05-02 DIAGNOSIS — M810 Age-related osteoporosis without current pathological fracture: Secondary | ICD-10-CM

## 2016-05-02 DIAGNOSIS — E871 Hypo-osmolality and hyponatremia: Secondary | ICD-10-CM | POA: Diagnosis not present

## 2016-05-02 DIAGNOSIS — K7460 Unspecified cirrhosis of liver: Secondary | ICD-10-CM

## 2016-05-02 DIAGNOSIS — R319 Hematuria, unspecified: Secondary | ICD-10-CM | POA: Diagnosis not present

## 2016-05-02 DIAGNOSIS — Z944 Liver transplant status: Secondary | ICD-10-CM | POA: Diagnosis not present

## 2016-05-02 DIAGNOSIS — K219 Gastro-esophageal reflux disease without esophagitis: Secondary | ICD-10-CM

## 2016-05-02 LAB — POC URINALSYSI DIPSTICK (AUTOMATED)
Glucose, UA: NEGATIVE
Ketones, UA: NEGATIVE
Nitrite, UA: NEGATIVE
Spec Grav, UA: 1.025
UROBILINOGEN UA: 0.2
pH, UA: 5.5

## 2016-05-02 LAB — URINALYSIS, MICROSCOPIC ONLY: RBC / HPF: NONE SEEN (ref 0–?)

## 2016-05-02 LAB — CBC
HEMATOCRIT: 32.7 % — AB (ref 36.0–46.0)
Hemoglobin: 11.1 g/dL — ABNORMAL LOW (ref 12.0–15.0)
MCHC: 33.8 g/dL (ref 30.0–36.0)
MCV: 91.9 fl (ref 78.0–100.0)
Platelets: 197 10*3/uL (ref 150.0–400.0)
RBC: 3.56 Mil/uL — AB (ref 3.87–5.11)
RDW: 12.6 % (ref 11.5–15.5)
WBC: 4.7 10*3/uL (ref 4.0–10.5)

## 2016-05-02 LAB — COMPREHENSIVE METABOLIC PANEL
ALBUMIN: 4 g/dL (ref 3.5–5.2)
ALT: 13 U/L (ref 0–35)
AST: 15 U/L (ref 0–37)
Alkaline Phosphatase: 48 U/L (ref 39–117)
BUN: 5 mg/dL — AB (ref 6–23)
CALCIUM: 9.3 mg/dL (ref 8.4–10.5)
CHLORIDE: 97 meq/L (ref 96–112)
CO2: 29 mEq/L (ref 19–32)
Creatinine, Ser: 0.9 mg/dL (ref 0.40–1.20)
GFR: 63.79 mL/min (ref >60.00)
Glucose, Bld: 87 mg/dL (ref 70–99)
POTASSIUM: 4.5 meq/L (ref 3.5–5.1)
SODIUM: 130 meq/L — AB (ref 135–145)
Total Bilirubin: 0.6 mg/dL (ref 0.2–1.2)
Total Protein: 6.5 g/dL (ref 6.0–8.3)

## 2016-05-02 MED ORDER — ZOLPIDEM TARTRATE ER 6.25 MG PO TBCR: 6.2500 mg | EXTENDED_RELEASE_TABLET | Freq: Every evening | ORAL | Status: DC | PRN

## 2016-05-02 NOTE — Addendum Note (Signed)
Addended by: Elmer Picker on: 05/02/2016 01:12 PM   Modules accepted: Orders

## 2016-05-02 NOTE — Patient Instructions (Addendum)
  [name] , Thank you for taking time to come for your Medicare Wellness Visit. I appreciate your ongoing commitment to your health goals. Please review the following plan we discussed and let me know if I can assist you in the future.   These are the goals we discussed: 1. Prevnar 13 before you leave 2. Labs before you leave 3. Schedule your bone density test at check out desk 4. We will call you within a week about your referral to dermatology. If you do not hear within 2 weeks, give [place] a call.     This is a list of the screening recommended for you and due dates:  Health Maintenance  Topic Date Due  . DEXA scan (bone density measurement)  11/06/1999  . Pneumonia vaccines (2 of 2 - PCV13) 12/03/2004  . Flu Shot  07/03/2016  . Tetanus Vaccine  04/29/2024

## 2016-05-02 NOTE — Addendum Note (Signed)
Addended by: Inocencio Homes on: 05/02/2016 09:08 AM   Modules accepted: Orders

## 2016-05-02 NOTE — Telephone Encounter (Addendum)
Pt is stuck in traffic and will be here as soon as daughter picks them up.

## 2016-05-02 NOTE — Progress Notes (Signed)
Phone: [phone]  Subjective:  Patient presents today for their annual wellness visit.    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 Advised regular exercise.  Diet: encouraged some increased intake Fall Risk: None  Fall Risk  05/02/2016 03/03/2014  Falls in the past year? No No   Cardiac risk factors:  advanced age (older than 1 for men, 31 for women)  Hyperlipidemia none No diabetes.  No hypertension Family History: mom MI age 18   Depression Screen None. PHQ2 0  Depression screen [facility] 2/9 05/02/2016 03/03/2014  Decreased Interest 0 0  Down, Depressed, Hopeless 0 0  PHQ - 2 Score 0 0    Activities of Daily Living Independent ADLs and IADLs (except for finances which daughter manages, previously husband had managed but does not now due to dementia)  Hearing Difficulties: -patient declines  Cognitive Testing No reported trouble.   Normal 3 word recall  List the Names of Other Physician/Practitioners you currently use: -Dr. Krista Blue neurology -Dr. Donnetta Simpers hepatology/liver transplant center. They also advised dermatology follow up for left arm lesion - refer today  Immunization History  Administered Date(s) Administered  . Influenza Split 11/07/2011, 11/05/2012  . Influenza Whole 09/14/2008, 09/19/2009, 10/12/2010  . Influenza, High Dose Seasonal PF 09/23/2013  . Influenza,inj,Quad PF,36+ Mos 10/11/2014, 11/16/2015  . Pneumococcal Polysaccharide-23 12/04/2003  . Tdap 04/29/2014   Required Immunizations needed today : Prevnar 13  Screening tests- up to date Health Maintenance Due  Topic Date Due  . DEXA SCAN - ordered today 11/06/1999  . PNA vac Low Risk Adult (2 of 2 - PCV13)- done today 12/03/2004    ROS- No pertinent positives discovered in course of AWV ROS- no ruq pain, no ches tpain, no  shortness of breath. No headaches or blurry vision.   The following were reviewed and entered/updated in epic: Past Medical History  Diagnosis Date  . Osteoporosis   . Cirrhosis ([place])   . GI bleed     patient denies  . Seizures ([place])   . Heart valve problem     "2 leaking heart valves"   Patient Active Problem List   Diagnosis Date Noted  . Seizure disorder ([place]) 09/21/2013    Priority: High  . Liver replaced by transplant ([place]) 06/08/2010    Priority: High  . Hepatic cirrhosis ([place]) 06/09/2007    Priority: High  . Hyponatremia 05/17/2014    Priority: Medium  . Aortic insufficiency 12/03/2012    Priority: Medium  . GERD (gastroesophageal reflux disease) 12/13/2014    Priority: Low  . Dizziness 05/17/2014    Priority: Low  . Insomnia 06/08/2010    Priority: Low  . Osteoporosis 06/09/2007    Priority: Low   Past Surgical History  Procedure Laterality Date  . Liver transplant  08/08    Family History  Problem Relation Age of Onset  . Heart attack Mother 74    Medications- reviewed and updated Current Outpatient Prescriptions  Medication Sig Dispense Refill  . aspirin EC 81 MG tablet Take 81 mg by mouth daily.    . cholecalciferol (VITAMIN D) 1000 UNITS tablet Take 1,000 Units by mouth daily.      Marland Kitchen ibuprofen (ADVIL,MOTRIN) 200 MG tablet Take 400 mg by mouth every 6 (six) hours as needed for moderate pain.    Marland Kitchen levETIRAcetam (KEPPRA) 250 MG tablet Take 1 tablet (250  mg total) by mouth 2 (two) times daily. 180 tablet 3  . levETIRAcetam (KEPPRA) 500 MG tablet Take 500 mg by mouth 2 (two) times daily.    . Multiple Vitamin (MULTIVITAMIN) tablet Take 1 tablet by mouth daily.      . pantoprazole (PROTONIX) 40 MG tablet Take 1 tablet by mouth  daily 90 tablet 3  . tacrolimus (PROGRAF) 1 MG capsule Take 2 mg by mouth 2 (two) times daily. Take 2 capsules in the morning and 2 capsule a night    . zolpidem (AMBIEN CR) 6.25 MG CR tablet Take 1 tablet (6.25 mg total) by mouth  at bedtime as needed for sleep. 15 tablet 0   No current facility-administered medications for this visit.    Allergies-reviewed and updated Allergies  Allergen Reactions  . Nsaids Other (See Comments)    Kidney transplant precautions   . Codeine Rash    Social History   Social History  . Marital Status: Married    Spouse Name: [name]   . Number of Children: 3  . Years of Education: 12th   Occupational History  . Retired    Social History Main Topics  . Smoking status: Former Research scientist (life sciences)  . Smokeless tobacco: Never Used  . Alcohol Use: No     Comment: h/o alcohol abuse  . Drug Use: No  . Sexual Activity: Not Asked   Other Topics Concern  . None   Social History Narrative   Patient lives at home with husband [name].    Daughter lives with patient and works from home.       Patient has 3 children. 4 grandchildren. No greatgrandchildren.    Patient has a high school education level.    Patient is retired from [employer], cared for her children      Hobbies: dancing ([place] singing group)-fewer and fewer get togethers    Objective: BP 124/62 mmHg  Pulse 75  Temp(Src) 97.7 F (36.5 C) (Oral)  Ht 5\' 1"  (1.549 m)  Wt 108 lb (48.988 kg)  BMI 20.42 kg/m2  SpO2 95% Gen: NAD, resting comfortably HEENT: Mucous membranes are moist. Oropharynx normal Neck: no thyromegaly CV: RRR. Diastolic murmur unchanged (yearly echo with cards). no rubs or gallops Lungs: CTAB no crackles, wheeze, rhonchi Abdomen: soft/nontender/nondistended/normal bowel sounds. No rebound or guarding.  Ext: no edema right, trace on left (chronic issue- more varicosities on left) Skin: warm, dry, on left forearm 1 x 1.5 cm lesion  Neuro: grossly normal, moves all extremities, PERRLA  Assessment/Plan:  AWV completed- discussed recommended screenings anddocumented any personalized health advice and referrals for preventive counseling. See AVS as well which was given to patient.   Status of chronic  or acute concerns   Hepatic cirrhosis s/p liver transplant from prior alcohol abuse (transplant age 89). She is on tacrolimus. Sees [facility] each June and gets labs every 3 months.   Seizure disorder- long term keppra use without seizure in several years. Follows with Dr. Krista Blue.   Hyponatremia- has been persistent issue but not worsening  GERD- stable on protonix, history of ulcer so remains on  Insomnia- does well on ambien 6.25mg  CR- still only sleeps 4-5 hours and does not sleep much at all without it. Discussed risks and trial off-  Osteoporosis- diagnosis unclear but in problem list- will update bone density to get a true measure  No history of hyperlipidemia to be able to repeat lipid panel Lab Results  Component Value Date   CHOL 164 03/03/2014  HDL 69.10 03/03/2014   LDLCALC 76 03/03/2014   TRIG 97.0 03/03/2014   CHOLHDL 2 03/03/2014   Neoplasm of uncertain behavior of skin  S: on left forearm- was told to see dermatology nearly a year ago by hepatology given she is on immunosuppression. Fortunately no major changes since that time.  A/P: - Plan: Ambulatory referral to Dermatology  Return in about 1 year (around 05/02/2017) for annual wellness visit. Return precautions advised.   Orders Placed This Encounter  Procedures  . DG Bone Density    Standing Status: Future     Number of Occurrences:      Standing Expiration Date: 07/02/2017    Order Specific Question:  Reason for Exam (SYMPTOM  OR DIAGNOSIS REQUIRED)    Answer:  osteoporosis noted in problem list.    Order Specific Question:  Preferred imaging location?    Answer:  [facility]  . CBC    [employer]  . Comprehensive metabolic panel    [facility]  . POCT Urinalysis Dipstick (Automated)   Sent to [facility] ordered this encounter  Medications  . zolpidem (AMBIEN CR) 6.25 MG CR tablet    Sig: Take 1 tablet (6.25 mg total) by mouth at bedtime as needed for sleep.    Dispense:  90 tablet    Refill:  1     Garret Reddish, MD

## 2016-05-02 NOTE — Progress Notes (Signed)
Pre visit review using our clinic review tool, if applicable. No additional management support is needed unless otherwise documented below in the visit note. 

## 2016-05-11 ENCOUNTER — Other Ambulatory Visit (HOSPITAL_COMMUNITY): Payer: Medicare Other

## 2016-05-11 ENCOUNTER — Ambulatory Visit: Payer: Medicare Other | Admitting: Cardiovascular Disease

## 2016-05-21 DIAGNOSIS — Z298 Encounter for other specified prophylactic measures: Secondary | ICD-10-CM | POA: Diagnosis not present

## 2016-05-21 DIAGNOSIS — Z944 Liver transplant status: Secondary | ICD-10-CM | POA: Diagnosis not present

## 2016-05-28 ENCOUNTER — Ambulatory Visit (INDEPENDENT_AMBULATORY_CARE_PROVIDER_SITE_OTHER)
Admission: RE | Admit: 2016-05-28 | Discharge: 2016-05-28 | Disposition: A | Discharge status: Home / Self Care | Payer: Medicare Other | Source: Ambulatory Visit | Attending: Family Medicine | Admitting: Family Medicine

## 2016-05-28 DIAGNOSIS — M810 Age-related osteoporosis without current pathological fracture: Secondary | ICD-10-CM | POA: Diagnosis not present

## 2016-06-06 ENCOUNTER — Encounter: Payer: Self-pay | Admitting: Family Medicine

## 2016-06-07 ENCOUNTER — Other Ambulatory Visit: Payer: Self-pay | Admitting: Family Medicine

## 2016-06-07 MED ORDER — ALENDRONATE SODIUM 70 MG PO TABS: 70.0000 mg | ORAL_TABLET | ORAL | Status: DC

## 2016-06-12 ENCOUNTER — Other Ambulatory Visit: Payer: Self-pay | Admitting: Family Medicine

## 2016-06-12 DIAGNOSIS — C44619 Basal cell carcinoma of skin of left upper limb, including shoulder: Secondary | ICD-10-CM | POA: Diagnosis not present

## 2016-06-12 DIAGNOSIS — D485 Neoplasm of uncertain behavior of skin: Secondary | ICD-10-CM | POA: Diagnosis not present

## 2016-06-12 DIAGNOSIS — C44709 Unspecified malignant neoplasm of skin of left lower limb, including hip: Secondary | ICD-10-CM | POA: Diagnosis not present

## 2016-06-18 ENCOUNTER — Encounter: Payer: Self-pay | Admitting: Physician Assistant

## 2016-06-18 ENCOUNTER — Ambulatory Visit (INDEPENDENT_AMBULATORY_CARE_PROVIDER_SITE_OTHER): Payer: Medicare Other | Admitting: Physician Assistant

## 2016-06-18 ENCOUNTER — Ambulatory Visit (HOSPITAL_COMMUNITY): Payer: Medicare Other | Attending: Cardiology

## 2016-06-18 ENCOUNTER — Other Ambulatory Visit: Payer: Self-pay

## 2016-06-18 VITALS — BP 130/48 | HR 62 | Ht 61.0 in | Wt 110.4 lb

## 2016-06-18 DIAGNOSIS — Z87891 Personal history of nicotine dependence: Secondary | ICD-10-CM | POA: Diagnosis not present

## 2016-06-18 DIAGNOSIS — I517 Cardiomegaly: Secondary | ICD-10-CM | POA: Diagnosis not present

## 2016-06-18 DIAGNOSIS — I351 Nonrheumatic aortic (valve) insufficiency: Secondary | ICD-10-CM

## 2016-06-18 DIAGNOSIS — I340 Nonrheumatic mitral (valve) insufficiency: Secondary | ICD-10-CM | POA: Insufficient documentation

## 2016-06-18 DIAGNOSIS — I371 Nonrheumatic pulmonary valve insufficiency: Secondary | ICD-10-CM | POA: Diagnosis not present

## 2016-06-18 DIAGNOSIS — I080 Rheumatic disorders of both mitral and aortic valves: Secondary | ICD-10-CM | POA: Diagnosis not present

## 2016-06-18 NOTE — Patient Instructions (Addendum)
Medication Instructions:  No changes. See your medication list.  Labwork: None  Testing/Procedures: None  Follow-Up: Dr. Jenkins Rouge in 1 year.  Any Other Special Instructions Will Be Listed Below (If Applicable). If you need a refill on your cardiac medications before your next appointment, please call your pharmacy.

## 2016-06-18 NOTE — Progress Notes (Signed)
Cardiology Office Note:    Date:  06/18/2016   ID:  [name], DOB September 23, 1932, MRN [id number]  PCP:  Gina Reddish, MD  Cardiologist:  Dr. Jenkins Rouge   Electrophysiologist:  N/a Hepatologist/Live Transplant at [facility]: Dr. Enis Gash Neurologist: Dr. Krista Blue  Referring MD: Gina Olp, MD   Chief Complaint  Patient presents with  . Follow-up    Valvular Heart Disease    History of Present Illness:    [name] is a 82 y.o. female with a hx of valvular heart disease with mod AI and mod MR, hepatic cirrhosis s/p liver transplant, seizure d/o.  Last seen in our clinic by Gina Barrios, PA-C in 6/15.    She returns for FU.  Here with her daughter.  She had her FU echo earlier today.  She has no complaints.  She is the primary caregiver for her husband who has dementia. They moved into an apartment this past year.  The patient denies chest pain, shortness of breath, syncope, orthopnea, PND or significant pedal edema.   Past Medical History  Diagnosis Date  . Osteoporosis   . Cirrhosis [facility])     s/p Liver Transplant - follow at [facility]  . History of GI bleed     patient denies  . Seizure disorder ([place])     followed by Neuro  . Mitral valve insufficiency and aortic valve insufficiency     a. Echo 3/14:  Mild LVH, EF 55-65%, Gr 1 DD, moderate AI, MAC, mild mitral stenosis, mild to mod MR, mod LAE, PASP 34 mmHg //  b. Echo 6/15: Mild LVH, EF 55-60%, Gr 2 DD, mode AI, MAC, trivial mitral stenosis (mean gradient 5 mmHg), mod MR, mod LAE, PASP 30 mmHg  //  c. Echo 6/16: EF 55-60%, no RWMA, Gr 2 DD, mod AI, mildly dilated Asc aorta (38 mm), MAC, mod MS, moderate MR, PASP 31 mmHg   . History of cardiac catheterization     a. LHC 06/2006: EF 65-70%, pLAD 20%    Past Surgical History  Procedure Laterality Date  . Liver transplant  08/08    Current Medications: Outpatient Prescriptions Prior to Visit  Medication Sig Dispense Refill  . alendronate (FOSAMAX) 70 MG tablet Take  1 tablet (70 mg total) by mouth every 7 (seven) days. Take with a full glass of water on an empty stomach. 4 tablet 11  . aspirin EC 81 MG tablet Take 81 mg by mouth daily.    . cholecalciferol (VITAMIN D) 1000 UNITS tablet Take 1,000 Units by mouth daily.      Marland Kitchen ibuprofen (ADVIL,MOTRIN) 200 MG tablet Take 400 mg by mouth every 6 (six) hours as needed for moderate pain.    Marland Kitchen levETIRAcetam (KEPPRA) 250 MG tablet Take 1 tablet (250 mg total) by mouth 2 (two) times daily. 180 tablet 3  . Multiple Vitamin (MULTIVITAMIN) tablet Take 1 tablet by mouth daily.      . pantoprazole (PROTONIX) 40 MG tablet Take 1 tablet by mouth  daily 90 tablet 3  . tacrolimus (PROGRAF) 1 MG capsule Take 2 mg by mouth 2 (two) times daily. Take 2 capsules in the morning and 2 capsule a night    . zolpidem (AMBIEN CR) 6.25 MG CR tablet Take 1 tablet (6.25 mg total) by mouth at bedtime as needed for sleep. 90 tablet 1  . levETIRAcetam (KEPPRA) 500 MG tablet Take 500 mg by mouth 2 (two) times daily. Reported on 06/18/2016  No facility-administered medications prior to visit.      Allergies:   Nsaids and Codeine   Social History   Social History  . Marital Status: Married    Spouse Name: [name]   . Number of Children: 3  . Years of Education: 12th   Occupational History  . Retired    Social History Main Topics  . Smoking status: Former Research scientist (life sciences)  . Smokeless tobacco: Never Used  . Alcohol Use: No     Comment: h/o alcohol abuse  . Drug Use: No  . Sexual Activity: Not Asked   Other Topics Concern  . None   Social History Narrative   Patient lives at home with husband [name].    Daughter lives with patient and works from home.       Patient has 3 children. 4 grandchildren. No greatgrandchildren.    Patient has a high school education level.    Patient is retired from [employer], cared for her children      Hobbies: dancing ([employer] singing group)-fewer and fewer get togethers     Family History:  The  patient's family history includes Heart attack (age of onset: 18) in her mother.   ROS:   Please see the history of present illness.    Review of Systems  Hematologic/Lymphatic: Bruises/bleeds easily.   All other systems reviewed and are negative.   Physical Exam:    VS:  BP 130/48 mmHg  Pulse 62  Ht 5\' 1"  (1.549 m)  Wt 110 lb 6.4 oz (50.077 kg)  BMI 20.87 kg/m2    Wt Readings from Last 3 Encounters:  06/18/16 110 lb 6.4 oz (50.077 kg)  05/02/16 108 lb (48.988 kg)  08/31/15 117 lb (53.071 kg)     Physical Exam  Constitutional: She is oriented to person, place, and time. She appears well-developed and well-nourished. No distress.  HENT:  Head: Normocephalic and atraumatic.  Neck: No JVD present.  Cardiovascular: Regular rhythm, S1 normal and S2 normal.   Murmur heard.  Low-pitched diastolic murmur is present with a grade of 2/6  3rd LICS Pulmonary/Chest: Effort normal. She has no wheezes. She has no rales.  Abdominal: Soft. There is no tenderness.  Musculoskeletal: She exhibits no edema.  Neurological: She is alert and oriented to person, place, and time.  Skin: Skin is warm and dry.  Psychiatric: She has a normal mood and affect.    Studies/Labs Reviewed:    EKG:  EKG is  ordered today.  The ekg ordered today demonstrates NSR, HR 61, normal axis, QTc 420 ms, no sig change since prior tracing  Recent Labs: 05/02/2016: ALT 13; BUN 5*; Creatinine, Ser 0.90; Hemoglobin 11.1*; Platelets 197.0; Potassium 4.5; Sodium 130*   Recent Lipid Panel    Component Value Date/Time   CHOL 164 03/03/2014 0907   TRIG 97.0 03/03/2014 0907   HDL 69.10 03/03/2014 0907   CHOLHDL 2 03/03/2014 0907   VLDL 19.4 03/03/2014 0907   LDLCALC 76 03/03/2014 0907    Additional studies/ records that were reviewed today include:   Echo 6/16 EF 55-60%, no RWMA, Gr 2 DD, mod AI, mildly dilated Asc aorta (38 mm), MAC, mod MS, moderate MR, PASP 31 mmHg  Echo 6/15 Mild LVH, EF 55-60%, grade 2  diastolic dysfunction, moderate AI, MAC, trivial mitral stenosis (mean gradient 5 mmHg), moderate MR, moderate LAE, PASP 30 mmHg  Echo 3/14 Mild LVH, EF [id number], grade 1 diastolic dysfunction, moderate AI, MAC, mild mitral stenosis, mild to moderate  MR, moderate LAE, PASP 34 mmHg  LHC 7/07 EF 65-70% LM normal Proximal LAD irregularity 20%  ASSESSMENT:    1. Mitral valve insufficiency and aortic valve insufficiency    PLAN:    In order of problems listed above:  1. Valvular heart disease - She is stable from a symptomatic standpoint.  FU echo done today.  Results are pending. Symptoms do not suggest significant worsening. FU with Dr. Johnsie Cancel in 1 year.    Medication Adjustments/Labs and Tests Ordered: Current medicines are reviewed at length with the patient today.  Concerns regarding medicines are outlined above.  Medication changes, Labs and Tests ordered today are outlined in the Patient Instructions noted below. Patient Instructions  Medication Instructions:  No changes. See your medication list.  Labwork: None  Testing/Procedures: None  Follow-Up: Dr. Jenkins Rouge in 1 year.  Any Other Special Instructions Will Be Listed Below (If Applicable). If you need a refill on your cardiac medications before your next appointment, please call your pharmacy.    Signed, Richardson Dopp, PA-C  06/18/2016 12:24 PM    [facility] [place], [place], [place]  [address] Phone: [phone]; Fax: [phone]

## 2016-07-02 ENCOUNTER — Encounter: Payer: Self-pay | Admitting: Family Medicine

## 2016-07-03 ENCOUNTER — Other Ambulatory Visit: Payer: Self-pay

## 2016-07-03 MED ORDER — ALENDRONATE SODIUM 70 MG PO TABS: 70.0000 mg | ORAL_TABLET | ORAL | 11 refills | Status: DC

## 2016-07-05 ENCOUNTER — Encounter: Payer: Self-pay | Admitting: Family Medicine

## 2016-08-07 DIAGNOSIS — C44619 Basal cell carcinoma of skin of left upper limb, including shoulder: Secondary | ICD-10-CM | POA: Diagnosis not present

## 2016-08-09 DIAGNOSIS — C44719 Basal cell carcinoma of skin of left lower limb, including hip: Secondary | ICD-10-CM | POA: Diagnosis not present

## 2016-08-15 DIAGNOSIS — Z48298 Encounter for aftercare following other organ transplant: Secondary | ICD-10-CM | POA: Diagnosis not present

## 2016-08-15 DIAGNOSIS — Z418 Encounter for other procedures for purposes other than remedying health state: Secondary | ICD-10-CM | POA: Diagnosis not present

## 2016-09-25 ENCOUNTER — Encounter: Payer: Self-pay | Admitting: Family Medicine

## 2016-10-08 ENCOUNTER — Other Ambulatory Visit: Payer: Self-pay | Admitting: Neurology

## 2016-10-17 ENCOUNTER — Other Ambulatory Visit: Payer: Self-pay

## 2016-10-17 MED ORDER — ZOLPIDEM TARTRATE ER 6.25 MG PO TBCR: 6.2500 mg | EXTENDED_RELEASE_TABLET | Freq: Every evening | ORAL | 1 refills | Status: DC | PRN

## 2016-11-15 DIAGNOSIS — Z418 Encounter for other procedures for purposes other than remedying health state: Secondary | ICD-10-CM | POA: Diagnosis not present

## 2016-11-15 DIAGNOSIS — Z48298 Encounter for aftercare following other organ transplant: Secondary | ICD-10-CM | POA: Diagnosis not present

## 2016-11-19 ENCOUNTER — Ambulatory Visit (INDEPENDENT_AMBULATORY_CARE_PROVIDER_SITE_OTHER): Payer: Medicare Other

## 2016-11-19 DIAGNOSIS — Z23 Encounter for immunization: Secondary | ICD-10-CM

## 2016-12-01 ENCOUNTER — Telehealth: Payer: Self-pay | Admitting: Neurology

## 2016-12-05 NOTE — Telephone Encounter (Signed)
Returned call to [facility] - requested medication sent electronically this morning.  They have the rx and will fill for the patient.

## 2016-12-05 NOTE — Telephone Encounter (Signed)
Called patient and ask her to schedule an appt - she will need to check with her daughter and call back.  90-day x 0 sent to pharmacy.

## 2016-12-05 NOTE — Telephone Encounter (Signed)
Amy with [facility] is calling to get a verbal for medication levETIRAcetam (KEPPRA) 250 MG tablet [id number] for the patient.

## 2017-02-08 DIAGNOSIS — Z418 Encounter for other procedures for purposes other than remedying health state: Secondary | ICD-10-CM | POA: Diagnosis not present

## 2017-02-08 DIAGNOSIS — Z48298 Encounter for aftercare following other organ transplant: Secondary | ICD-10-CM | POA: Diagnosis not present

## 2017-03-05 ENCOUNTER — Other Ambulatory Visit: Payer: Self-pay

## 2017-03-05 ENCOUNTER — Ambulatory Visit (INDEPENDENT_AMBULATORY_CARE_PROVIDER_SITE_OTHER): Payer: Medicare Other | Admitting: Neurology

## 2017-03-05 ENCOUNTER — Encounter: Payer: Self-pay | Admitting: Neurology

## 2017-03-05 ENCOUNTER — Ambulatory Visit: Payer: Medicare Other | Admitting: Neurology

## 2017-03-05 VITALS — BP 118/60 | HR 64 | Ht 61.0 in | Wt 116.0 lb

## 2017-03-05 DIAGNOSIS — G40909 Epilepsy, unspecified, not intractable, without status epilepticus: Secondary | ICD-10-CM | POA: Diagnosis not present

## 2017-03-05 MED ORDER — LEVETIRACETAM 250 MG PO TABS: 250.0000 mg | ORAL_TABLET | Freq: Two times a day (BID) | ORAL | 4 refills | Status: DC

## 2017-03-05 MED ORDER — ZOLPIDEM TARTRATE ER 6.25 MG PO TBCR: 6.2500 mg | EXTENDED_RELEASE_TABLET | Freq: Every evening | ORAL | 1 refills | Status: DC | PRN

## 2017-03-05 NOTE — Progress Notes (Signed)
Chief Complaint  Patient presents with  . Seizures    She is here with her daughter, Lyndel Pleasure (last seen 08/31/15).  Says she is doing well on Keppra 269m, one tablet BID.  No seizure acitivity reported.     [facility]  PATIENT: Gina PinchDOB: 81Oct 28, 1933  REASON FOR VISIT: Followup for isolated seizure event  HISTORY OF PRESENT ILLNESS: [name] 83year old white female returns for followup of seizure, her primary care physician is Dr. SRushie Chestnut She is accompanied by her daughter KLeretha Dykes She was last seen by CHoyle Sauerin Oct 2014, she was a patient of Dr. HGaynell Facein the past.  She has a history of advanced alcoholic cirrhosis, portal hypertension, splenomegaly, pelvic varices and GI bleed. She has also had shortness of breath attributed to COPD and significant right to left shunt at the pulmonary level related to pulmonary AV fistula. She was hospitalized and had code stroke, but the patient actually had 2 seizures while she was at hospital for dehydration in April 2008, before liver transplant. MRI brain in April 2008: Three areas of blood breakdown products. Two of these areas are linear and do not have significant surrounding vasogenic edema (left parietal-frontal and right parietal-temporal lesions); however, the left temporal lobe abnormality is rounded in configuration with surrounding white matter type changes suggestive of mild vasogenic edema and demonstrates minimal enhancement. The exact etiology of these findings is indeterminate and may be related to underlying vascular malformation, congophilic angiopathy, hemorrhagic ischemic type changes, or result of thrombosed cortical vessel with tumor felt to be a less likely consideration. Hepatocellular changes best seen on precontrast T1 imaging involving the lenticular nucleus and cerebral peduncles.  Atrophy and mild to moderate nonspecific white matter type changes. No acute thrombotic infarct. MR  angiography was unremarkable. She then [facility]  and has done extremely well. EEG 08/17/2008 was normal.   She is currently on levetiracetam 500 twice daily. She has no recurrent seizure, She has had no further seizure events, she lives at home with her family, highly function, still driving, no significant memory trouble  UPDATE March 22nd 2016: We have reviewed MRI of the brain 6 foci consistent with hemosiderin deposition. Four of these are linear and follow sulci and could represent superficial hemosiderosis that can be seen in cerebral amyloid angiopathy. 2 of the foci are more consistent with microbleeds that could also be seen amyloid related changes. Scattered T2 hyperintense foci in the hemispheres consistent with moderate small vessel ischemic changes related to aging.  EEG was normal  Both patient, and her daughter are hesitate to stop and to left it medications reviewed the potential side effect, including reported long term bone density loss  Update August 31 2015: She is with her daughter at today's clinical visit, she has no recurrent seizure, able to tolerating lower dose of Keppra 250 mg twice a day, She complains of chronic insomnia, taking Ambien as needed  UPDATE March 05 2017: She is doing very well, is the main caregiver of her husband who suffered severe dementia, she is taking Keppra 250 mg twice a day, no significant side effect noticed.  REVIEW OF SYSTEMS: Full 14 system review of systems performed and notable only for, as above   ALLERGIES: Allergies  Allergen Reactions  . Nsaids Other (See Comments)    Kidney transplant precautions   . Codeine Rash    HOME MEDICATIONS: Outpatient Medications Prior to Visit  Medication Sig  Dispense Refill  . alendronate (FOSAMAX) 70 MG tablet Take 1 tablet (70 mg total) by mouth every 7 (seven) days. Take with a full glass of water on an empty stomach. 4 tablet 11  . aspirin EC 81 MG  tablet Take 81 mg by mouth daily.    . Calcium Carbonate-Vit D-Min (CALCIUM 1200 PO) Take 1 tablet by mouth daily.    . cholecalciferol (VITAMIN D) 1000 UNITS tablet Take 1,000 Units by mouth daily.      Marland Kitchen ibuprofen (ADVIL,MOTRIN) 200 MG tablet Take 400 mg by mouth every 6 (six) hours as needed for moderate pain.    Marland Kitchen levETIRAcetam (KEPPRA) 250 MG tablet TAKE 1 TABLET BY MOUTH 2  TIMES DAILY. 180 tablet 0  . Multiple Vitamin (MULTIVITAMIN) tablet Take 1 tablet by mouth daily.      . pantoprazole (PROTONIX) 40 MG tablet Take 1 tablet by mouth  daily 90 tablet 3  . tacrolimus (PROGRAF) 1 MG capsule Take 2 mg by mouth 2 (two) times daily. Take 2 capsules in the morning and 2 capsule a night    . zolpidem (AMBIEN CR) 6.25 MG CR tablet Take 1 tablet (6.25 mg total) by mouth at bedtime as needed for sleep. 90 tablet 1   No facility-administered medications prior to visit.     PAST MEDICAL HISTORY: Past Medical History:  Diagnosis Date  . Cirrhosis [facility])    s/p Liver Transplant - follow at [facility]  . History of cardiac catheterization    a. LHC 06/2006: EF 65-70%, pLAD 20%  . History of GI bleed    patient denies  . Mitral valve insufficiency and aortic valve insufficiency    a. Echo 3/14:  Mild LVH, EF 55-65%, Gr 1 DD, moderate AI, MAC, mild mitral stenosis, mild to mod MR, mod LAE, PASP 34 mmHg //  b. Echo 6/15: Mild LVH, EF 55-60%, Gr 2 DD, mode AI, MAC, trivial mitral stenosis (mean gradient 5 mmHg), mod MR, mod LAE, PASP 30 mmHg  //  c. Echo 6/16: EF 55-60%, no RWMA, Gr 2 DD, mod AI, mildly dilated Asc aorta (38 mm), MAC, mod MS, moderate MR, PASP 31 mmHg   . Osteoporosis   . Seizure disorder ([place])    followed by Neuro    PAST SURGICAL HISTORY: Past Surgical History:  Procedure Laterality Date  . LIVER TRANSPLANT  08/08    FAMILY HISTORY: Family History  Problem Relation Age of Onset  . Heart attack Mother 68    SOCIAL HISTORY: History   Social History  . Marital Status:  Married    Spouse Name: [name]     Number of Children: 3  . Years of Education: 12th   Occupational History  . Retired    Social History Main Topics  . Smoking status: Former Research scientist (life sciences)  . Smokeless tobacco: Never Used  . Alcohol Use: No     Comment: h/o alcohol abuse  . Drug Use: No  . Sexual Activity: Not on file   Other Topics Concern  . Not on file   Social History Narrative   Patient lives at home with husband [name].    Patient has 3 children.    Patient has a high school education level.    Patient is retired.      PHYSICAL EXAM  Vitals:   03/05/17 1200  BP: 118/60  Pulse: 64  Weight: 116 lb (52.6 kg)  Height: _0  (1.549 m)   Body mass index is 21.92 kg/m.  PHYSICAL EXAMNIATION:  Gen: NAD, conversant, well nourised, obese, well groomed                     Cardiovascular: Regular rate rhythm, no peripheral edema, warm, nontender. Eyes: Conjunctivae clear without exudates or hemorrhage Neck: Supple, no carotid bruise. Pulmonary: Clear to auscultation bilaterally   NEUROLOGICAL EXAM:  MENTAL STATUS: Speech:    Speech is normal; fluent and spontaneous with normal comprehension.  Cognition:    The patient is oriented to person, place, and time;     recent and remote memory intact;     language fluent;     normal attention, concentration,     fund of knowledge.  CRANIAL NERVES: CN II: Visual fields are full to confrontation. Pupils were round equal reactive to light CN III, IV, VI: extraocular movement are normal. No ptosis. CN V: Facial sensation is intact to pinprick in all 3 divisions bilaterally. Corneal responses are intact.  CN VII: Face is symmetric with normal eye closure and smile. CN VIII: Hearing is normal to rubbing fingers CN IX, X: Palate elevates symmetrically. Phonation is normal. CN XI: Head turning and shoulder shrug are intact CN XII: Tongue is midline with normal movements and no atrophy.  MOTOR: There is no pronator drift of  out-stretched arms. Muscle bulk and tone are normal. Muscle strength is normal.   REFLEXES: Reflexes are 2+ and symmetric at the biceps, triceps, knees, and ankles. Plantar responses are flexor.  SENSORY: Light touch, pinprick, position sense, and vibration sense are intact in fingers and toes.  COORDINATION: Rapid alternating movements and fine finger movements are intact. There is no dysmetria on finger-to-nose and heel-knee-shin. There are no abnormal or extraneous movements.   GAIT/STANCE: Posture is normal. Gait is steady with normal steps, base, arm swing, and turning. Heel and toe walking are normal. Tandem gait is normal.  Romberg is absent.   DATA (LABS, IMAGING, TESTING) None for review   ASSESSMENT AND PLAN  83 y.o. year old female  Seizure  2 seizures in the setting of hepatic failure in April 2008, she had liver transplant in August 2008, doing extremely well, no recurrent seizure,  MRI of the brain in 2008 was abnormal, 6 foci consistent with hemosiderin deposition. Four of these are linear and follow sulci and could represent superficial hemosiderosis that can be seen in cerebral amyloid angiopathy. 2 of the foci are more consistent with microbleeds that could also be seen amyloid related changes. Scattered T2 hyperintense foci in the hemispheres consistent with moderate small vessel ischemic changes related to aging.  EEG was normal  She is able to tolerate lower dose of Keppra 250 twice a day, was no recurrent seizure, continue Keppra at 250 mg twice a day,    Marcial Pacas, M.D. Ph.D.  [facility] [place], [place] [address] Phone: [phone] Fax:      [phone]

## 2017-04-01 ENCOUNTER — Telehealth: Payer: Self-pay | Admitting: Cardiovascular Disease

## 2017-04-01 DIAGNOSIS — I080 Rheumatic disorders of both mitral and aortic valves: Secondary | ICD-10-CM

## 2017-04-01 NOTE — Telephone Encounter (Signed)
Scheduled patient for her yearly echo before her appointment with Dr. Johnsie Cancel on 06/10/17.

## 2017-04-01 NOTE — Telephone Encounter (Signed)
New message    Pt daughter called to make an appointment and she states her mother gets an echo once per year and wants to know if she needs an echo this time around. Requests a call back.

## 2017-04-01 NOTE — Telephone Encounter (Signed)
Left message for patient to call back  

## 2017-04-01 NOTE — Telephone Encounter (Signed)
New message     Pt is returning Pams call for echo

## 2017-05-03 ENCOUNTER — Ambulatory Visit (INDEPENDENT_AMBULATORY_CARE_PROVIDER_SITE_OTHER): Payer: Medicare Other | Admitting: Family Medicine

## 2017-05-03 ENCOUNTER — Encounter: Payer: Self-pay | Admitting: Family Medicine

## 2017-05-03 VITALS — BP 118/58 | HR 94 | Temp 97.8°F | Ht 61.0 in | Wt 115.2 lb

## 2017-05-03 DIAGNOSIS — M810 Age-related osteoporosis without current pathological fracture: Secondary | ICD-10-CM | POA: Diagnosis not present

## 2017-05-03 DIAGNOSIS — K7460 Unspecified cirrhosis of liver: Secondary | ICD-10-CM | POA: Diagnosis not present

## 2017-05-03 DIAGNOSIS — G40909 Epilepsy, unspecified, not intractable, without status epilepticus: Secondary | ICD-10-CM

## 2017-05-03 DIAGNOSIS — K219 Gastro-esophageal reflux disease without esophagitis: Secondary | ICD-10-CM

## 2017-05-03 DIAGNOSIS — N183 Chronic kidney disease, stage 3 unspecified: Secondary | ICD-10-CM

## 2017-05-03 DIAGNOSIS — F5101 Primary insomnia: Secondary | ICD-10-CM | POA: Diagnosis not present

## 2017-05-03 DIAGNOSIS — Z944 Liver transplant status: Secondary | ICD-10-CM | POA: Diagnosis not present

## 2017-05-03 DIAGNOSIS — Z85828 Personal history of other malignant neoplasm of skin: Secondary | ICD-10-CM | POA: Insufficient documentation

## 2017-05-03 MED ORDER — LEVETIRACETAM 250 MG PO TABS: 250.0000 mg | ORAL_TABLET | Freq: Two times a day (BID) | ORAL | 3 refills | Status: DC

## 2017-05-03 MED ORDER — PANTOPRAZOLE SODIUM 20 MG PO TBEC: 20.0000 mg | DELAYED_RELEASE_TABLET | Freq: Every day | ORAL | 3 refills | Status: DC

## 2017-05-03 NOTE — Assessment & Plan Note (Signed)
S: acid reflux is controlled on protonix 40mg . Started more as preventative/treatment after gastric ulcer back in 2005 A/P: discussed trial of 20mg  protonix- let me know if not effective

## 2017-05-03 NOTE — Assessment & Plan Note (Signed)
S: follows with Dr. Loletha Grayer at [facility] hepatology/liver transplant center due to history of hepatic cirrhosis s/p cirrhosis due to alcohol abuse. On tacrolimus for anti rejection A/P: sees them back June 18th and will continue yearly. Every 3 months getting bloodwork faxed to [place]. Reviewed labs from 01/2017 so we will skip cbc and cmp as will get those at upcoming appt as well with [facility] -will discuss with [facility] about shingrix- I suggested doing this as has had shingles 2x in past at least

## 2017-05-03 NOTE — Progress Notes (Signed)
Subjective:  [name] is a 83 y.o. year old very pleasant female patient who presents for/with See problem oriented charting ROS- No chest pain or shortness of breath. No headache or blurry vision. No seizure like activity   Past Medical History-  Patient Active Problem List   Diagnosis Date Noted  . Seizure disorder ([place]) 09/21/2013    Priority: High  . Liver replaced by transplant ([place]) 06/08/2010    Priority: High  . Hepatic cirrhosis ([place]) 06/09/2007    Priority: High  . CKD (chronic kidney disease), stage III 05/03/2017    Priority: Medium  . Mitral valve insufficiency and aortic valve insufficiency     Priority: Medium  . Hyponatremia 05/17/2014    Priority: Medium  . Aortic insufficiency 12/03/2012    Priority: Medium  . Osteoporosis 06/09/2007    Priority: Medium  . History of skin cancer 05/03/2017    Priority: Low  . GERD (gastroesophageal reflux disease) 12/13/2014    Priority: Low  . Dizziness 05/17/2014    Priority: Low  . Insomnia 06/08/2010    Priority: Low    Medications- reviewed and updated Current Outpatient Prescriptions  Medication Sig Dispense Refill  . alendronate (FOSAMAX) 70 MG tablet Take 1 tablet (70 mg total) by mouth every 7 (seven) days. Take with a full glass of water on an empty stomach. 4 tablet 11  . aspirin EC 81 MG tablet Take 81 mg by mouth daily.    . Calcium Carbonate-Vit D-Min (CALCIUM 1200 PO) Take 1 tablet by mouth daily.    . cholecalciferol (VITAMIN D) 1000 UNITS tablet Take 1,000 Units by mouth daily.      Marland Kitchen ibuprofen (ADVIL,MOTRIN) 200 MG tablet Take 400 mg by mouth every 6 (six) hours as needed for moderate pain.    Marland Kitchen levETIRAcetam (KEPPRA) 250 MG tablet Take 1 tablet (250 mg total) by mouth 2 (two) times daily. 180 tablet 3  . Multiple Vitamin (MULTIVITAMIN) tablet Take 1 tablet by mouth daily.      . pantoprazole (PROTONIX) 20 MG tablet Take 1 tablet (20 mg total) by mouth daily. 90 tablet 3  . tacrolimus (PROGRAF) 1  MG capsule Take 2 mg by mouth 2 (two) times daily. Take 2 capsules in the morning and 2 capsule a night    . zolpidem (AMBIEN CR) 6.25 MG CR tablet Take 1 tablet (6.25 mg total) by mouth at bedtime as needed for sleep. 90 tablet 1   No current facility-administered medications for this visit.     Objective: BP (!) 118/58 (BP Location: Left Arm, Patient Position: Sitting, Cuff Size: Normal)   Pulse 94   Temp 97.8 F (36.6 C) (Oral)   Ht 5\' 1"  (1.549 m)   Wt 115 lb 3.2 oz (52.3 kg)   SpO2 94%   BMI 21.77 kg/m  Gen: NAD, resting comfortably TM normal, oropharynx normal CV: RRR no murmurs rubs or gallops Lungs: CTAB no crackles, wheeze, rhonchi Abdomen: soft/nontender/nondistended/normal bowel sounds. thin Ext: no edema Skin: warm, dry Neuro: grossly normal, moves all extremities  Assessment/Plan:  Has follow up with cardiology once a year for valvular issues  Liver replaced by transplant S: follows with Dr. Loletha Grayer at [facility] hepatology/liver transplant center due to history of hepatic cirrhosis s/p cirrhosis due to alcohol abuse. On tacrolimus for anti rejection A/P: sees them back June 18th and will continue yearly. Every 3 months getting bloodwork faxed to [place]. Reviewed labs from 01/2017 so we will skip cbc and cmp as  will get those at upcoming appt as well with [facility] -will discuss with [facility] about shingrix- I suggested doing this as has had shingles 2x in past at least  Hepatic cirrhosis See liver replacement notes  Seizure disorder S: history of seizures before transplant. No activity in years on keppra 250mg  BID. Follows with Dr. Krista Blue A/P: they are going to continue Keppra. Was given option of release from neurology- we will continue keppra for her  GERD (gastroesophageal reflux disease) S: acid reflux is controlled on protonix 40mg . Started more as preventative/treatment after gastric ulcer back in 2005 A/P: discussed trial of 20mg  protonix- let me know if not  effective  Insomnia S: insomnia controlled on ambien 6.25 mg nightly. 4-5 hours of sleep but does not sleep well at all without it.  A/P: continue current medications  CKD (chronic kidney disease), stage III S: GFR running in high 40s or 50s on last check in march at [facility].  A/P: doing well- bp controlled. Will update lipids to make sure they are ok- should avoid nsaids Medicare will not cover lipid under this diagnosis unfortunately.   Osteoporosis S: osteoporosis 06/2016 on dexa and started on fosamax at that time. She is tolerating this well.  A/P: continue current meds- repeat 2 years likely dexa  1 year- advised awv before that time  Meds ordered this encounter  Medications  . pantoprazole (PROTONIX) 20 MG tablet    Sig: Take 1 tablet (20 mg total) by mouth daily.    Dispense:  90 tablet    Refill:  3  . levETIRAcetam (KEPPRA) 250 MG tablet    Sig: Take 1 tablet (250 mg total) by mouth 2 (two) times daily.    Dispense:  180 tablet    Refill:  3    Return precautions advised.  Garret Reddish, MD

## 2017-05-03 NOTE — Assessment & Plan Note (Signed)
See liver replacement notes

## 2017-05-03 NOTE — Assessment & Plan Note (Deleted)
S: follows with Dr. Loletha Grayer at [facility] hepatology/liver transplant center due to history of hepatic cirrhosis s/p cirrhosis due to alcohol abuse. On tacrolimus for anti rejection A/P: sees them back June 18th and will continue yearly. Every 3 months getting bloodwork faxed to [place]. Reviewed labs from 01/2017 so we will skip cbc and cmp as will get those at upcoming appt as well with [facility] -will discuss with [facility] about shingrix- I suggested doing this as has had shingles 2x in past at least

## 2017-05-03 NOTE — Assessment & Plan Note (Signed)
S: insomnia controlled on ambien 6.25 mg nightly. 4-5 hours of sleep but does not sleep well at all without it.  A/P: continue current medications

## 2017-05-03 NOTE — Assessment & Plan Note (Addendum)
S: GFR running in high 40s or 50s on last check in march at [facility].  A/P: doing well- bp controlled. Will update lipids to make sure they are ok- should avoid nsaids Medicare will not cover lipid under this diagnosis unfortunately.

## 2017-05-03 NOTE — Assessment & Plan Note (Signed)
S: history of seizures before transplant. No activity in years on keppra 250mg  BID. Follows with Dr. Krista Blue A/P: they are going to continue Keppra. Was given option of release from neurology- we will continue keppra for her

## 2017-05-03 NOTE — Patient Instructions (Addendum)
Discuss new shingles vaccine (shingrix) with [facility]- we could always print this for you to get at pharmacy if you decide you want this  Trial 20mg  of protonix instead of 40mg   Medicare really does not want me to test your cholesterol- chronic kidney disease will still not cover it! Lab Results  Component Value Date   CHOL 164 03/03/2014   HDL 69.10 03/03/2014   LDLCALC 76 03/03/2014   TRIG 97.0 03/03/2014   CHOLHDL 2 03/03/2014  luckily last numbers were excellent and unlikely you have had a jump that is significant  Continue to avoid mediine like ibuprofen/aleve with your kidneys. Ok to take tylenol if [facility] is ok with it but would use very low dose 500mg  or less every 6-8 hours.   Would sign up for a day for both you and [name] to come in for annual wellness with [name] before October 1st

## 2017-05-03 NOTE — Assessment & Plan Note (Signed)
S: osteoporosis 06/2016 on dexa and started on fosamax at that time. She is tolerating this well.  A/P: continue current meds- repeat 2 years likely dexa

## 2017-05-20 DIAGNOSIS — D899 Disorder involving the immune mechanism, unspecified: Secondary | ICD-10-CM | POA: Diagnosis not present

## 2017-05-20 DIAGNOSIS — Z944 Liver transplant status: Secondary | ICD-10-CM | POA: Diagnosis not present

## 2017-05-23 ENCOUNTER — Encounter: Payer: Self-pay | Admitting: Cardiovascular Disease

## 2017-06-08 NOTE — Progress Notes (Signed)
Cardiology Office Note:    Date:  06/10/2017   ID:  [name], DOB Nov 06, 1932, MRN [id number]  PCP:  Marin Olp, MD  Cardiologist:  Dr. Jenkins Rouge   Electrophysiologist:  N/a Hepatologist/Live Transplant at [facility]: Dr. Enis Gash Neurologist: Dr. Krista Blue  Referring MD: Marin Olp, MD   No chief complaint on file.   History of Present Illness:    [name] is a 83 y.o. female with a hx of valvular heart disease with mod AI and mod MR, hepatic cirrhosis s/p liver transplant, seizure d/o.    She is the primary caregiver for her husband who has dementia. They moved into an apartment this past year.  The patient denies chest pain, shortness of breath, syncope, orthopnea, PND or significant pedal edema.   Echo:  06/18/16 Study Conclusions  - Left ventricle: The cavity size was normal. Systolic function was   normal. The estimated ejection fraction was in the range of 55%   to 60%. Wall motion was normal; there were no regional wall   motion abnormalities. Features are consistent with a pseudonormal   left ventricular filling pattern, with concomitant abnormal   relaxation and increased filling pressure (grade 2 diastolic   dysfunction). - Aortic valve: There was moderate regurgitation. - Mitral valve: Severely calcified annulus. Mildly thickened   leaflets . The findings are consistent with mild stenosis. There   was moderate regurgitation. Valve area by pressure half-time:   2.08 cm^2. Valve area by continuity equation (using LVOT flow):   1.61 cm^2. - Left atrium: The atrium was moderately dilated. - Pulmonic valve: There was mild regurgitation. - Pulmonary arteries: PA peak pressure: 33 mm Hg (S).  Echo from today reviewed and no change She continues to be asymptomatic Has some pain in her legs from varicose Veins   Past Medical History:  Diagnosis Date  . Cirrhosis [facility])    s/p Liver Transplant - follow at [facility]  . History of cardiac catheterization     a. LHC 06/2006: EF 65-70%, pLAD 20%  . History of GI bleed    patient denies  . Mitral valve insufficiency and aortic valve insufficiency    a. Echo 3/14:  Mild LVH, EF 55-65%, Gr 1 DD, moderate AI, MAC, mild mitral stenosis, mild to mod MR, mod LAE, PASP 34 mmHg //  b. Echo 6/15: Mild LVH, EF 55-60%, Gr 2 DD, mode AI, MAC, trivial mitral stenosis (mean gradient 5 mmHg), mod MR, mod LAE, PASP 30 mmHg  //  c. Echo 6/16: EF 55-60%, no RWMA, Gr 2 DD, mod AI, mildly dilated Asc aorta (38 mm), MAC, mod MS, moderate MR, PASP 31 mmHg   . Osteoporosis   . Seizure disorder [facility])    followed by Neuro    Past Surgical History:  Procedure Laterality Date  . LIVER TRANSPLANT  08/08    Current Medications: Outpatient Medications Prior to Visit  Medication Sig Dispense Refill  . alendronate (FOSAMAX) 70 MG tablet Take 1 tablet (70 mg total) by mouth every 7 (seven) days. Take with a full glass of water on an empty stomach. 4 tablet 11  . aspirin EC 81 MG tablet Take 81 mg by mouth daily.    . Calcium Carbonate-Vit D-Min (CALCIUM 1200 PO) Take 1 tablet by mouth daily.    . cholecalciferol (VITAMIN D) 1000 UNITS tablet Take 1,000 Units by mouth daily.      Marland Kitchen levETIRAcetam (KEPPRA) 250 MG tablet Take 1 tablet (250  mg total) by mouth 2 (two) times daily. 180 tablet 3  . Multiple Vitamin (MULTIVITAMIN) tablet Take 1 tablet by mouth daily.      . pantoprazole (PROTONIX) 20 MG tablet Take 1 tablet (20 mg total) by mouth daily. 90 tablet 3  . tacrolimus (PROGRAF) 1 MG capsule Take 2 mg by mouth 2 (two) times daily. Take 2 capsules in the morning and 2 capsule a night    . zolpidem (AMBIEN CR) 6.25 MG CR tablet Take 1 tablet (6.25 mg total) by mouth at bedtime as needed for sleep. 90 tablet 1  . ibuprofen (ADVIL,MOTRIN) 200 MG tablet Take 400 mg by mouth every 6 (six) hours as needed for moderate pain.     No facility-administered medications prior to visit.       Allergies:   Nsaids and Codeine    Social History   Social History  . Marital status: Married    Spouse name: [name]   . Number of children: 3  . Years of education: 12th   Occupational History  . Retired    Social History Main Topics  . Smoking status: Former Research scientist (life sciences)  . Smokeless tobacco: Never Used  . Alcohol use No     Comment: h/o alcohol abuse  . Drug use: No  . Sexual activity: Not Asked   Other Topics Concern  . None   Social History Narrative   Patient lives at home with husband [name].    Daughter lives close and extremely supportive      Patient has 3 children. 4 grandchildren. No greatgrandchildren.    Patient has a high school education level.    Patient is retired from [employer], cared for her children      Hobbies: dancing ([place] singing group)-fewer and fewer get togethers     Family History:  The patient's family history includes Heart attack (age of onset: 61) in her mother.   ROS:   Please see the history of present illness.    Review of Systems  Hematologic/Lymphatic: Bruises/bleeds easily.   All other systems reviewed and are negative.   Physical Exam:    VS:  BP 118/60   Pulse 70   Ht 5' 1"  (1.549 m)   Wt 115 lb (52.2 kg)   LMP  (LMP Unknown)   BMI 21.73 kg/m     Wt Readings from Last 3 Encounters:  06/10/17 115 lb (52.2 kg)  05/03/17 115 lb 3.2 oz (52.3 kg)  03/05/17 116 lb (52.6 kg)     Physical Exam  Constitutional: She is oriented to person, place, and time. She appears well-developed and well-nourished. No distress.  HENT:  Head: Normocephalic and atraumatic.  Neck: No JVD present.  Cardiovascular: Regular rhythm, S1 normal and S2 normal.   Murmur heard.  Low-pitched diastolic murmur is present with a grade of 2/6  3rd LICS Pulmonary/Chest: Effort normal. She has no wheezes. She has no rales.  Abdominal: Soft. There is no tenderness.  Musculoskeletal: She exhibits no edema.  Neurological: She is alert and oriented to person, place, and time.  Skin:  Skin is warm and dry.  LE varicosities LLE worse with mild edema  Psychiatric: She has a normal mood and affect.    Studies/Labs Reviewed:    EKG:  EKG is  ordered today.  The ekg ordered today demonstrates NSR, HR 61, normal axis, QTc 420 ms, no sig change since prior tracing  Recent Labs: No results found for requested labs within last 8760  hours.   Recent Lipid Panel    Component Value Date/Time   CHOL 164 03/03/2014 0907   TRIG 97.0 03/03/2014 0907   HDL 69.10 03/03/2014 0907   CHOLHDL 2 03/03/2014 0907   VLDL 19.4 03/03/2014 0907   LDLCALC 76 03/03/2014 0907    Additional studies/ records that were reviewed today include:   Echo 6/16 EF 55-60%, no RWMA, Gr 2 DD, mod AI, mildly dilated Asc aorta (38 mm), MAC, mod MS, moderate MR, PASP 31 mmHg  Echo 6/15 Mild LVH, EF 01-75%, grade 2 diastolic dysfunction, moderate AI, MAC, trivial mitral stenosis (mean gradient 5 mmHg), moderate MR, moderate LAE, PASP 30 mmHg  Echo 3/14 Mild LVH, EF 10-25%, grade 1 diastolic dysfunction, moderate AI, MAC, mild mitral stenosis, mild to moderate MR, moderate LAE, PASP 34 mmHg  LHC 7/07 EF 65-70% LM normal Proximal LAD irregularity 20%  ASSESSMENT:    Valve Disease  PLAN:    In order of problems listed above:  1. AV Disease:  Moderate AR 2. MV Disease:  Moderate MR/mild MS 3. Liver Transplant: followed at [facility] on prograf 4. Seizure: on keppra none since transplant f/u Dr Krista Blue  5. Varicose Veins:  Encouraged use of compression stockings elevate legs latter in day when possible   Jenkins Rouge

## 2017-06-10 ENCOUNTER — Ambulatory Visit (INDEPENDENT_AMBULATORY_CARE_PROVIDER_SITE_OTHER): Payer: Medicare Other | Admitting: Cardiovascular Disease

## 2017-06-10 ENCOUNTER — Encounter: Payer: Self-pay | Admitting: Cardiovascular Disease

## 2017-06-10 ENCOUNTER — Ambulatory Visit (HOSPITAL_COMMUNITY): Payer: Medicare Other | Attending: Cardiovascular Disease

## 2017-06-10 ENCOUNTER — Other Ambulatory Visit: Payer: Self-pay

## 2017-06-10 VITALS — BP 118/60 | HR 70 | Ht 61.0 in | Wt 115.0 lb

## 2017-06-10 DIAGNOSIS — I080 Rheumatic disorders of both mitral and aortic valves: Secondary | ICD-10-CM | POA: Diagnosis not present

## 2017-06-10 DIAGNOSIS — I351 Nonrheumatic aortic (valve) insufficiency: Secondary | ICD-10-CM | POA: Diagnosis not present

## 2017-06-10 NOTE — Patient Instructions (Addendum)

## 2017-06-27 ENCOUNTER — Other Ambulatory Visit: Payer: Self-pay | Admitting: Family Medicine

## 2017-07-15 ENCOUNTER — Encounter: Payer: Self-pay | Admitting: Family Medicine

## 2017-08-27 ENCOUNTER — Other Ambulatory Visit: Payer: Self-pay

## 2017-08-27 MED ORDER — ZOLPIDEM TARTRATE ER 6.25 MG PO TBCR: 6.2500 mg | EXTENDED_RELEASE_TABLET | Freq: Every evening | ORAL | 1 refills | Status: DC | PRN

## 2017-08-28 DIAGNOSIS — Z48298 Encounter for aftercare following other organ transplant: Secondary | ICD-10-CM | POA: Diagnosis not present

## 2017-08-28 DIAGNOSIS — Z944 Liver transplant status: Secondary | ICD-10-CM | POA: Diagnosis not present

## 2017-08-28 DIAGNOSIS — Z418 Encounter for other procedures for purposes other than remedying health state: Secondary | ICD-10-CM | POA: Diagnosis not present

## 2017-09-09 DIAGNOSIS — H35033 Hypertensive retinopathy, bilateral: Secondary | ICD-10-CM | POA: Diagnosis not present

## 2017-09-09 DIAGNOSIS — H353132 Nonexudative age-related macular degeneration, bilateral, intermediate dry stage: Secondary | ICD-10-CM | POA: Diagnosis not present

## 2017-09-09 DIAGNOSIS — H2513 Age-related nuclear cataract, bilateral: Secondary | ICD-10-CM | POA: Diagnosis not present

## 2017-09-09 DIAGNOSIS — H25013 Cortical age-related cataract, bilateral: Secondary | ICD-10-CM | POA: Diagnosis not present

## 2017-11-12 ENCOUNTER — Other Ambulatory Visit: Payer: Self-pay | Admitting: Family Medicine

## 2017-11-18 ENCOUNTER — Ambulatory Visit (INDEPENDENT_AMBULATORY_CARE_PROVIDER_SITE_OTHER): Payer: Medicare Other

## 2017-11-18 DIAGNOSIS — Z23 Encounter for immunization: Secondary | ICD-10-CM

## 2017-11-21 DIAGNOSIS — Z944 Liver transplant status: Secondary | ICD-10-CM | POA: Diagnosis not present

## 2017-12-02 DIAGNOSIS — Z944 Liver transplant status: Secondary | ICD-10-CM | POA: Diagnosis not present

## 2018-02-06 ENCOUNTER — Other Ambulatory Visit: Payer: Self-pay

## 2018-02-06 MED ORDER — ZOLPIDEM TARTRATE ER 6.25 MG PO TBCR: 6.2500 mg | EXTENDED_RELEASE_TABLET | Freq: Every evening | ORAL | 1 refills | Status: DC | PRN

## 2018-02-12 ENCOUNTER — Telehealth: Payer: Self-pay | Admitting: Family Medicine

## 2018-02-12 NOTE — Telephone Encounter (Signed)
Copied from [place] [id number]. Topic: Quick Communication - See Telephone Encounter >> Feb 12, 2018 10:03 AM Bea Graff, NT wrote: CRM for notification. See Telephone encounter for: [facility] is needing a nurse to call an verify the order for zolpidem (AMBIEN CR). CB#: [phone] Ref#: [id number]  02/12/18.

## 2018-02-12 NOTE — Telephone Encounter (Signed)
Spoke with [employer].

## 2018-02-21 DIAGNOSIS — Z944 Liver transplant status: Secondary | ICD-10-CM | POA: Diagnosis not present

## 2018-03-11 ENCOUNTER — Ambulatory Visit (INDEPENDENT_AMBULATORY_CARE_PROVIDER_SITE_OTHER): Payer: Medicare Other | Admitting: Family Medicine

## 2018-03-11 ENCOUNTER — Encounter: Payer: Self-pay | Admitting: Family Medicine

## 2018-03-11 VITALS — BP 110/72 | HR 100 | Temp 98.5°F | Resp 18 | Ht 61.0 in | Wt 117.0 lb

## 2018-03-11 DIAGNOSIS — J209 Acute bronchitis, unspecified: Secondary | ICD-10-CM | POA: Diagnosis not present

## 2018-03-11 MED ORDER — BENZONATATE 200 MG PO CAPS: 200.0000 mg | ORAL_CAPSULE | Freq: Two times a day (BID) | ORAL | 0 refills | Status: DC | PRN

## 2018-03-11 MED ORDER — METHYLPREDNISOLONE ACETATE 80 MG/ML IJ SUSP
80.0000 mg | Freq: Once | INTRAMUSCULAR | Status: AC
  Administered 2018-03-11: 80 mg via INTRAMUSCULAR

## 2018-03-11 MED ORDER — AZITHROMYCIN 250 MG PO TABS: ORAL_TABLET | ORAL | 0 refills | Status: DC

## 2018-03-11 NOTE — Progress Notes (Signed)
    Subjective:  [name] is a 84 y.o. female who presents today for same-day appointment with a chief complaint of cough.   HPI:  Cough, Acute Issue Started about 10 days ago. Worsened over the last few days. Associated with some shortness of breath.  No fevers or chills.  No body aches.  She has had more malaise and weakness as well.  She has tried taking Delsym and Mucinex D which have not helped.  No other obvious alleviating or aggravating factors.  ROS: Per HPI  PMH: She reports that she has quit smoking. She has never used smokeless tobacco. She reports that she does not drink alcohol or use drugs.  Objective:  Physical Exam: BP 110/72 (BP Location: Left Arm)   Pulse 100   Temp 98.5 F (36.9 C) (Oral)   Resp 18   Ht 5\' 1"  (1.549 m)   Wt 117 lb (53.1 kg)   LMP  (LMP Unknown)   SpO2 99%   BMI 22.11 kg/m   Gen: NAD, resting comfortably HEENT: Right TM clear. Left TM not visualized.  CV: RRR with no murmurs appreciated Pulm: NWOB, Occasional ronchi. No crackles or wheeze.   Assessment/Plan:  Acute bronchitis No red flag signs or symptoms.  Occasional rhonchi on pulmonary exam, otherwise clear.  Vital signs stable.  Start azithromycin.  Start Tessalon.  We will give 80 mg IM Depo-Medrol today.  Encouraged good oral hydration.  Return precautions reviewed.  Follow-up as needed.  Algis Greenhouse. Jerline Pain, MD 03/11/2018 2:14 PM

## 2018-03-11 NOTE — Patient Instructions (Addendum)
Start the zpack and tessalon.   Please stay well hydrated.  Please let me know if your symptoms worsen or fail to improve.  Take care, Dr Jerline Pain

## 2018-04-04 ENCOUNTER — Encounter: Payer: Self-pay | Admitting: Family Medicine

## 2018-04-04 ENCOUNTER — Ambulatory Visit (INDEPENDENT_AMBULATORY_CARE_PROVIDER_SITE_OTHER): Payer: Medicare Other

## 2018-04-04 ENCOUNTER — Telehealth: Payer: Self-pay | Admitting: Family Medicine

## 2018-04-04 ENCOUNTER — Ambulatory Visit (INDEPENDENT_AMBULATORY_CARE_PROVIDER_SITE_OTHER): Payer: Medicare Other | Admitting: Family Medicine

## 2018-04-04 VITALS — BP 112/76 | HR 76 | Temp 98.2°F | Ht 61.0 in | Wt 114.2 lb

## 2018-04-04 DIAGNOSIS — R05 Cough: Secondary | ICD-10-CM | POA: Diagnosis not present

## 2018-04-04 DIAGNOSIS — Z944 Liver transplant status: Secondary | ICD-10-CM

## 2018-04-04 DIAGNOSIS — R0602 Shortness of breath: Secondary | ICD-10-CM

## 2018-04-04 DIAGNOSIS — J329 Chronic sinusitis, unspecified: Secondary | ICD-10-CM

## 2018-04-04 DIAGNOSIS — R059 Cough, unspecified: Secondary | ICD-10-CM

## 2018-04-04 DIAGNOSIS — B9689 Other specified bacterial agents as the cause of diseases classified elsewhere: Secondary | ICD-10-CM | POA: Diagnosis not present

## 2018-04-04 DIAGNOSIS — N183 Chronic kidney disease, stage 3 unspecified: Secondary | ICD-10-CM

## 2018-04-04 DIAGNOSIS — G40909 Epilepsy, unspecified, not intractable, without status epilepticus: Secondary | ICD-10-CM

## 2018-04-04 DIAGNOSIS — J449 Chronic obstructive pulmonary disease, unspecified: Secondary | ICD-10-CM | POA: Diagnosis not present

## 2018-04-04 DIAGNOSIS — K7460 Unspecified cirrhosis of liver: Secondary | ICD-10-CM | POA: Diagnosis not present

## 2018-04-04 MED ORDER — METHYLPREDNISOLONE ACETATE 80 MG/ML IJ SUSP
80.0000 mg | Freq: Once | INTRAMUSCULAR | Status: AC
  Administered 2018-04-04: 80 mg via INTRAMUSCULAR

## 2018-04-04 MED ORDER — AMOXICILLIN-POT CLAVULANATE 875-125 MG PO TABS: 1.0000 | ORAL_TABLET | Freq: Two times a day (BID) | ORAL | 0 refills | Status: AC

## 2018-04-04 NOTE — Telephone Encounter (Signed)
Copied from [place] [id number]. Topic: Quick Communication - Rx Refill/Question >> Apr 04, 2018  5:28 PM Marin Olp L wrote: Medication:amoxicillin-clavulanate (AUGMENTIN) 875-125 MG tablet  Has the patient contacted their pharmacy? Yes.   (Agent: If no, request that the patient contact the pharmacy for the refill.) Preferred Pharmacy (with phone number or street name): [id number] [address], [place] [address] Phone: [phone] Fax: [phone] Agent: Please be advised that RX refills may take up to 3 business days. We ask that you follow-up with your pharmacy.  Script was sent to wrong pharm. [facility] and call patient once completed.

## 2018-04-04 NOTE — Patient Instructions (Addendum)
Stop by x-ray before you leave  We will cover for potential sinusitis with Augmentin.  This should also provide additional coverage for potential pneumonia.  Since he responded so well to the steroid injection last time we will repeat that today with Depo-Medrol 80 mg IM  Please see [place] back if you have fever or worsening shortness of breath.  Also see [place] back if you fail to improve after the 7 days of treatment

## 2018-04-04 NOTE — Progress Notes (Signed)
Subjective:  [name] is a 84 y.o. year old very pleasant female patient who presents for/with See problem oriented charting ROS- no chest pain. Does have some shortness of breath. Has nasal congestion- had pressure in sinuses as of yesterday but better today.    Past Medical History-  Patient Active Problem List   Diagnosis Date Noted  . Seizure disorder ([place]) 09/21/2013    Priority: High  . Liver replaced by transplant ([place]) 06/08/2010    Priority: High  . Hepatic cirrhosis ([place]) 06/09/2007    Priority: High  . CKD (chronic kidney disease), stage III ([place]) 05/03/2017    Priority: Medium  . Mitral valve insufficiency and aortic valve insufficiency     Priority: Medium  . Hyponatremia 05/17/2014    Priority: Medium  . Aortic insufficiency 12/03/2012    Priority: Medium  . Osteoporosis 06/09/2007    Priority: Medium  . History of skin cancer 05/03/2017    Priority: Low  . GERD (gastroesophageal reflux disease) 12/13/2014    Priority: Low  . Dizziness 05/17/2014    Priority: Low  . Insomnia 06/08/2010    Priority: Low    Medications- reviewed and updated Current Outpatient Medications  Medication Sig Dispense Refill  . alendronate (FOSAMAX) 70 MG tablet TAKE 1 TABLET BY MOUTH  EVERY 7 DAYS WITH A FULL  GLASS OF WATER ON AN EMPTY  STOMACH 12 tablet 1  . amoxicillin-clavulanate (AUGMENTIN) 875-125 MG tablet Take 1 tablet by mouth 2 (two) times daily for 7 days. 14 tablet 0  . aspirin EC 81 MG tablet Take 81 mg by mouth daily.    . benzonatate (TESSALON) 200 MG capsule Take 1 capsule (200 mg total) by mouth 2 (two) times daily as needed for cough. 20 capsule 0  . Calcium Carbonate-Vit D-Min (CALCIUM 1200 PO) Take 1 tablet by mouth daily.    . cholecalciferol (VITAMIN D) 1000 UNITS tablet Take 1,000 Units by mouth daily.      Marland Kitchen guaiFENesin (MUCINEX) 600 MG 12 hr tablet Take 600 mg by mouth 2 (two) times daily.    Marland Kitchen levETIRAcetam (KEPPRA) 250 MG tablet Take 1 tablet (250 mg  total) by mouth 2 (two) times daily. 180 tablet 3  . Multiple Vitamin (MULTIVITAMIN) tablet Take 1 tablet by mouth daily.      . pantoprazole (PROTONIX) 20 MG tablet Take 1 tablet (20 mg total) by mouth daily. 90 tablet 3  . tacrolimus (PROGRAF) 1 MG capsule Take 2 mg by mouth 2 (two) times daily. Take 2 capsules in the morning and 2 capsule a night    . zolpidem (AMBIEN CR) 6.25 MG CR tablet Take 1 tablet (6.25 mg total) by mouth at bedtime as needed for sleep. 90 tablet 1   No current facility-administered medications for this visit.     Objective: BP 112/76 (BP Location: Left Arm, Patient Position: Sitting, Cuff Size: Normal)   Pulse 76   Temp 98.2 F (36.8 C) (Oral)   Ht 5\' 1"  (1.549 m)   Wt 114 lb 3.2 oz (51.8 kg)   LMP  (LMP Unknown)   SpO2 98%   BMI 21.58 kg/m  Gen: NAD, resting comfortably-does not appear in any apparent distress TM normal.  CV: RRR baseline murmur noted Lungs: Slight crackles in the right base of lung.  No wheeze or rhonchi Abdomen: soft/nontender/nondistended/normal bowel sounds.  Thin Ext: no edema Skin: warm, dry Neuro: Normal gait and speech  Assessment/Plan:   Cough - Plan: DG Chest  2 View  SOB (shortness of breath) - Plan: DG Chest 2 View  Bacterial sinusitis - Plan: methylPREDNISolone acetate (DEPO-MEDROL) injection 80 mg  Cirrhosis of liver without ascites, unspecified hepatic cirrhosis type (HCC)  Liver replaced by transplant ([place])  Seizure disorder (HCC)  CKD (chronic kidney disease), stage III ([place]) S: Patient saw Dr. Jerline Pain about a month ago for bronchitis after 10 days of symptoms. Treated with azithromycin and depo medrol. She is high risk with liver transplant. Had improved significantly then 3-4 days ago symptoms worsened- seemed to be short of breath with conversations, nasal congestion which is new, cough dry but felt like chest congestion with it. Blowing out clear discharge. Fatigue has recurred. Taking delysm for cough,  mucinex DM. Has had sinus pressure.  A/P: 84 year old female who is high risk due to CKD III, seizure disorder controlled, history hepatic cirrhosis and replacement of liver followed by [facility] presenting with 6 weeks of cough which improved drastically then worsened over 3-4 days. Also with significant worsening in sinus congestion and pressure in that time- potential bacterial sinusitis and will cover with augumentin. Will get x-ray to rule out pneumonia though between prior azithromycin and now augmentin has pretty reasonable coverage for this. She also responded very well to steroid injection last time so we will retrial as may also help with sinuses.   Future Appointments  Date Time Provider [place]  05/14/2018  8:45 AM Marin Olp, MD [facility]  07/02/2018  1:00 PM [facility] ECHO 1 [facility]  07/02/2018  2:30 PM Josue Hector, MD [facility]   Lab/Order associations: Cough - Plan: DG Chest 2 View  SOB (shortness of breath) - Plan: DG Chest 2 View  Bacterial sinusitis - Plan: methylPREDNISolone acetate (DEPO-MEDROL) injection 80 mg  Meds ordered this encounter  Medications  . amoxicillin-clavulanate (AUGMENTIN) 875-125 MG tablet    Sig: Take 1 tablet by mouth 2 (two) times daily for 7 days.    Dispense:  14 tablet    Refill:  0  . methylPREDNISolone acetate (DEPO-MEDROL) injection 80 mg    Return precautions advised.  Garret Reddish, MD

## 2018-04-06 ENCOUNTER — Other Ambulatory Visit: Payer: Self-pay | Admitting: Family Medicine

## 2018-04-07 ENCOUNTER — Encounter: Payer: Self-pay | Admitting: Family Medicine

## 2018-04-07 NOTE — Telephone Encounter (Signed)
[facility]

## 2018-04-07 NOTE — Telephone Encounter (Signed)
Spoke with patient and she advised that pharmacy was able to transfer rx so she does have it. No further action needed.

## 2018-05-08 IMAGING — DX DG CHEST 2V
2 series · 2 of 2 positions shown · non-contrast
Comparison: PA and lateral chest x-ray April 29, 2014

CLINICAL DATA: Six weeks of chest congestion with increased
productive cough over the past 4 days. The patient has been recently
treated with antibiotics and steroids. Abnormal breath sounds at the
right base. Former smoker.

EXAM:
CHEST - 2 VIEW

[chest pa]
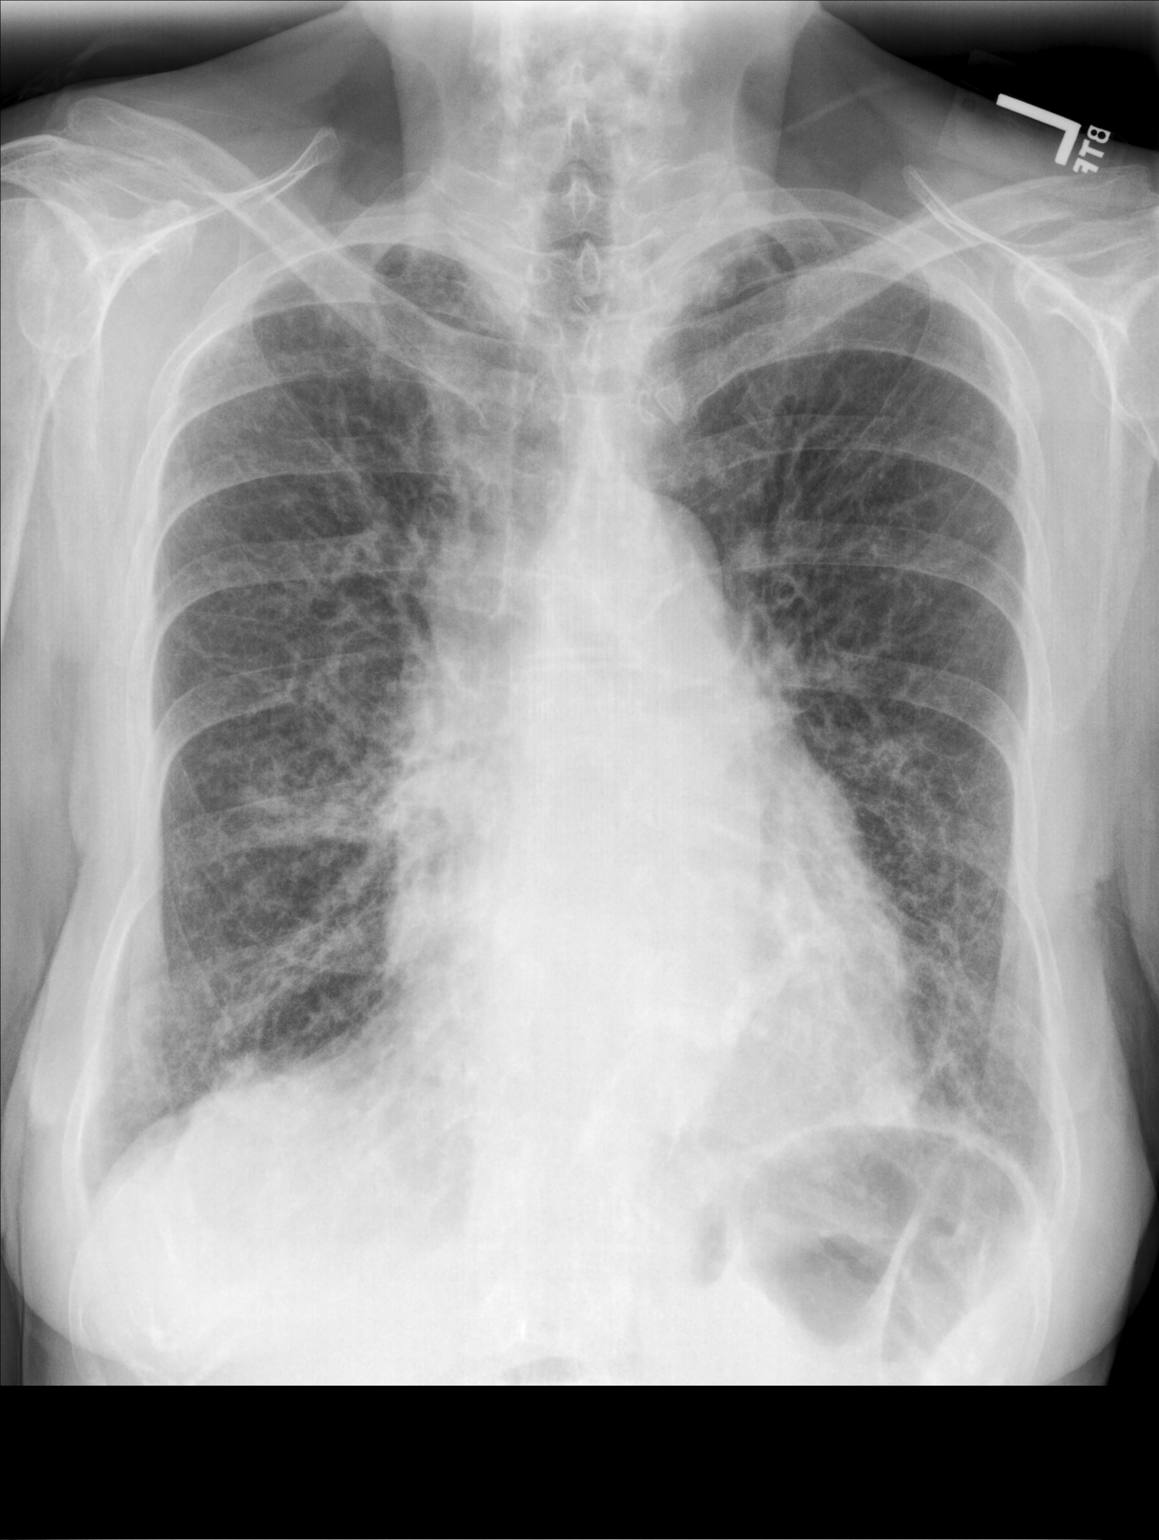

[chest lat]
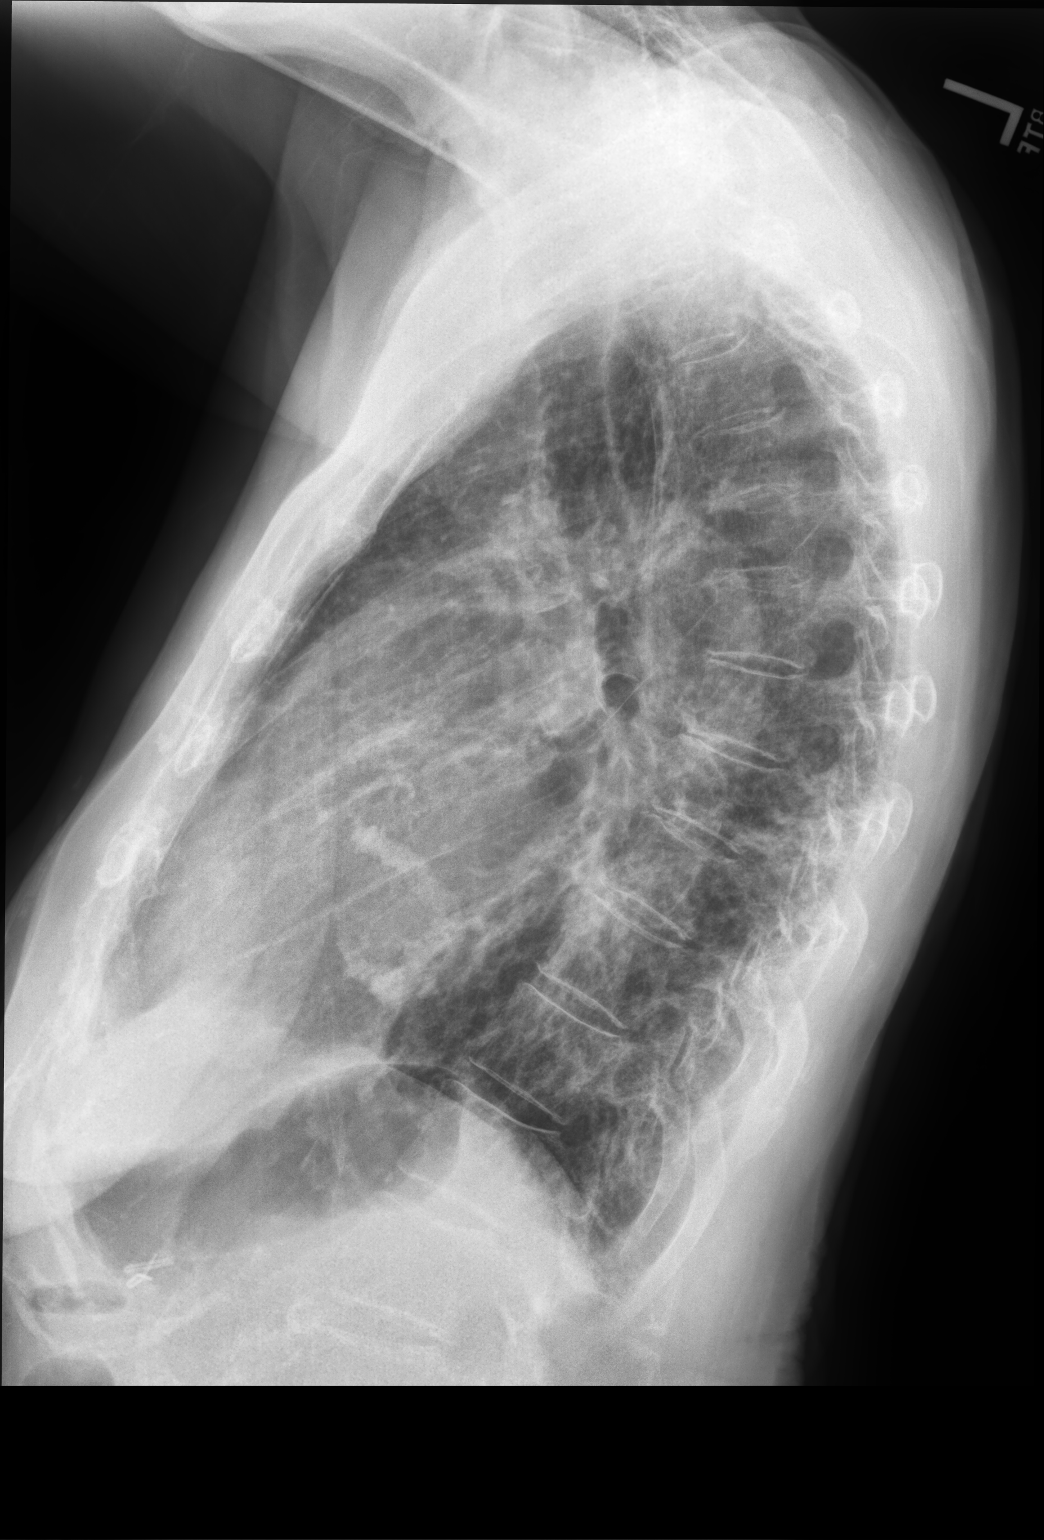

[2 of 2 positions shown; findings below may reference images not displayed]

FINDINGS: The lungs remain hyperinflated with mild hemidiaphragm flattening.
The interstitial markings are coarse and have become more
conspicuous since the previous study. There is increased density in
the lingula.. The heart is mildly enlarged and more conspicuous
today. The pulmonary vascularity is prominent centrally. There is
calcification in the wall of the thoracic aorta. There is no pleural
effusion. The bony thorax exhibits no acute abnormality. There is
dense calcification in the mitral valvular annulus.
IMPRESSION: COPD with pulmonary fibrotic changes. Possible subsegmental
atelectasis in the lingula but no discrete pneumonia. Increased
cardiac size and central pulmonary vascular congestion suggests mild
CHF.

Thoracic aortic atherosclerosis.

## 2018-05-14 ENCOUNTER — Encounter: Payer: Self-pay | Admitting: Family Medicine

## 2018-05-14 ENCOUNTER — Ambulatory Visit (INDEPENDENT_AMBULATORY_CARE_PROVIDER_SITE_OTHER): Payer: Medicare Other | Admitting: Family Medicine

## 2018-05-14 VITALS — BP 118/58 | HR 76 | Temp 97.6°F | Ht 61.0 in | Wt 114.6 lb

## 2018-05-14 DIAGNOSIS — K7460 Unspecified cirrhosis of liver: Secondary | ICD-10-CM

## 2018-05-14 DIAGNOSIS — G40909 Epilepsy, unspecified, not intractable, without status epilepticus: Secondary | ICD-10-CM

## 2018-05-14 DIAGNOSIS — Z944 Liver transplant status: Secondary | ICD-10-CM | POA: Diagnosis not present

## 2018-05-14 DIAGNOSIS — N183 Chronic kidney disease, stage 3 unspecified: Secondary | ICD-10-CM

## 2018-05-14 DIAGNOSIS — F5101 Primary insomnia: Secondary | ICD-10-CM | POA: Diagnosis not present

## 2018-05-14 DIAGNOSIS — M810 Age-related osteoporosis without current pathological fracture: Secondary | ICD-10-CM

## 2018-05-14 DIAGNOSIS — K219 Gastro-esophageal reflux disease without esophagitis: Secondary | ICD-10-CM | POA: Diagnosis not present

## 2018-05-14 NOTE — Assessment & Plan Note (Signed)
S: Last year we asked patient to trial 20 mg of Protonix down from 40 mg.  She had been on this due to history of gastric ulcer in 2005. A/P: continue current medicine

## 2018-05-14 NOTE — Assessment & Plan Note (Signed)
S: GFR has been running in the 40s or 50s.  Monitored at [facility].  Blood pressure controlled-no history hyperlipidemia.  Her last few checks have been in the high 50s A/P: Continue to monitor

## 2018-05-14 NOTE — Progress Notes (Signed)
Subjective:  [name] is a 84 y.o. year old very pleasant female patient who presents for/with See problem oriented charting ROS- No chest pain or shortness of breath. No headache or blurry vision. Some fatigue due to poor sleep even with Lorrin Mais   Past Medical History-  Patient Active Problem List   Diagnosis Date Noted  . Seizure disorder ([place]) 09/21/2013    Priority: High  . Liver replaced by transplant ([place]) 06/08/2010    Priority: High  . Hepatic cirrhosis ([place]) 06/09/2007    Priority: High  . CKD (chronic kidney disease), stage III ([place]) 05/03/2017    Priority: Medium  . Mitral valve insufficiency and aortic valve insufficiency     Priority: Medium  . Hyponatremia 05/17/2014    Priority: Medium  . Aortic insufficiency 12/03/2012    Priority: Medium  . Insomnia 06/08/2010    Priority: Medium  . Osteoporosis 06/09/2007    Priority: Medium  . History of skin cancer 05/03/2017    Priority: Low  . GERD (gastroesophageal reflux disease) 12/13/2014    Priority: Low  . Dizziness 05/17/2014    Priority: Low    Medications- reviewed and updated Current Outpatient Medications  Medication Sig Dispense Refill  . alendronate (FOSAMAX) 70 MG tablet TAKE 1 TABLET BY MOUTH  EVERY 7 DAYS WITH A FULL  GLASS OF WATER ON AN EMPTY  STOMACH 12 tablet 1  . aspirin EC 81 MG tablet Take 81 mg by mouth daily.    . Calcium Carbonate-Vit D-Min (CALCIUM 1200 PO) Take 1 tablet by mouth daily.    . cholecalciferol (VITAMIN D) 1000 UNITS tablet Take 1,000 Units by mouth daily.      Marland Kitchen levETIRAcetam (KEPPRA) 250 MG tablet Take 1 tablet (250 mg total) by mouth 2 (two) times daily. 180 tablet 3  . Multiple Vitamin (MULTIVITAMIN) tablet Take 1 tablet by mouth daily.      . pantoprazole (PROTONIX) 20 MG tablet Take 1 tablet (20 mg total) by mouth daily. 90 tablet 3  . tacrolimus (PROGRAF) 1 MG capsule Take 2 mg by mouth 2 (two) times daily. Take 2 capsules in the morning and 2 capsule a night    .  zolpidem (AMBIEN CR) 6.25 MG CR tablet Take 1 tablet (6.25 mg total) by mouth at bedtime as needed for sleep. 90 tablet 1   No current facility-administered medications for this visit.     Objective: BP (!) 118/58 (BP Location: Left Arm, Patient Position: Sitting, Cuff Size: Normal)   Pulse 76   Temp 97.6 F (36.4 C) (Oral)   Ht 5\' 1"  (1.549 m)   Wt 114 lb 9.6 oz (52 kg)   LMP  (LMP Unknown)   SpO2 98%   BMI 21.65 kg/m  Gen: NAD, resting comfortably, thin, appears stated age CV: RRR no murmurs rubs or gallops Lungs: CTAB no crackles, wheeze, rhonchi Abdomen: soft/nontender/nondistended/normal bowel sounds.  Ext: no edema Skin: warm, dry  Assessment/Plan:  Other notes: 1.Follows with dermatology yearly given history of skin cancer. Has appointment scheduled but she is not really sure she wants to go back 4. PHQ9 of 3- upcoming fathers day and lost her husband last year as reason. PHQ9 less than 5- denies depressed mood so wouldn't meet criteria depression anyway  Liver replaced by transplant Status post liver transplant  s: Continues to follow with Dr. Loletha Grayer is due to pathology/liver transplant center due to history of hepatic cirrhosis related to alcohol abuse.  She is on tacrolimus for  antirejection.  She gets labs monitored regularly by [facility]-largely reassuring- does have intermittent mild hyponatremia.  She sees them each June- scheduled for the 24th A/P: doing well- continue current medications   Seizure disorder S: Patient has a history of seizures before her liver transplant.  She has had no seizure-like activity on Keppra 250 mg twice daily.  She followed with Dr. Krista Blue but was released to primary care to follow on same regimen A/P: Continue Keppra 250 mg twice daily- this is her long-term plan- neurology did not suggest to wean  GERD (gastroesophageal reflux disease) S: Last year we asked patient to trial 20 mg of Protonix down from 40 mg.  She had been on this due to  history of gastric ulcer in 2005. A/P: continue current medicine  Insomnia S: Insomnia controlled on Ambien 6.25 mg XR nightly.  She gets 4 to 5 hours of sleep with medicine but does not sleep at all reportedly without medication. States has never been a good sleeper A/P: Continue current medication as long as no falls or confusion  CKD (chronic kidney disease), stage III S: GFR has been running in the 40s or 50s.  Monitored at [facility].  Blood pressure controlled-no history hyperlipidemia.  Her last few checks have been in the high 50s A/P: Continue to monitor  Osteoporosis S: Patient has been compliant with Fosamax once a week- calcium and vitamin D as well.  She is tolerating these medications well.  Recommended bone density sometime in July or later for 2-year follow-up A/P: update dexa   From AVS:  " If [facility] is willing-  1. with long term protonix use- would love to have b12 drawn under high risk medication 2. Since they are drawing blood- can they draw a lipid panel for screening purposes though I would not likely start you on medicine 3. I am ok with you stopping aspirin- I want you to double check with [facility] to make sure they are ok with it as well.  "  Future Appointments  Date Time Provider [place]  06/19/2018  4:00 PM [facility] DEXA 1 [facility]  07/02/2018  1:00 PM [facility] ECHO 1 [facility]  07/02/2018  2:30 PM Josue Hector, MD [facility]   Return in about 1 year (around 05/15/2019) for annual wellness visit with nurse, can see me same day for medication follow up.  Lab/Order associations: Age-related osteoporosis without current pathological fracture - Plan: DG Bone Density  Return precautions advised.  Garret Reddish, MD

## 2018-05-14 NOTE — Assessment & Plan Note (Signed)
Status post liver transplant  s: Continues to follow with Dr. Loletha Grayer is due to pathology/liver transplant center due to history of hepatic cirrhosis related to alcohol abuse.  She is on tacrolimus for antirejection.  She gets labs monitored regularly by [facility]-largely reassuring- does have intermittent mild hyponatremia.  She sees them each June- scheduled for the 24th A/P: doing well- continue current medications

## 2018-05-14 NOTE — Assessment & Plan Note (Signed)
S: Patient has been compliant with Fosamax once a week- calcium and vitamin D as well.  She is tolerating these medications well.  Recommended bone density sometime in July or later for 2-year follow-up A/P: update dexa

## 2018-05-14 NOTE — Assessment & Plan Note (Signed)
S: Insomnia controlled on Ambien 6.25 mg XR nightly.  She gets 4 to 5 hours of sleep with medicine but does not sleep at all reportedly without medication. States has never been a good sleeper A/P: Continue current medication as long as no falls or confusion

## 2018-05-14 NOTE — Assessment & Plan Note (Signed)
S: Patient has a history of seizures before her liver transplant.  She has had no seizure-like activity on Keppra 250 mg twice daily.  She followed with Dr. Krista Blue but was released to primary care to follow on same regimen A/P: Continue Keppra 250 mg twice daily- this is her long-term plan- neurology did not suggest to wean

## 2018-05-14 NOTE — Patient Instructions (Addendum)
If [facility] is willing-  1. with long term protonix use- would love to have b12 drawn under high risk medication 2. Since they are drawing blood- can they draw a lipid panel for screening purposes though I would not likely start you on medicine 3. I am ok with you stopping aspirin- I want you to double check with [facility] to make sure they are ok with it as well.   Schedule your bone density test at check out desk. You may also call directly to X-ray at [phone] to schedule an appointment that is convenient for you.  - located [address] across the [address] from [address] - in the basement - you do need an appointment for the bone density tests.   No changes today otherwise.

## 2018-05-22 ENCOUNTER — Other Ambulatory Visit: Payer: Self-pay

## 2018-05-22 MED ORDER — PANTOPRAZOLE SODIUM 20 MG PO TBEC: 20.0000 mg | DELAYED_RELEASE_TABLET | Freq: Every day | ORAL | 3 refills | Status: DC

## 2018-05-26 DIAGNOSIS — Z944 Liver transplant status: Secondary | ICD-10-CM | POA: Diagnosis not present

## 2018-05-26 DIAGNOSIS — D899 Disorder involving the immune mechanism, unspecified: Secondary | ICD-10-CM | POA: Diagnosis not present

## 2018-05-26 DIAGNOSIS — D638 Anemia in other chronic diseases classified elsewhere: Secondary | ICD-10-CM | POA: Diagnosis not present

## 2018-05-27 ENCOUNTER — Encounter: Payer: Self-pay | Admitting: Family Medicine

## 2018-05-28 ENCOUNTER — Other Ambulatory Visit: Payer: Self-pay

## 2018-06-19 ENCOUNTER — Ambulatory Visit (INDEPENDENT_AMBULATORY_CARE_PROVIDER_SITE_OTHER)
Admission: RE | Admit: 2018-06-19 | Discharge: 2018-06-19 | Disposition: A | Discharge status: Home / Self Care | Payer: Medicare Other | Source: Ambulatory Visit | Attending: Family Medicine | Admitting: Family Medicine

## 2018-06-19 DIAGNOSIS — M810 Age-related osteoporosis without current pathological fracture: Secondary | ICD-10-CM | POA: Diagnosis not present

## 2018-06-30 NOTE — Progress Notes (Signed)
Cardiology Office Note:    Date:  07/02/2018   ID:  [name], DOB December 09, 1931, MRN [id number]  PCP:  Marin Olp, MD  Cardiologist:  Dr. Jenkins Rouge   Electrophysiologist:  N/a Hepatologist/Live Transplant at [facility]: Dr. Enis Gash Neurologist: Dr. Krista Blue  Referring MD: Marin Olp, MD   No chief complaint on file.   History of Present Illness:    [name] is a 84 y.o. female with a hx of valvular heart disease with mod AI and mod MR, hepatic cirrhosis s/p liver transplant, seizures   She is the primary caregiver for her husband who has dementia.   The patient denies chest pain, shortness of breath, syncope, orthopnea, PND or significant pedal edema.   Echo:  06/10/17 Study Conclusions  - Left ventricle: The cavity size was normal. Wall thickness was   normal. Systolic function was normal. The estimated ejection   fraction was in the range of 55% to 60%. The study is not   technically sufficient to allow evaluation of LV diastolic   function. - Aortic valve: There was moderate regurgitation. - Mitral valve: Calcified annulus. There was moderate   regurgitation. - Left atrium: The appendage was moderately dilated. - Atrial septum: No defect or patent foramen ovale was identified. - Pulmonary arteries: PA peak pressure: 53 mm Hg (S).  Reviewed echo from today and moderate MR/AR with likely moderate AS as well Discussed valvular disease with patient and fact at her age there is not much we Can do about it and the fact that her LV is compensating well and she is asymptomatic  Past Medical History:  Diagnosis Date  . Cirrhosis [facility])    s/p Liver Transplant - follow at [facility]  . History of cardiac catheterization    a. LHC 06/2006: EF 65-70%, pLAD 20%  . History of GI bleed    patient denies  . Mitral valve insufficiency and aortic valve insufficiency    a. Echo 3/14:  Mild LVH, EF 55-65%, Gr 1 DD, moderate AI, MAC, mild mitral stenosis, mild to mod MR,  mod LAE, PASP 34 mmHg //  b. Echo 6/15: Mild LVH, EF 55-60%, Gr 2 DD, mode AI, MAC, trivial mitral stenosis (mean gradient 5 mmHg), mod MR, mod LAE, PASP 30 mmHg  //  c. Echo 6/16: EF 55-60%, no RWMA, Gr 2 DD, mod AI, mildly dilated Asc aorta (38 mm), MAC, mod MS, moderate MR, PASP 31 mmHg   . Osteoporosis   . Seizure disorder [facility])    followed by Neuro    Past Surgical History:  Procedure Laterality Date  . LIVER TRANSPLANT  08/08    Current Medications: Outpatient Medications Prior to Visit  Medication Sig Dispense Refill  . alendronate (FOSAMAX) 70 MG tablet TAKE 1 TABLET BY MOUTH  EVERY 7 DAYS WITH A FULL  GLASS OF WATER ON AN EMPTY  STOMACH 12 tablet 1  . Calcium Carbonate-Vit D-Min (CALCIUM 1200 PO) Take 1 tablet by mouth daily.    Marland Kitchen levETIRAcetam (KEPPRA) 250 MG tablet Take 1 tablet (250 mg total) by mouth 2 (two) times daily. 180 tablet 3  . Multiple Vitamin (MULTIVITAMIN) tablet Take 1 tablet by mouth daily.      . pantoprazole (PROTONIX) 20 MG tablet Take 1 tablet (20 mg total) by mouth daily. 90 tablet 3  . tacrolimus (PROGRAF) 1 MG capsule Take 2 mg by mouth 2 (two) times daily. Take 2 capsules in the morning and 2 capsule a  night    . zolpidem (AMBIEN CR) 6.25 MG CR tablet Take 1 tablet (6.25 mg total) by mouth at bedtime as needed for sleep. 90 tablet 1  . cholecalciferol (VITAMIN D) 1000 UNITS tablet Take 1,000 Units by mouth daily.       No facility-administered medications prior to visit.       Allergies:   Nsaids and Codeine   Social History   Socioeconomic History  . Marital status: Married    Spouse name: [name]   . Number of children: 3  . Years of education: 12th  . Highest education level: Not on file  Occupational History  . Occupation: Retired  Scientific laboratory technician  . Financial resource strain: Not on file  . Food insecurity:    Worry: Not on file    Inability: Not on file  . Transportation needs:    Medical: Not on file    Non-medical: Not on file    Tobacco Use  . Smoking status: Former Research scientist (life sciences)  . Smokeless tobacco: Never Used  Substance and Sexual Activity  . Alcohol use: No    Comment: h/o alcohol abuse  . Drug use: No  . Sexual activity: Not on file  Lifestyle  . Physical activity:    Days per week: Not on file    Minutes per session: Not on file  . Stress: Not on file  Relationships  . Social connections:    Talks on phone: Not on file    Gets together: Not on file    Attends religious service: Not on file    Active member of club or organization: Not on file    Attends meetings of clubs or organizations: Not on file    Relationship status: Not on file  Other Topics Concern  . Not on file  Social History Narrative   Patient lives at home with husband [name].    Daughter lives close and extremely supportive      Patient has 3 children. 4 grandchildren. No greatgrandchildren.    Patient has a high school education level.    Patient is retired from [employer], cared for her children      Hobbies: dancing ([place] singing group)-fewer and fewer get togethers     Family History:  The patient's family history includes Heart attack (age of onset: 20) in her mother.   ROS:   Please see the history of present illness.    Review of Systems  Hematologic/Lymphatic: Bruises/bleeds easily.   All other systems reviewed and are negative.   Physical Exam:    VS:  BP (!) 106/58   Pulse 66   Ht _0  (1.549 m)   Wt 116 lb 4 oz (52.7 kg)   LMP  (LMP Unknown)   SpO2 96%   BMI 21.97 kg/m     Wt Readings from Last 3 Encounters:  07/02/18 116 lb 4 oz (52.7 kg)  05/14/18 114 lb 9.6 oz (52 kg)  04/04/18 114 lb 3.2 oz (51.8 kg)     Affect appropriate Healthy:  appears stated age HEENT: normal Neck supple with no adenopathy JVP normal no bruits no thyromegaly Lungs clear with no wheezing and good diaphragmatic motion Heart:  S1/S2 AR and MR  murmur, no rub, gallop or click PMI normal Abdomen: benighn, BS positve, no  tenderness, no AAA no bruit.  No HSM or HJR Distal pulses intact with no bruits No edema Neuro non-focal Skin warm and dry No muscular weakness   Studies/Labs Reviewed:  EKG:   07/02/18 SR rate 66 RBBB   Recent Labs: No results found for requested labs within last 8760 hours.   Recent Lipid Panel    Component Value Date/Time   CHOL 164 03/03/2014 0907   TRIG 97.0 03/03/2014 0907   HDL 69.10 03/03/2014 0907   CHOLHDL 2 03/03/2014 0907   VLDL 19.4 03/03/2014 0907   LDLCALC 76 03/03/2014 0907    Additional studies/ records that were reviewed today include:   Echo 06/10/17 EF 55-60% Moderate AR Moderate MR Moderate LAE Estimated PA 53 mmHg  LHC 7/07 EF 65-70% LM normal Proximal LAD irregularity 20%  ASSESSMENT:    Valve Disease  PLAN:    In order of problems listed above:  1. AV Disease:  Moderate AR 2. MV Disease:  Moderate MR/mild MS 3. Liver Transplant: followed at [facility] on prograf 4. Seizure: on keppra none since transplant f/u Dr Krista Blue  5. Varicose Veins:  Encouraged use of compression stockings elevate legs latter in day when possible   Jenkins Rouge

## 2018-07-02 ENCOUNTER — Other Ambulatory Visit: Payer: Self-pay | Admitting: Cardiovascular Disease

## 2018-07-02 ENCOUNTER — Encounter: Payer: Self-pay | Admitting: Cardiovascular Disease

## 2018-07-02 ENCOUNTER — Ambulatory Visit (HOSPITAL_COMMUNITY): Payer: Medicare Other | Attending: Cardiovascular Disease

## 2018-07-02 ENCOUNTER — Other Ambulatory Visit: Payer: Self-pay

## 2018-07-02 ENCOUNTER — Ambulatory Visit (INDEPENDENT_AMBULATORY_CARE_PROVIDER_SITE_OTHER): Payer: Medicare Other | Admitting: Cardiovascular Disease

## 2018-07-02 VITALS — BP 106/58 | HR 66 | Ht 61.0 in | Wt 116.2 lb

## 2018-07-02 DIAGNOSIS — I351 Nonrheumatic aortic (valve) insufficiency: Secondary | ICD-10-CM

## 2018-07-02 DIAGNOSIS — I082 Rheumatic disorders of both aortic and tricuspid valves: Secondary | ICD-10-CM | POA: Diagnosis not present

## 2018-07-02 NOTE — Patient Instructions (Addendum)

## 2018-07-28 ENCOUNTER — Encounter: Payer: Self-pay | Admitting: Family Medicine

## 2018-07-29 ENCOUNTER — Encounter: Payer: Self-pay | Admitting: Family Medicine

## 2018-08-05 ENCOUNTER — Other Ambulatory Visit: Payer: Self-pay | Admitting: Family Medicine

## 2018-08-05 ENCOUNTER — Encounter: Payer: Self-pay | Admitting: Family Medicine

## 2018-08-05 MED ORDER — TRAZODONE HCL 50 MG PO TABS: 25.0000 mg | ORAL_TABLET | Freq: Every evening | ORAL | 3 refills | Status: DC | PRN

## 2018-08-13 ENCOUNTER — Telehealth: Payer: Self-pay | Admitting: Family Medicine

## 2018-08-13 NOTE — Telephone Encounter (Signed)
Copied from [place] [id number]. Topic: Quick Communication - See Telephone Encounter >> Aug 13, 2018 11:43 AM Ahmed Prima L wrote: CRM for notification. See Telephone encounter for: 08/13/18.  Shanon Brow with [employer] called and he said that traZODone (DESYREL) 50 MG tablet & tacrolimus (PROGRAF) 1 MG capsule could possibly have a drug interaction together.  Call back @ [phone] reference number [id number]

## 2018-08-14 NOTE — Telephone Encounter (Signed)
What is the drug interaction? Didn't see one in interaction checker on up to date so just needing more info

## 2018-08-14 NOTE — Telephone Encounter (Signed)
Please see message and advise 

## 2018-08-15 NOTE — Telephone Encounter (Signed)
See pt message

## 2018-08-20 ENCOUNTER — Telehealth: Payer: Self-pay

## 2018-08-20 NOTE — Telephone Encounter (Signed)
Left message for patient to call back  

## 2018-08-20 NOTE — Telephone Encounter (Signed)
Patient's daughter called back. Informed her that I was calling to schedule her mother's EKG. Patient still has not received her trazodone. Patient's daughter is going to Mychart when her mother starts taking Trazodone and the day and time she can bring her for the EKG. Will await Mychart message to schedule.

## 2018-08-20 NOTE — Telephone Encounter (Signed)
Follow up: ° ° °Patient returning call  ° ° ° °

## 2018-08-20 NOTE — Telephone Encounter (Signed)
-----   Message from Josue Hector, MD sent at 08/18/2018  6:26 PM EDT ----- Needs ECG in 4 weeks after starting trazadone check QT

## 2018-08-26 DIAGNOSIS — D899 Disorder involving the immune mechanism, unspecified: Secondary | ICD-10-CM | POA: Diagnosis not present

## 2018-08-26 DIAGNOSIS — Z944 Liver transplant status: Secondary | ICD-10-CM | POA: Diagnosis not present

## 2018-09-01 ENCOUNTER — Encounter: Payer: Self-pay | Admitting: Family Medicine

## 2018-09-05 DIAGNOSIS — Z944 Liver transplant status: Secondary | ICD-10-CM | POA: Diagnosis not present

## 2018-09-05 DIAGNOSIS — D899 Disorder involving the immune mechanism, unspecified: Secondary | ICD-10-CM | POA: Diagnosis not present

## 2018-09-12 DIAGNOSIS — D899 Disorder involving the immune mechanism, unspecified: Secondary | ICD-10-CM | POA: Diagnosis not present

## 2018-09-12 DIAGNOSIS — Z940 Kidney transplant status: Secondary | ICD-10-CM | POA: Diagnosis not present

## 2018-09-16 ENCOUNTER — Telehealth: Payer: Self-pay | Admitting: *Deleted

## 2018-09-16 NOTE — Telephone Encounter (Deleted)
Called and left [name] at [facility] health care in [facility] a message. Explained that I called and left the patient a message to call the office back.

## 2018-09-16 NOTE — Telephone Encounter (Deleted)
Attempted to contact the patient regarding a new appt. Left a message to call the office back.

## 2018-09-19 ENCOUNTER — Ambulatory Visit: Payer: Self-pay

## 2018-09-22 DIAGNOSIS — H25013 Cortical age-related cataract, bilateral: Secondary | ICD-10-CM | POA: Diagnosis not present

## 2018-09-22 DIAGNOSIS — H2513 Age-related nuclear cataract, bilateral: Secondary | ICD-10-CM | POA: Diagnosis not present

## 2018-09-22 DIAGNOSIS — H35033 Hypertensive retinopathy, bilateral: Secondary | ICD-10-CM | POA: Diagnosis not present

## 2018-09-22 DIAGNOSIS — H353132 Nonexudative age-related macular degeneration, bilateral, intermediate dry stage: Secondary | ICD-10-CM | POA: Diagnosis not present

## 2018-09-23 DIAGNOSIS — Z944 Liver transplant status: Secondary | ICD-10-CM | POA: Diagnosis not present

## 2018-09-23 DIAGNOSIS — D899 Disorder involving the immune mechanism, unspecified: Secondary | ICD-10-CM | POA: Diagnosis not present

## 2018-09-25 NOTE — Telephone Encounter (Signed)
done

## 2018-09-25 NOTE — Telephone Encounter (Signed)
Open by [employer]

## 2018-09-30 DIAGNOSIS — D899 Disorder involving the immune mechanism, unspecified: Secondary | ICD-10-CM | POA: Diagnosis not present

## 2018-09-30 DIAGNOSIS — Z944 Liver transplant status: Secondary | ICD-10-CM | POA: Diagnosis not present

## 2018-10-14 DIAGNOSIS — D899 Disorder involving the immune mechanism, unspecified: Secondary | ICD-10-CM | POA: Diagnosis not present

## 2018-10-14 DIAGNOSIS — Z944 Liver transplant status: Secondary | ICD-10-CM | POA: Diagnosis not present

## 2018-10-16 ENCOUNTER — Ambulatory Visit (INDEPENDENT_AMBULATORY_CARE_PROVIDER_SITE_OTHER): Payer: Medicare Other | Admitting: *Deleted

## 2018-10-16 ENCOUNTER — Encounter: Payer: Self-pay | Admitting: Family Medicine

## 2018-10-16 DIAGNOSIS — Z23 Encounter for immunization: Secondary | ICD-10-CM

## 2018-11-13 DIAGNOSIS — D899 Disorder involving the immune mechanism, unspecified: Secondary | ICD-10-CM | POA: Diagnosis not present

## 2018-11-13 DIAGNOSIS — Z944 Liver transplant status: Secondary | ICD-10-CM | POA: Diagnosis not present

## 2018-11-20 DIAGNOSIS — D899 Disorder involving the immune mechanism, unspecified: Secondary | ICD-10-CM | POA: Diagnosis not present

## 2018-11-20 DIAGNOSIS — Z944 Liver transplant status: Secondary | ICD-10-CM | POA: Diagnosis not present

## 2018-11-23 ENCOUNTER — Other Ambulatory Visit: Payer: Self-pay | Admitting: Family Medicine

## 2018-12-04 DIAGNOSIS — D899 Disorder involving the immune mechanism, unspecified: Secondary | ICD-10-CM | POA: Diagnosis not present

## 2018-12-04 DIAGNOSIS — Z944 Liver transplant status: Secondary | ICD-10-CM | POA: Diagnosis not present

## 2018-12-18 DIAGNOSIS — D899 Disorder involving the immune mechanism, unspecified: Secondary | ICD-10-CM | POA: Diagnosis not present

## 2018-12-18 DIAGNOSIS — Z944 Liver transplant status: Secondary | ICD-10-CM | POA: Diagnosis not present

## 2019-03-18 ENCOUNTER — Telehealth: Payer: Self-pay | Admitting: Cardiovascular Disease

## 2019-03-18 DIAGNOSIS — I080 Rheumatic disorders of both mitral and aortic valves: Secondary | ICD-10-CM

## 2019-03-18 NOTE — Telephone Encounter (Signed)
Patient's daughter called stating that her mother gets a yearly Echo done she would like to schedule that however there is no order for the Echo.

## 2019-03-19 NOTE — Telephone Encounter (Signed)
Echo can be repeated in 3 months

## 2019-03-19 NOTE — Telephone Encounter (Signed)
Patient's last echo was 07/02/2018. Will forward to Dr. Johnsie Cancel to see if patient needs yearly echo and if echo can wait until after COVID19  precautions are done.

## 2019-03-23 NOTE — Telephone Encounter (Signed)
Order has been placed. Echo has been scheduled for 08/05/19 at 1:00 pm same day as office visit. Will call patient to let her know.

## 2019-03-23 NOTE — Telephone Encounter (Signed)
Per daughter's call appt made for Sept. 2nd with Card. and would like ECHO on same day 11:30a or 1pm schedule please give her a call to do or when done.  Please put Order in.   She said thank you!

## 2019-05-11 ENCOUNTER — Encounter: Payer: Self-pay | Admitting: Family Medicine

## 2019-05-11 NOTE — Telephone Encounter (Signed)
Please advise if okay to do a virtual visit and for pt to have labs drawn at other office.

## 2019-05-19 DIAGNOSIS — Z944 Liver transplant status: Secondary | ICD-10-CM | POA: Diagnosis not present

## 2019-05-19 DIAGNOSIS — D899 Disorder involving the immune mechanism, unspecified: Secondary | ICD-10-CM | POA: Diagnosis not present

## 2019-05-19 LAB — BASIC METABOLIC PANEL
BUN: 14 (ref 4–21)
Creatinine: 1 (ref 0.5–1.1)
Glucose: 119
Potassium: 4.3 (ref 3.4–5.3)
Sodium: 133 — AB (ref 137–147)

## 2019-05-19 LAB — CBC AND DIFFERENTIAL
HCT: 33 — AB (ref 36–46)
Hemoglobin: 11.9 — AB (ref 12.0–16.0)
Neutrophils Absolute: 4
Platelets: 236 (ref 150–399)
WBC: 6.4

## 2019-05-19 LAB — HEPATIC FUNCTION PANEL
ALT: 20 (ref 7–35)
AST: 23 (ref 13–35)
Alkaline Phosphatase: 75 (ref 25–125)
Bilirubin, Total: 0.5

## 2019-05-21 DIAGNOSIS — Z72820 Sleep deprivation: Secondary | ICD-10-CM | POA: Diagnosis not present

## 2019-05-21 DIAGNOSIS — D899 Disorder involving the immune mechanism, unspecified: Secondary | ICD-10-CM | POA: Diagnosis not present

## 2019-05-21 DIAGNOSIS — Z944 Liver transplant status: Secondary | ICD-10-CM | POA: Diagnosis not present

## 2019-05-25 ENCOUNTER — Encounter: Payer: Medicare Other | Admitting: Family Medicine

## 2019-05-27 ENCOUNTER — Encounter: Payer: Self-pay | Admitting: Family Medicine

## 2019-06-02 ENCOUNTER — Encounter: Payer: Self-pay | Admitting: Family Medicine

## 2019-06-02 ENCOUNTER — Ambulatory Visit (INDEPENDENT_AMBULATORY_CARE_PROVIDER_SITE_OTHER): Payer: Medicare Other | Admitting: Family Medicine

## 2019-06-02 VITALS — Ht 61.5 in | Wt 114.0 lb

## 2019-06-02 DIAGNOSIS — Z944 Liver transplant status: Secondary | ICD-10-CM

## 2019-06-02 DIAGNOSIS — D899 Disorder involving the immune mechanism, unspecified: Secondary | ICD-10-CM

## 2019-06-02 DIAGNOSIS — N183 Chronic kidney disease, stage 3 unspecified: Secondary | ICD-10-CM

## 2019-06-02 DIAGNOSIS — G40909 Epilepsy, unspecified, not intractable, without status epilepticus: Secondary | ICD-10-CM | POA: Diagnosis not present

## 2019-06-02 DIAGNOSIS — F5101 Primary insomnia: Secondary | ICD-10-CM

## 2019-06-02 DIAGNOSIS — K7460 Unspecified cirrhosis of liver: Secondary | ICD-10-CM

## 2019-06-02 DIAGNOSIS — Z0001 Encounter for general adult medical examination with abnormal findings: Secondary | ICD-10-CM

## 2019-06-02 DIAGNOSIS — Z0000 Encounter for general adult medical examination without abnormal findings: Secondary | ICD-10-CM

## 2019-06-02 DIAGNOSIS — K219 Gastro-esophageal reflux disease without esophagitis: Secondary | ICD-10-CM

## 2019-06-02 DIAGNOSIS — D849 Immunodeficiency, unspecified: Secondary | ICD-10-CM | POA: Insufficient documentation

## 2019-06-02 DIAGNOSIS — E871 Hypo-osmolality and hyponatremia: Secondary | ICD-10-CM

## 2019-06-02 DIAGNOSIS — M810 Age-related osteoporosis without current pathological fracture: Secondary | ICD-10-CM

## 2019-06-02 MED ORDER — ZOLPIDEM TARTRATE ER 6.25 MG PO TBCR: 6.2500 mg | EXTENDED_RELEASE_TABLET | Freq: Every evening | ORAL | 5 refills | Status: DC | PRN

## 2019-06-02 NOTE — Progress Notes (Signed)
Phone: [phone]   Subjective:  Patient presents today for their annual physical. Chief complaint-noted.   See problem oriented charting- ROS- full  review of systems was completed and negative except for: insomnia. covid 19 screening- No fever, chills, cough, congestion, runny nose, shortness of breath, new fatigue (other than related to insomnia), body aches, sore throat, headache, nausea, vomiting, diarrhea, or new loss of taste or smell. No known contacts with covid 19 or someone being tested for covid 19.   The following were reviewed and entered/updated in epic: Past Medical History:  Diagnosis Date  . Cirrhosis [facility])    s/p Liver Transplant - follow at [facility]  . History of cardiac catheterization    a. LHC 06/2006: EF 65-70%, pLAD 20%  . History of GI bleed    patient denies  . Mitral valve insufficiency and aortic valve insufficiency    a. Echo 3/14:  Mild LVH, EF 55-65%, Gr 1 DD, moderate AI, MAC, mild mitral stenosis, mild to mod MR, mod LAE, PASP 34 mmHg //  b. Echo 6/15: Mild LVH, EF 55-60%, Gr 2 DD, mode AI, MAC, trivial mitral stenosis (mean gradient 5 mmHg), mod MR, mod LAE, PASP 30 mmHg  //  c. Echo 6/16: EF 55-60%, no RWMA, Gr 2 DD, mod AI, mildly dilated Asc aorta (38 mm), MAC, mod MS, moderate MR, PASP 31 mmHg   . Osteoporosis   . Seizure disorder [facility])    followed by Neuro   Patient Active Problem List   Diagnosis Date Noted  . Seizure disorder ([place]) 09/21/2013    Priority: High  . Liver replaced by transplant ([place]) 06/08/2010    Priority: High  . Hepatic cirrhosis ([place]) 06/09/2007    Priority: High  . CKD (chronic kidney disease), stage III ([place]) 05/03/2017    Priority: Medium  . Mitral valve insufficiency and aortic valve insufficiency     Priority: Medium  . Hyponatremia 05/17/2014    Priority: Medium  . Aortic insufficiency 12/03/2012    Priority: Medium  . Insomnia 06/08/2010    Priority: Medium  . Osteoporosis 06/09/2007    Priority: Medium  .  History of skin cancer 05/03/2017    Priority: Low  . GERD (gastroesophageal reflux disease) 12/13/2014    Priority: Low  . Dizziness 05/17/2014    Priority: Low   Past Surgical History:  Procedure Laterality Date  . LIVER TRANSPLANT  08/08    Family History  Problem Relation Age of Onset  . Heart attack Mother 42    Medications- reviewed and updated Current Outpatient Medications  Medication Sig Dispense Refill  . alendronate (FOSAMAX) 70 MG tablet TAKE 1 TABLET BY MOUTH  EVERY 7 DAYS WITH A FULL  GLASS OF WATER ON AN EMPTY  STOMACH 12 tablet 1  . Calcium Carbonate-Vit D-Min (CALCIUM 1200 PO) Take 1 tablet by mouth daily.    . Calcium Citrate-Vitamin D (CALCIUM + D PO) Take by mouth.    . Cyanocobalamin (B-12 PO) Take by mouth.    . levETIRAcetam (KEPPRA) 250 MG tablet TAKE 1 TABLET BY MOUTH TWO  TIMES DAILY 180 tablet 3  . MAGNESIUM PO Take by mouth.    . Multiple Vitamin (MULTIVITAMIN) tablet Take 1 tablet by mouth daily.      . Pyridoxine HCl (B-6 PO) Take by mouth.    . tacrolimus (PROGRAF) 1 MG capsule Take 2 mg by mouth 2 (two) times daily. Take 2 capsules in the morning and 2 capsule a night    .  zolpidem (AMBIEN CR) 6.25 MG CR tablet Take 1 tablet (6.25 mg total) by mouth at bedtime as needed for sleep. 30 tablet 5   No current facility-administered medications for this visit.     Allergies-reviewed and updated Allergies  Allergen Reactions  . Nsaids Other (See Comments)    Kidney transplant precautions   . Codeine Rash    Social History   Social History Narrative   Patient lives at home with husband [name].    Daughter lives close and extremely supportive      Patient has 3 children. 4 grandchildren. No greatgrandchildren.    Patient has a high school education level.    Patient is retired from [employer], cared for her children      Hobbies: dancing ([place] singing group)-fewer and fewer get togethers   Objective  Objective:  Ht 5' 1.5" (1.562 m)    Wt 114 lb (51.7 kg)   LMP  (LMP Unknown)   BMI 21.19 kg/m  Gen: NAD, resting comfortably on her couch at home HEENT: Mucous membranes are moist.  External nose and ears normal Neck: no visible thyromegaly or cervical lymphadenopathy Lungs: Normal respiratory rate, nonlabored breathing Abdomen: nondistended/not obese Ext: no edema on upper extremities noted Skin: warm, dry, no jaundice noted Neuro: grossly normal, normal speech and upper extremity movements   Assessment and Plan    85 y.o. female presenting for annual physical.  Health Maintenance counseling: 1. Anticipatory guidance: Patient counseled regarding regular dental exams - no teeth- dentures didn't work well, eye exams - yearly,  avoiding smoking and second hand smoke , limiting alcohol to 1 beverage per day- avoids due to liver .   2. Risk factor reduction:  Advised patient of need for regular exercise and diet rich and fruits and vegetables to reduce risk of heart attack and stroke. Exercise- walks around her apartment complex daily actually twice daily. Diet-weight has been stable- reasonable diet- daughter trying to get her to eat more.  Wt Readings from Last 3 Encounters:  06/02/19 114 lb (51.7 kg)  07/02/18 116 lb 4 oz (52.7 kg)  05/14/18 114 lb 9.6 oz (52 kg)  3. Immunizations/screenings/ancillary studies- she is going to ask her transplant docs if she can take shingrix- for now will hold off given overlap of side effects with covid 19 Immunization History  Administered Date(s) Administered  . Influenza Split 11/07/2011, 11/05/2012  . Influenza Whole 09/14/2008, 09/19/2009, 10/12/2010  . Influenza, High Dose Seasonal PF 09/23/2013, 11/19/2016, 11/18/2017, 10/16/2018  . Influenza,inj,Quad PF,6+ Mos 10/11/2014, 11/16/2015  . Pneumococcal Conjugate-13 05/02/2016  . Pneumococcal Polysaccharide-23 12/04/2003  . Tdap 04/29/2014  4. Cervical cancer screening- passed age based screening 5. Breast cancer screening-  Passed  age based screening- stopped in 2011 6. Colon cancer screening - last colonoscopy 2011- passed age based screening 7. Skin cancer screening- follows with dermatology- had one spot removed and needed 16 sutures- was skin cancer. advised regular sunscreen use. Denies worrisome, changing, or new skin lesions.  8. Birth control/ monitoring- on calcium/vitamin d/fosamax- needs repeat scan 06/2020 or later 10. former smoker-quit smoking 16 years ago  Status of chronic or acute concerns   Insomnia- when patient switched to new insurance after her husband died- did not have ambien 6.25 mg XR on fomulary. We started trazodone-but unfortunately liver enzymes went up. Also tried valaerian root which raised enzymes. Taking zquill occasionally. Some nights does not sleep at all and other nights sleeping 1-2 hours even with melatonin. Has reasonable sleep  hygeine - we are going to try [facility] ER again 6.25 mg as long as no falls. Patient does not drive. Will use goodrx to help with cost  GERD stopped pantoprazole 36m- history of varices years ago per daughter and also with history of gastric ulcer I believe- no recent issues and [facility] was ok with stopping  Patient with history of hepatic cirrhosis status post liver transplant from prior alcohol abuse.  Transplant was at age 85  Patient remains on tacrolimus.  She continues to follow-up with [facility] yearly as well as get labs every 3 months-we reviewed her recent labs from [facility]  She would prefer to not come in for labs to check lipid panel and would be unlikely to start statin at her age anyway- thought reasonable in light of COVID-19.  She is immunosuppressed on tacrolimus-stable without signs of infection.  Seizure disorder-patient remains on long-term Keppra without seizure in several years.  Has followed with Dr. YKrista Bluein the past.  [facility]  Has also been noted to have mild anemia with high MCV- if she is agreeable in the future  would be reasonable to get B12 and folate level.  Osteoporosis-remains on alendronate/calcium/vitamin D-will be due for repeat scan July 2021 or later  [facility] stage [facility].  Filtration rate typically in the 50s.  1 year physical Future Appointments  Date Time Provider DLatty 08/05/2019 12:50 PM [id number]  08/05/2019  2:00 PM NJosue Hector MD [facility]   Lab/Order associations: No labs will be completed at this time   ICD-10-CM   1. Preventative health care  Z00.00   2. Seizure disorder ([facility]  G40.909   3. CKD (chronic kidney disease), stage III ([facility])  N18.3   4. Cirrhosis of liver without ascites, unspecified hepatic cirrhosis type ([place]  K74.60   5. Liver replaced by transplant ([facility]  Z94.4   6. Primary insomnia  F51.01   7. Hyponatremia  E87.1   8. Age-related osteoporosis without current pathological fracture  M81.0     Meds ordered this encounter  Medications  . zolpidem (AMBIEN CR) 6.25 MG CR tablet    Sig: Take 1 tablet (6.25 mg total) by mouth at bedtime as needed for sleep.    Dispense:  30 tablet    Refill:  5    Run through good rx not through her insurance    Return precautions advised.  SGarret Reddish MD

## 2019-06-02 NOTE — Assessment & Plan Note (Signed)
Insomnia- when patient switched to new insurance after her husband died- did not have ambien 6.25 mg XR on fomulary. We started trazodone-but unfortunately liver enzymes went up. Also tried valaerian root which raised enzymes. Taking zquill occasionally. Some nights does not sleep at all and other nights sleeping 1-2 hours even with melatonin. Has reasonable sleep hygeine - we are going to try [facility] ER again 6.25 mg as long as no falls. Patient does not drive. Will use goodrx to help with cost

## 2019-06-02 NOTE — Patient Instructions (Addendum)
There are no preventive care reminders to display for this patient.  Depression screen [facility] 2/9 06/02/2019 05/14/2018 05/03/2017  Decreased Interest 0 2 0  Down, Depressed, Hopeless 0 0 0  PHQ - 2 Score 0 2 0  Altered sleeping 3 0 -  Tired, decreased energy 1 1 -  Change in appetite 0 0 -  Feeling bad or failure about yourself  0 0 -  Trouble concentrating 0 0 -  Moving slowly or fidgety/restless 0 0 -  Suicidal thoughts 0 0 -  PHQ-9 Score 4 3 -  Difficult doing work/chores Not difficult at all Not difficult at all -

## 2019-06-02 NOTE — Assessment & Plan Note (Signed)
GERD stopped pantoprazole 20mg - history of varices years ago per daughter and also with history of gastric ulcer I believe- no recent issues and [facility] was [facility] with stopping

## 2019-07-23 NOTE — Progress Notes (Signed)
Cardiology Office Note:    Date:  08/05/2019   ID:  [name], DOB 12-08-1931, MRN [id number]  PCP:  Marin Olp, MD  Cardiologist:  Dr. Jenkins Rouge   Electrophysiologist:  N/a Hepatologist/Live Transplant at [facility]: Dr. Enis Gash Neurologist: Dr. Krista Blue  Referring MD: Marin Olp, MD   No chief complaint on file.   History of Present Illness:    [name] is a 85 y.o. 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   Echo 08/05/19 reviewed: MR now severe with mild MS/AR  She is not symptomatic Due to severe calcium and MAC not a mitral clip candidate Long discussion about progression of valve disease She lives Independently in apartment  Still walks 2x/day but no longer dancing to [place] Daughter and son look in on her Not driving   Also discussed risk of deterioration due to afib. Surprisingly she is in sinus despite age , severe LAE and severe MR. Could consider addition of lasix and beta blocker if she were to have afib. She is euvolemic at this time and for her age functional class one   Past Medical History:  Diagnosis Date  . Cirrhosis [facility])    s/p Liver Transplant - follow at [facility]  . History of cardiac catheterization    a. LHC 06/2006: EF 65-70%, pLAD 20%  . History of GI bleed    patient denies  . Mitral valve insufficiency and aortic valve insufficiency    a. Echo 3/14:  Mild LVH, EF 55-65%, Gr 1 DD, moderate AI, MAC, mild mitral stenosis, mild to mod MR, mod LAE, PASP 34 mmHg //  b. Echo 6/15: Mild LVH, EF 55-60%, Gr 2 DD, mode AI, MAC, trivial mitral stenosis (mean gradient 5 mmHg), mod MR, mod LAE, PASP 30 mmHg  //  c. Echo 6/16: EF 55-60%, no RWMA, Gr 2 DD, mod AI, mildly dilated Asc aorta (38 mm), MAC, mod MS,  moderate MR, PASP 31 mmHg   . Osteoporosis   . Seizure disorder [facility])    followed by Neuro    Past Surgical History:  Procedure Laterality Date  . LIVER TRANSPLANT  08/08    Current Medications: Outpatient Medications Prior to Visit  Medication Sig Dispense Refill  . alendronate (FOSAMAX) 70 MG tablet TAKE 1 TABLET BY MOUTH  EVERY 7 DAYS WITH A FULL  GLASS OF WATER ON AN EMPTY  STOMACH 12 tablet 1  . Calcium Carbonate-Vit D-Min (CALCIUM 1200 PO) Take 1 tablet by mouth daily.    . Calcium Citrate-Vitamin D (CALCIUM + D PO) Take by mouth.    . Cyanocobalamin (B-12 PO) Take by mouth.    . levETIRAcetam (KEPPRA) 250 MG tablet TAKE 1 TABLET BY MOUTH TWO  TIMES DAILY 180 tablet 3  . MAGNESIUM PO Take by mouth.    . Multiple Vitamin (MULTIVITAMIN) tablet Take 1 tablet by mouth daily.      . Pyridoxine HCl (B-6 PO) Take by mouth.    . tacrolimus (PROGRAF) 1 MG capsule Take 2 mg by mouth 2 (two) times daily. Take 2 capsules in the morning and 2 capsule a night    . zolpidem (AMBIEN CR) 6.25 MG CR tablet Take 1 tablet (6.25 mg total) by mouth  at bedtime as needed for sleep. 30 tablet 5   No facility-administered medications prior to visit.       Allergies:   Nsaids and Codeine   Social History   Socioeconomic History  . Marital status: Married    Spouse name: [name]   . Number of children: 3  . Years of education: 12th  . Highest education level: Not on file  Occupational History  . Occupation: Retired  Scientific laboratory technician  . Financial resource strain: Not on file  . Food insecurity    Worry: Not on file    Inability: Not on file  . Transportation needs    Medical: Not on file    Non-medical: Not on file  Tobacco Use  . Smoking status: Former Research scientist (life sciences)  . Smokeless tobacco: Never Used  Substance and Sexual Activity  . Alcohol use: No    Comment: h/o alcohol abuse  . Drug use: No  . Sexual activity: Not on file  Lifestyle  . Physical activity    Days per week: Not on file     Minutes per session: Not on file  . Stress: Not on file  Relationships  . Social Herbalist on phone: Not on file    Gets together: Not on file    Attends religious service: Not on file    Active member of club or organization: Not on file    Attends meetings of clubs or organizations: Not on file    Relationship status: Not on file  Other Topics Concern  . Not on file  Social History Narrative   Patient lives at home with husband [name].    Daughter lives close and extremely supportive      Patient has 3 children. 4 grandchildren. No greatgrandchildren.    Patient has a high school education level.    Patient is retired from [employer], cared for her children      Hobbies: dancing ([place] singing group)-fewer and fewer get togethers     Family History:  The patient's family history includes Heart attack (age of onset: 19) in her mother.   ROS:   Please see the history of present illness.    Review of Systems  Hematologic/Lymphatic: Bruises/bleeds easily.   All other systems reviewed and are negative.   Physical Exam:    VS:  BP (!) 146/60   Pulse 72   Ht 5' 1.5" (1.562 m)   Wt 114 lb (51.7 kg)   LMP  (LMP Unknown)   SpO2 96%   BMI 21.19 kg/m     Wt Readings from Last 3 Encounters:  08/05/19 114 lb (51.7 kg)  06/02/19 114 lb (51.7 kg)  Jul 28, 2018 116 lb 4 oz (52.7 kg)     Affect appropriate Healthy:  appears stated age HEENT: normal Neck supple with no adenopathy JVP normal no bruits no thyromegaly Lungs clear with no wheezing and good diaphragmatic motion Heart:  S1/S2 AR and MR  murmur, no rub, gallop or click PMI normal Abdomen: benighn, BS positve, no tenderness, no AAA no bruit.  No HSM or HJR Distal pulses intact with no bruits No edema Neuro non-focal Skin warm and dry No muscular weakness   Studies/Labs Reviewed:    EKG:   2018/07/28 SR rate 66 RBBB 08/05/19 SR RBBB old rate 72 stable   Recent Labs: 05/19/2019: ALT 20; BUN 14;  Creatinine 1.0; Hemoglobin 11.9; Platelets 236; Potassium 4.3; Sodium 133   Recent Lipid Panel  Component Value Date/Time   CHOL 164 03/03/2014 0907   TRIG 97.0 03/03/2014 0907   HDL 69.10 03/03/2014 0907   CHOLHDL 2 03/03/2014 0907   VLDL 19.4 03/03/2014 0907   LDLCALC 76 03/03/2014 0907    Additional studies/ records that were reviewed today include:   Echo 07/02/18 EF 60-65% normal size moderate AR Moderate MS/MR severe LAE estimated PA pressure 38 mmHg  LHC 7/07 EF 65-70% LM normal Proximal LAD irregularity 20%  ASSESSMENT:    Valve Disease  PLAN:    In order of problems listed above:  1. AV Disease:  Stable mild AR AS 2. MV Disease: severe MR not candidate for mitral clip observe  Risk of PAF discussed  3. Liver Transplant: followed at [facility] on prograf 4. Seizure: on keppra none since transplant f/u Dr Krista Blue  5. Varicose Veins:  Encouraged use of compression stockings elevate legs latter in day when possible 6. RBBB:  Chronic no high grade AV block   F/U with Dr Yong Channel to get flu shot  F/U in 6 months or earlier if pulse becomes irregular/rapid   Jenkins Rouge

## 2019-07-31 ENCOUNTER — Telehealth: Payer: Self-pay | Admitting: Cardiovascular Disease

## 2019-07-31 NOTE — Telephone Encounter (Signed)
Patient's daughter would like to come with mother to her office visit on 9/2

## 2019-08-03 NOTE — Telephone Encounter (Signed)
Patient and daughter were advised through MyChart that daughter may accompany patient to appointment

## 2019-08-05 ENCOUNTER — Other Ambulatory Visit: Payer: Self-pay

## 2019-08-05 ENCOUNTER — Encounter: Payer: Self-pay | Admitting: Cardiovascular Disease

## 2019-08-05 ENCOUNTER — Ambulatory Visit (INDEPENDENT_AMBULATORY_CARE_PROVIDER_SITE_OTHER): Payer: Medicare Other | Admitting: Cardiovascular Disease

## 2019-08-05 ENCOUNTER — Ambulatory Visit (HOSPITAL_COMMUNITY): Payer: Medicare Other | Attending: Cardiovascular Disease

## 2019-08-05 VITALS — BP 146/60 | HR 72 | Ht 61.5 in | Wt 114.0 lb

## 2019-08-05 DIAGNOSIS — I080 Rheumatic disorders of both mitral and aortic valves: Secondary | ICD-10-CM

## 2019-08-05 DIAGNOSIS — I351 Nonrheumatic aortic (valve) insufficiency: Secondary | ICD-10-CM

## 2019-08-05 NOTE — Patient Instructions (Addendum)
Your physician recommends that you continue on your current medications as directed. Please refer to the Current Medication list given to you today.   Your physician wants you to follow-up in:  6 MONTHS WITH DR NISHAN  You will receive a reminder letter in the mail two months in advance. If you don't receive a letter, please call our office to schedule the follow-up appointment. 

## 2019-08-13 ENCOUNTER — Telehealth: Payer: Self-pay

## 2019-08-13 ENCOUNTER — Encounter: Payer: Self-pay | Admitting: Family Medicine

## 2019-08-13 DIAGNOSIS — R42 Dizziness and giddiness: Secondary | ICD-10-CM

## 2019-08-13 MED ORDER — CARVEDILOL 3.125 MG PO TABS: 3.1250 mg | ORAL_TABLET | Freq: Two times a day (BID) | ORAL | 3 refills | Status: DC

## 2019-08-13 NOTE — Telephone Encounter (Signed)
I spoke to the patient's daughter [name]) [phone] and shared Dr Kyla Balzarine recommendations.  Carvedilol 3.125 mg bid and 14 day Zio Patch.

## 2019-08-13 NOTE — Telephone Encounter (Signed)
lpmtcb 9/10

## 2019-08-18 ENCOUNTER — Telehealth: Payer: Self-pay | Admitting: *Deleted

## 2019-08-18 NOTE — Telephone Encounter (Signed)
14 day ZIO XT long term holter monitor to be mailed to the patients home.  Instructions reviewed briefly as they are included in the monitor kit. 

## 2019-08-27 ENCOUNTER — Telehealth: Payer: Self-pay

## 2019-08-27 ENCOUNTER — Other Ambulatory Visit: Payer: Self-pay | Admitting: Family Medicine

## 2019-08-27 NOTE — Telephone Encounter (Signed)
Received MyChart message from Lindaann Ying, patient's daughter [facility]) that event monitor has not yet been receivd. Documentation shows Zio order placed 08/18/19. According to [employer]PS tracking it is in transit , arriving late August 25, 2019 (your package will arrive later than expected , but it still on its way. It is currently in transit to the next facility. Tracking # [id number]    Phoned [name] and informed that device has been shipped/is in transit but there is a shipping delay. Asked her to call our office if not received by early next week. She agrees/verbalizes undertstanding.

## 2019-08-27 NOTE — Telephone Encounter (Addendum)
Tried to call patient to set up appointment. No answer, will try again later.   Patient needs f/u w/Nishan or APP after monitor

## 2019-08-29 ENCOUNTER — Ambulatory Visit (INDEPENDENT_AMBULATORY_CARE_PROVIDER_SITE_OTHER): Payer: Medicare Other

## 2019-08-29 DIAGNOSIS — R42 Dizziness and giddiness: Secondary | ICD-10-CM

## 2019-09-01 ENCOUNTER — Ambulatory Visit (INDEPENDENT_AMBULATORY_CARE_PROVIDER_SITE_OTHER): Payer: Medicare Other

## 2019-09-01 ENCOUNTER — Other Ambulatory Visit: Payer: Self-pay

## 2019-09-01 DIAGNOSIS — Z23 Encounter for immunization: Secondary | ICD-10-CM

## 2019-09-21 DIAGNOSIS — D849 Immunodeficiency, unspecified: Secondary | ICD-10-CM | POA: Diagnosis not present

## 2019-09-21 DIAGNOSIS — Z944 Liver transplant status: Secondary | ICD-10-CM | POA: Diagnosis not present

## 2019-09-25 DIAGNOSIS — R42 Dizziness and giddiness: Secondary | ICD-10-CM | POA: Diagnosis not present

## 2019-09-28 NOTE — Telephone Encounter (Signed)
Result Notes for LONG TERM MONITOR (3-14 DAYS)  Notes recorded by Josue Hector, MD on 09/28/2019 at 5:08 PM EDT  No afib good continue beta blocker   Tried to call Daughter (DPR) to give her results. Will put results on MyChart.

## 2019-10-10 ENCOUNTER — Other Ambulatory Visit: Payer: Self-pay | Admitting: Family Medicine

## 2019-10-20 DIAGNOSIS — Z944 Liver transplant status: Secondary | ICD-10-CM | POA: Diagnosis not present

## 2019-10-20 DIAGNOSIS — D849 Immunodeficiency, unspecified: Secondary | ICD-10-CM | POA: Diagnosis not present

## 2019-10-20 NOTE — Telephone Encounter (Signed)
Updated patients medication list.

## 2019-11-22 ENCOUNTER — Other Ambulatory Visit: Payer: Self-pay | Admitting: Family Medicine

## 2019-11-23 NOTE — Telephone Encounter (Signed)
LAST APPOINTMENT DATE: @6 /30/2020  NEXT APPOINTMENT DATE:@no  app    LAST REFILL:06/02/19  QTY: 30 5 rf

## 2019-11-24 DIAGNOSIS — D849 Immunodeficiency, unspecified: Secondary | ICD-10-CM | POA: Diagnosis not present

## 2019-11-24 DIAGNOSIS — Z944 Liver transplant status: Secondary | ICD-10-CM | POA: Diagnosis not present

## 2020-02-01 ENCOUNTER — Ambulatory Visit: Payer: Medicare Other | Admitting: Cardiovascular Disease

## 2020-02-22 DIAGNOSIS — D849 Immunodeficiency, unspecified: Secondary | ICD-10-CM | POA: Diagnosis not present

## 2020-02-22 DIAGNOSIS — Z944 Liver transplant status: Secondary | ICD-10-CM | POA: Diagnosis not present

## 2020-02-23 ENCOUNTER — Telehealth: Payer: Self-pay | Admitting: Cardiovascular Disease

## 2020-02-23 NOTE — Progress Notes (Signed)
Cardiology Office Note:    Date:  02/26/2020   ID:  [name], DOB [birth year], MRN [id number]  PCP:  Gina Olp, MD  Cardiologist:  Dr. Jenkins Rouge   Electrophysiologist:  N/a Hepatologist/Live Transplant at [facility]: Dr. Enis Gash Neurologist: Dr. Krista Blue  Referring MD: Gina Olp, MD   No chief complaint on file.   History of Present Illness:    [name] is a 86 y.o. female with a hx of valvular heart disease with mild AR/AS and severe MR  , hepatic cirrhosis s/p liver transplant, seizures   Husband passed in 2020  The patient denies chest pain, shortness of breath, syncope, orthopnea, PND or significant pedal edema.   Echo 08/05/19 reviewed: MR now severe with mild AS/AR   She is not symptomatic Due to severe calcium and MAC not a mitral clip candidate Long discussion about progression of valve disease She lives Independently in apartment  Still walks 2x/day but no longer dancing to [place] Daughter and son look in on her Not driving   Also discussed risk of deterioration due to afib. Surprisingly she is in sinus despite age , severe LAE and severe MR. Could consider addition of lasix and beta blocker if she were to have afib. She is euvolemic at this time and for her age functional class one Monitor done 09/28/19 showed no PAF  She has not done much since COVID. She / daughter don't want vaccine due to immunosuppression for liver transplant  Past Medical History:  Diagnosis Date  . Cirrhosis [facility])    s/p Liver Transplant - follow at [facility]  . History of cardiac catheterization    a. LHC 06/2006: EF 65-70%, pLAD 20%  . History of GI bleed    patient denies  . Mitral valve insufficiency and aortic valve insufficiency    a. Echo 3/14:  Mild LVH, EF 55-65%, Gr 1 DD, moderate AI, MAC, mild mitral stenosis, mild to mod MR, mod LAE, PASP 34 mmHg //  b. Echo 6/15: Mild LVH, EF 55-60%, Gr 2 DD, mode AI, MAC, trivial mitral stenosis (mean gradient 5 mmHg), mod MR,  mod LAE, PASP 30 mmHg  //  c. Echo 6/16: EF 55-60%, no RWMA, Gr 2 DD, mod AI, mildly dilated Asc aorta (38 mm), MAC, mod MS, moderate MR, PASP 31 mmHg   . Osteoporosis   . Seizure disorder [facility])    followed by Neuro    Past Surgical History:  Procedure Laterality Date  . LIVER TRANSPLANT  08/08    Current Medications: Outpatient Medications Prior to Visit  Medication Sig Dispense Refill  . alendronate (FOSAMAX) 70 MG tablet TAKE 1 TABLET BY MOUTH  EVERY 7 DAYS WITH A FULL  GLASS OF WATER ON AN EMPTY  STOMACH 12 tablet 3  . Biotin 5 MG CAPS Take 5 mg by mouth daily.    . Calcium Carbonate-Vit D-Min (CALCIUM 1200 PO) Take 1 tablet by mouth daily.    . Cyanocobalamin (B-12 PO) Take by mouth.    . levETIRAcetam (KEPPRA) 250 MG tablet TAKE 1 TABLET BY MOUTH TWO  TIMES DAILY 180 tablet 3  . MAGNESIUM PO Take by mouth.    . Multiple Vitamin (MULTIVITAMIN) tablet Take 1 tablet by mouth daily.      . tacrolimus (PROGRAF) 1 MG capsule Take 2 mg by mouth 2 (two) times daily. Take 2 capsules in the morning and 2 capsule a night    . zolpidem (AMBIEN CR) 6.25 MG  CR tablet TAKE 1 TABLET BY MOUTH AT BEDTIME AS NEEDED FOR SLEEP 30 tablet 4  . Calcium Citrate-Vitamin D (CALCIUM + D PO) Take by mouth.    . Pyridoxine HCl (B-6 PO) Take by mouth.     No facility-administered medications prior to visit.      Allergies:   Nsaids and Codeine   Social History   Socioeconomic History  . Marital status: Widowed    Spouse name: [name]   . Number of children: 3  . Years of education: 12th  . Highest education level: Not on file  Occupational History  . Occupation: Retired  Tobacco Use  . Smoking status: Former Research scientist (life sciences)  . Smokeless tobacco: Never Used  Substance and Sexual Activity  . Alcohol use: No    Comment: h/o alcohol abuse  . Drug use: No  . Sexual activity: Not on file  Other Topics Concern  . Not on file  Social History Narrative   Patient lives at home with husband [name].     Daughter lives close and extremely supportive      Patient has 3 children. 4 grandchildren. No greatgrandchildren.    Patient has a high school education level.    Patient is retired from [employer], cared for her children      Hobbies: dancing ([place] singing group)-fewer and fewer get togethers   Social Determinants of Radio broadcast assistant Strain:   . Difficulty of Paying Living Expenses:   Food Insecurity:   . Worried About Charity fundraiser in the Last Year:   . Arboriculturist in the Last Year:   Transportation Needs:   . Film/video editor (Medical):   Marland Kitchen Lack of Transportation (Non-Medical):   Physical Activity:   . Days of Exercise per Week:   . Minutes of Exercise per Session:   Stress:   . Feeling of Stress :   Social Connections:   . Frequency of Communication with Friends and Family:   . Frequency of Social Gatherings with Friends and Family:   . Attends Religious Services:   . Active Member of Clubs or Organizations:   . Attends Archivist Meetings:   Marland Kitchen Marital Status:      Family History:  The patient's family history includes Heart attack (age of onset: 64) in her mother.   ROS:   Please see the history of present illness.    Review of Systems  Hematologic/Lymphatic: Bruises/bleeds easily.   All other systems reviewed and are negative.   Physical Exam:    VS:  BP 110/64   Pulse 65   Ht _0  (1.549 m)   Wt 112 lb (50.8 kg)   LMP  (LMP Unknown)   SpO2 93%   BMI 21.16 kg/m     Wt Readings from Last 3 Encounters:  02/26/20 112 lb (50.8 kg)  08/05/19 114 lb (51.7 kg)  06/02/19 114 lb (51.7 kg)     Affect appropriate Healthy:  appears stated age HEENT: normal Neck supple with no adenopathy JVP normal no bruits no thyromegaly Lungs clear with no wheezing and good diaphragmatic motion Heart:  S1/S2 AR and MR  murmur, no rub, gallop or click PMI normal Abdomen: benighn, BS positve, no tenderness, no AAA no bruit.  No HSM  or HJR Distal pulses intact with no bruits No edema Neuro non-focal Skin warm and dry No muscular weakness   Studies/Labs Reviewed:    EKG:   17-Jul-2018 SR rate  66 RBBB 08/05/19 SR RBBB old rate 72 stable   Recent Labs: 05/19/2019: ALT 20; BUN 14; Creatinine 1.0; Hemoglobin 11.9; Platelets 236; Potassium 4.3; Sodium 133   Recent Lipid Panel    Component Value Date/Time   CHOL 164 03/03/2014 0907   TRIG 97.0 03/03/2014 0907   HDL 69.10 03/03/2014 0907   CHOLHDL 2 03/03/2014 0907   VLDL 19.4 03/03/2014 0907   LDLCALC 76 03/03/2014 0907    Additional studies/ records that were reviewed today include:   Echo 08/05/19  Normal EF mild AS/AR severe MR  LHC 7/07 EF 65-70% LM normal Proximal LAD irregularity 20%  Long Term monitor 09/28/19  SR average HR 72 rare PAC/PVC < 1%  Short runs atrial tachycardia longest only 13 beats No PaF   ASSESSMENT:    Severe MR   PLAN:    In order of problems listed above:  1. AV Disease:  Stable mild AR AS by TTE 08/29/19  2. MV Disease: severe MR not candidate for mitral clip observe  Risk of PAF discussed  3. Liver Transplant: followed at [facility] on prograf 4. Seizure: on keppra none since transplant f/u Dr Krista Blue  5. Varicose Veins:  Encouraged use of compression stockings elevate legs latter in day when possible 6. RBBB:  Chronic no high grade AV block   F/U with cardiology in a 6 months    Jenkins Rouge

## 2020-02-23 NOTE — Telephone Encounter (Signed)
See my chart message

## 2020-02-23 NOTE — Telephone Encounter (Signed)
Lyndel Pleasure the patient's daughter is calling requesting she attend Evalynne's upcoming appointment scheduled for 02/26/20 due to [facility] having cognitive issues and Lyndel Pleasure being her power of attorney. Please advise.

## 2020-02-26 ENCOUNTER — Ambulatory Visit (INDEPENDENT_AMBULATORY_CARE_PROVIDER_SITE_OTHER): Payer: Medicare Other | Admitting: Cardiovascular Disease

## 2020-02-26 ENCOUNTER — Encounter: Payer: Self-pay | Admitting: Cardiovascular Disease

## 2020-02-26 ENCOUNTER — Other Ambulatory Visit: Payer: Self-pay

## 2020-02-26 VITALS — BP 110/64 | HR 65 | Ht 61.0 in | Wt 112.0 lb

## 2020-02-26 DIAGNOSIS — I340 Nonrheumatic mitral (valve) insufficiency: Secondary | ICD-10-CM

## 2020-02-26 NOTE — Patient Instructions (Signed)
Medication Instructions:  °Your physician recommends that you continue on your current medications as directed. Please refer to the Current Medication list given to you today. ° °*If you need a refill on your cardiac medications before your next appointment, please call your pharmacy* ° ° °Lab Work: °None ordered ° °If you have labs (blood work) drawn today and your tests are completely normal, you will receive your results only by: °• MyChart Message (if you have MyChart) OR °• A paper copy in the mail °If you have any lab test that is abnormal or we need to change your treatment, we will call you to review the results. ° ° °Testing/Procedures: °None ordered ° ° °Follow-Up: °At [facility], you and your health needs are our priority.  As part of our continuing mission to provide you with exceptional heart care, we have created designated Provider Care Teams.  These Care Teams include your primary Cardiologist (physician) and Advanced Practice Providers (APPs -  Physician Assistants and Nurse Practitioners) who all work together to provide you with the care you need, when you need it. ° °We recommend signing up for the patient portal called "MyChart".  Sign up information is provided on this After Visit Summary.  MyChart is used to connect with patients for Virtual Visits (Telemedicine).  Patients are able to view lab/test results, encounter notes, upcoming appointments, etc.  Non-urgent messages can be sent to your provider as well.   °To learn more about what you can do with MyChart, go to [email or web address].   ° °Your next appointment:   °6 month(s) ° °The format for your next appointment:   °In Person ° °Provider:   °You may see Peter Nishan, MD or one of the following Advanced Practice Providers on your designated Care Team:   °· Lori Gerhardt, NP °· Laura Ingold, NP °· Jill McDaniel, NP ° ° ° °Other Instructions ° ° °

## 2020-03-22 ENCOUNTER — Encounter: Payer: Self-pay | Admitting: Family Medicine

## 2020-04-22 ENCOUNTER — Other Ambulatory Visit: Payer: Self-pay | Admitting: Family Medicine

## 2020-04-22 NOTE — Telephone Encounter (Signed)
Pt daughter called following up on med refill. Please advise.

## 2020-05-23 DIAGNOSIS — D849 Immunodeficiency, unspecified: Secondary | ICD-10-CM | POA: Diagnosis not present

## 2020-05-23 DIAGNOSIS — Z944 Liver transplant status: Secondary | ICD-10-CM | POA: Diagnosis not present

## 2020-05-24 LAB — CBC AND DIFFERENTIAL
HCT: 33 — AB (ref 36–46)
Hemoglobin: 10.9 — AB (ref 12.0–16.0)
Neutrophils Absolute: 3
Platelets: 200 (ref 150–399)
WBC: 5.1

## 2020-05-24 LAB — CBC: RBC: 3.28 — AB (ref 3.87–5.11)

## 2020-05-24 LAB — BASIC METABOLIC PANEL
CO2: 25 — AB (ref 13–22)
Chloride: 96 — AB (ref 99–108)
Creatinine: 1 (ref 0.5–1.1)
Glucose: 114
Potassium: 4.5 (ref 3.4–5.3)
Sodium: 133 — AB (ref 137–147)

## 2020-05-24 LAB — COMPREHENSIVE METABOLIC PANEL
Albumin: 3.9 (ref 3.5–5.0)
Calcium: 9.4 (ref 8.7–10.7)
GFR calc Af Amer: 63
GFR calc non Af Amer: 55

## 2020-05-24 LAB — HEPATIC FUNCTION PANEL
Alkaline Phosphatase: 122 (ref 25–125)
Bilirubin, Total: 0.4

## 2020-05-26 ENCOUNTER — Encounter: Payer: Self-pay | Admitting: Family Medicine

## 2020-05-26 DIAGNOSIS — D539 Nutritional anemia, unspecified: Secondary | ICD-10-CM

## 2020-05-27 ENCOUNTER — Encounter: Payer: Self-pay | Admitting: Family Medicine

## 2020-05-30 DIAGNOSIS — D849 Immunodeficiency, unspecified: Secondary | ICD-10-CM | POA: Diagnosis not present

## 2020-05-30 DIAGNOSIS — Z944 Liver transplant status: Secondary | ICD-10-CM | POA: Diagnosis not present

## 2020-05-31 NOTE — Telephone Encounter (Signed)
Called and LVM for pt to schedule lab appt

## 2020-06-10 ENCOUNTER — Other Ambulatory Visit: Payer: Self-pay

## 2020-06-10 ENCOUNTER — Other Ambulatory Visit (INDEPENDENT_AMBULATORY_CARE_PROVIDER_SITE_OTHER): Payer: Medicare Other

## 2020-06-10 DIAGNOSIS — D539 Nutritional anemia, unspecified: Secondary | ICD-10-CM | POA: Diagnosis not present

## 2020-06-12 LAB — CBC
HCT: 34.6 % — ABNORMAL LOW (ref 35.0–45.0)
Hemoglobin: 11.5 g/dL — ABNORMAL LOW (ref 11.7–15.5)
MCH: 32.9 pg (ref 27.0–33.0)
MCHC: 33.2 g/dL (ref 32.0–36.0)
MCV: 98.9 fL (ref 80.0–100.0)
MPV: 9.5 fL (ref 7.5–12.5)
Platelets: 206 10*3/uL (ref 140–400)
RBC: 3.5 10*6/uL — ABNORMAL LOW (ref 3.80–5.10)
RDW: 11.6 % (ref 11.0–15.0)
WBC: 4.7 10*3/uL (ref 3.8–10.8)

## 2020-06-12 LAB — VITAMIN B12: Vitamin B-12: 1982 pg/mL — ABNORMAL HIGH (ref 200–1100)

## 2020-06-12 LAB — IRON,TIBC AND FERRITIN PANEL
%SAT: 17 % (calc) (ref 16–45)
Ferritin: 83 ng/mL (ref 16–288)
Iron: 48 ug/dL (ref 45–160)
TIBC: 277 mcg/dL (calc) (ref 250–450)

## 2020-06-12 LAB — FOLATE RBC: RBC Folate: >1000 ng/mL RBC (ref >280)

## 2020-06-20 ENCOUNTER — Other Ambulatory Visit (INDEPENDENT_AMBULATORY_CARE_PROVIDER_SITE_OTHER): Payer: Medicare Other

## 2020-06-20 DIAGNOSIS — D539 Nutritional anemia, unspecified: Secondary | ICD-10-CM

## 2020-06-21 LAB — FECAL OCCULT BLOOD, IMMUNOCHEMICAL: Fecal Occult Bld: NEGATIVE

## 2020-06-30 ENCOUNTER — Telehealth (INDEPENDENT_AMBULATORY_CARE_PROVIDER_SITE_OTHER): Payer: Medicare Other | Admitting: Family Medicine

## 2020-06-30 ENCOUNTER — Other Ambulatory Visit: Payer: Self-pay

## 2020-06-30 ENCOUNTER — Encounter: Payer: Self-pay | Admitting: Family Medicine

## 2020-06-30 VITALS — Ht 61.0 in | Wt 112.0 lb

## 2020-06-30 DIAGNOSIS — D649 Anemia, unspecified: Secondary | ICD-10-CM

## 2020-06-30 DIAGNOSIS — D849 Immunodeficiency, unspecified: Secondary | ICD-10-CM

## 2020-06-30 DIAGNOSIS — Z944 Liver transplant status: Secondary | ICD-10-CM | POA: Diagnosis not present

## 2020-06-30 DIAGNOSIS — F5101 Primary insomnia: Secondary | ICD-10-CM

## 2020-06-30 NOTE — Progress Notes (Signed)
Phone [phone] Virtual visit via Video note   Subjective:  Chief complaint: Chief Complaint  Patient presents with  . Follow-up    on medications    This visit type was conducted due to national recommendations for restrictions regarding the COVID-19 Pandemic (e.g. social distancing).  This format is felt to be most appropriate for this patient at this time balancing risks to patient and risks to population by having him in for in person visit.  No physical exam was performed (except for noted visual exam or audio findings with Telehealth visits).    Our team/I connected with [name] at  4:00 PM EDT by a video enabled telemedicine application ([email or web address] or caregility through epic) and verified that I am speaking with the correct person using two identifiers.  Location patient: [place] Location provider: [facility], office Persons participating in the virtual visit:  patient, daughter who is healthcare power of attorney  Our team/I discussed the limitations of evaluation and management by telemedicine and the availability of in person appointments. In light of current covid-19 pandemic, patient also understands that we are trying to protect them by minimizing in office contact if at all possible.  The patient expressed consent for telemedicine visit and agreed to proceed. Patient understands insurance will be billed.   Past Medical History-  Patient Active Problem List   Diagnosis Date Noted  . Seizure disorder ([place]) 09/21/2013    Priority: High  . Liver replaced by transplant ([place]) 06/08/2010    Priority: High  . Hepatic cirrhosis ([place]) 06/09/2007    Priority: High  . Anemia 06/30/2020    Priority: Medium  . CKD (chronic kidney disease), stage III 05/03/2017    Priority: Medium  . Mitral valve insufficiency and aortic valve insufficiency     Priority: Medium  . Hyponatremia 05/17/2014    Priority: Medium  . Aortic insufficiency 12/03/2012    Priority: Medium  .  Insomnia 06/08/2010    Priority: Medium  . Osteoporosis 06/09/2007    Priority: Medium  . History of skin cancer 05/03/2017    Priority: Low  . GERD (gastroesophageal reflux disease) 12/13/2014    Priority: Low  . Dizziness 05/17/2014    Priority: Low  . Immunosuppressed status ([place]) 06/02/2019    Medications- reviewed and updated Current Outpatient Medications  Medication Sig Dispense Refill  . alendronate (FOSAMAX) 70 MG tablet TAKE 1 TABLET BY MOUTH  EVERY 7 DAYS WITH A FULL  GLASS OF WATER ON AN EMPTY  STOMACH 12 tablet 3  . Biotin 5 MG CAPS Take 5 mg by mouth daily.    . Calcium Carbonate-Vit D-Min (CALCIUM 1200 PO) Take 1 tablet by mouth daily.    . Cyanocobalamin (B-12 PO) Take by mouth.    . levETIRAcetam (KEPPRA) 250 MG tablet TAKE 1 TABLET BY MOUTH TWO  TIMES DAILY 180 tablet 3  . MAGNESIUM PO Take by mouth.    . Multiple Vitamin (MULTIVITAMIN) tablet Take 1 tablet by mouth daily.      . tacrolimus (PROGRAF) 1 MG capsule Take 2 mg by mouth 2 (two) times daily. Take 2 capsules in the morning and 2 capsule a night    . zolpidem (AMBIEN CR) 6.25 MG CR tablet TAKE ONE TABLET BY MOUTH EVERY NIGHT AT BEDTIME AS NEEDED FOR SLEEP 30 tablet 2   No current facility-administered medications for this visit.     Objective:  Ht 5\' 1"  (1.549 m)   Wt 112 lb (50.8 kg)   LMP  (  LMP Unknown)   BMI 21.16 kg/m  self reported vitals Gen: NAD, resting comfortably Lungs: nonlabored, normal respiratory rate  Skin: appears dry, no obvious rash     Assessment and Plan   #Anemia S: On May 26, 2020-care everywhere shows a note from [place] transplant clinic with concern about anemia.  Daughter reached out to [place] with this concern.  Hemoglobin had dropped from 11.9-10.9 and [facility] wanted to make sure was not further decreasing.  We checked a hemoglobin 2 weeks ago and increased to 11.5 g/dL thankfully.  Also out of an abundance of precaution we checked folate RBC, B12, iron stores which were  largely reassuring.  Fecal occult blood test was also negative. A/P: Thankfully anemia is not worsening.  She has had largely stable chronic anemia at least since 2017 on our labs-thankful no ongoing downward trend.  Also thankful for focal blood test was negative.  She has regular blood work follow-up with [facility] and this can be rechecked.  Could be anemia of chronic disease  # Insomnia  S: Patient feels like she did better when Used to be on ambien 12.5mg  ER.  We reduced to 6.25 mg due to age as well as gender.  Our plan was to continue this as long as she had no falls.  Patient does not drive.  Has use good Rx to help with cost.  Trazodone previously worsened liver function tests  Patient reports she gets very tired in the early evening and then by 7 or 8 then wakes up til 11 or 12 and then not asleep until 3-4 . Most she gets is 3-4 hours.   Also does melatonin or zquill at times (ZzzQuil not ideal due to age).   Patient has stopped sitting in her bed to do other activities other than sleep.  She is still using screen time up to an hour before bed. A/P: Insomnia is not ideally controlled but patient has had LFT elevations with prior medications such as trazodone and it may be difficult/cause a lot of needed follow-up if we need to transition at this time.  I do not think it safe to increase Ambien to 12.5 mg.  Ultimately we decided to continue current medications -Did some counseling on sleep hygiene such as avoiding screen time an hour before bed  #COVID-19 vaccination counseling in liver transplant patient with immunosuppressed status -Dr. Loletha Grayer with [place] transplant team still recommends COVID-19 vaccination despite the fact that it may not be quite as effective in transplant patient.  Patient has essentially been quarantining at her home.  I was honest with patient and daughter that I personally thought vaccination will be a good idea for her-sometimes emergency visits outside of the home are  required such as if she were to have chest pain and require emergency room visit.  Daughter was also concerned due to prior LFT elevations with other medications and for [facility] would need close follow-up if received this vaccination-due to close follow-up plan I feel comfortable with her receiving vaccination.  Previously patient/daughter's plan was to not be vaccinated but did agree to reconsider after discussion  Dr. Loletha Grayer at end of June virtual visit- transplant patients recommended  -may not be as effective.   #High B12-patient denies true low B12 in the past that she can recall.  We had planned to decrease to once a week B12 instead of daily with high B12 level-we can recheck B12 with future labs  #Cardiac follow-up-patient with mild aortic regurgitation and stenosis  on echocardiogram September 2020 as well as severe mitral regurgitation but not a candidate for mitral clip.  Patient continues to follow-up with Dr. Johnsie Cancel every 6 months-encourage patient to keep follow-up  Recommended follow up: Has close follow-up with [facility] but I would also like to see her at least once yearly Future Appointments  Date Time Provider [place]  08/30/2020  4:15 PM Josue Hector, MD [facility]    Lab/Order associations:   ICD-10-CM   1. Liver replaced by transplant ([place])  Z94.4   2. Primary insomnia  F51.01   3. Anemia, unspecified type  D64.9   4. Immunosuppressed status ([place])  D84.9    Time Spent: 37 minutes of total time (4:35 PM- 5:00 PM, 9:41 PM-9:53 PM ) was spent on the date of the encounter performing the following actions: chart review prior to seeing the patient, obtaining history, performing a medically necessary exam, counseling on the treatment plan, placing orders, and documenting in our EHR.   Return precautions advised.  Garret Reddish, MD

## 2020-06-30 NOTE — Patient Instructions (Signed)
There are no preventive care reminders to display for this patient.  Recommended follow up: No follow-ups on file. 

## 2020-07-18 ENCOUNTER — Other Ambulatory Visit: Payer: Self-pay | Admitting: Family Medicine

## 2020-07-18 ENCOUNTER — Encounter: Payer: Self-pay | Admitting: Family Medicine

## 2020-07-18 DIAGNOSIS — D849 Immunodeficiency, unspecified: Secondary | ICD-10-CM | POA: Diagnosis not present

## 2020-07-18 DIAGNOSIS — Z944 Liver transplant status: Secondary | ICD-10-CM | POA: Diagnosis not present

## 2020-07-18 NOTE — Telephone Encounter (Signed)
Pt requesting refill

## 2020-07-28 DIAGNOSIS — H353132 Nonexudative age-related macular degeneration, bilateral, intermediate dry stage: Secondary | ICD-10-CM | POA: Diagnosis not present

## 2020-07-28 DIAGNOSIS — H04123 Dry eye syndrome of bilateral lacrimal glands: Secondary | ICD-10-CM | POA: Diagnosis not present

## 2020-07-28 DIAGNOSIS — H3554 Dystrophies primarily involving the retinal pigment epithelium: Secondary | ICD-10-CM | POA: Diagnosis not present

## 2020-07-28 DIAGNOSIS — H524 Presbyopia: Secondary | ICD-10-CM | POA: Diagnosis not present

## 2020-07-28 DIAGNOSIS — H2513 Age-related nuclear cataract, bilateral: Secondary | ICD-10-CM | POA: Diagnosis not present

## 2020-08-09 DIAGNOSIS — D849 Immunodeficiency, unspecified: Secondary | ICD-10-CM | POA: Diagnosis not present

## 2020-08-09 DIAGNOSIS — Z944 Liver transplant status: Secondary | ICD-10-CM | POA: Diagnosis not present

## 2020-08-16 NOTE — Progress Notes (Signed)
Cardiology Office Note:    Date:  08/30/2020   ID:  [name], DOB 1931-12-13, MRN [id number]  PCP:  Marin Olp, MD  Cardiologist:  Dr. Jenkins Rouge   Electrophysiologist:  N/a Hepatologist/Live Transplant at [facility]: Dr. Enis Gash Neurologist: Dr. Krista Blue  Referring MD: Marin Olp, MD   No chief complaint on file.   History of Present Illness:    [name] is a 86 y.o. female with a hx of valvular heart disease with mild AR/AS and severe MR  , hepatic cirrhosis s/p liver transplant, seizures  Tells fascinating story of getting liver transplant at age 49 at [facility] after having cardiopulmonary syndrome with cirrhosis from ETOH abuse  Husband passed in 2020  The patient denies chest pain, shortness of breath, syncope, orthopnea, PND or significant pedal edema.   Echo 08/05/19 reviewed: MR now severe with mild AS/AR   She is not symptomatic Due to severe calcium and MAC not a mitral clip candidate Long discussion about progression of valve disease She lives Independently in apartment  Still walks 2x/day but no longer dancing to [place] Daughter and son look in on her Not driving   Also discussed risk of deterioration due to afib. Surprisingly she is in sinus despite age , severe LAE and severe MR. Could consider addition of lasix and beta blocker if she were to have afib. She is euvolemic at this time and for her age functional class one Monitor done 09/28/19 showed no PAF  She has not done much since COVID. She finally got vaccinated last month   No CHF/PAF symptoms   Past Medical History:  Diagnosis Date  . Cirrhosis [facility])    s/p Liver Transplant - follow at [facility]  . History of cardiac catheterization    a. LHC 06/2006: EF 65-70%, pLAD 20%  . History of GI bleed    patient denies  . Mitral valve insufficiency and aortic valve insufficiency    a. Echo 3/14:  Mild LVH, EF 55-65%, Gr 1 DD, moderate AI, MAC, mild mitral stenosis, mild to mod MR, mod LAE, PASP 34  mmHg //  b. Echo 6/15: Mild LVH, EF 55-60%, Gr 2 DD, mode AI, MAC, trivial mitral stenosis (mean gradient 5 mmHg), mod MR, mod LAE, PASP 30 mmHg  //  c. Echo 6/16: EF 55-60%, no RWMA, Gr 2 DD, mod AI, mildly dilated Asc aorta (38 mm), MAC, mod MS, moderate MR, PASP 31 mmHg   . Osteoporosis   . Seizure disorder [facility])    followed by Neuro    Past Surgical History:  Procedure Laterality Date  . LIVER TRANSPLANT  08/08    Current Medications: Outpatient Medications Prior to Visit  Medication Sig Dispense Refill  . alendronate (FOSAMAX) 70 MG tablet TAKE 1 TABLET BY MOUTH  EVERY 7 DAYS WITH A FULL  GLASS OF WATER ON AN EMPTY  STOMACH 12 tablet 3  . Biotin 5 MG CAPS Take 5 mg by mouth daily.    . Calcium Carbonate-Vit D-Min (CALCIUM 1200 PO) Take 1 tablet by mouth daily.    . carvedilol (COREG) 3.125 MG tablet TAKE ONE TABLET BY MOUTH TWICE A DAY 180 tablet 1  . cholecalciferol (VITAMIN D) 25 MCG (1000 UNIT) tablet Take 1,000 Units by mouth daily.    . Cyanocobalamin (B-12 PO) Take by mouth once a week.     . levETIRAcetam (KEPPRA) 250 MG tablet TAKE 1 TABLET BY MOUTH TWO  TIMES DAILY 180 tablet 3  .  MAGNESIUM PO Take by mouth.    . Multiple Vitamin (MULTIVITAMIN) tablet Take 1 tablet by mouth daily.      . tacrolimus (PROGRAF) 1 MG capsule Take 2 mg by mouth 2 (two) times daily. 2 MG AM AND 3 MG PM    . zolpidem (AMBIEN CR) 6.25 MG CR tablet TAKE ONE TABLET BY MOUTH EVERY NIGHT AT BEDTIME AS NEEDED FOR SLEEP 30 tablet 5   No facility-administered medications prior to visit.      Allergies:   Nsaids and Codeine   Social History   Socioeconomic History  . Marital status: Widowed    Spouse name: [name]   . Number of children: 3  . Years of education: 12th  . Highest education level: Not on file  Occupational History  . Occupation: Retired  Tobacco Use  . Smoking status: Former Research scientist (life sciences)  . Smokeless tobacco: Never Used  Vaping Use  . Vaping Use: Never used  Substance and Sexual  Activity  . Alcohol use: No    Comment: h/o alcohol abuse  . Drug use: No  . Sexual activity: Not on file  Other Topics Concern  . Not on file  Social History Narrative   Patient lives at home with husband [name].    Daughter lives close and extremely supportive      Patient has 3 children. 4 grandchildren. No greatgrandchildren.    Patient has a high school education level.    Patient is retired from [employer], cared for her children      Hobbies: dancing ([employer] singing group)-fewer and fewer get togethers   Social Determinants of Radio broadcast assistant Strain:   . Difficulty of Paying Living Expenses: Not on file  Food Insecurity:   . Worried About Charity fundraiser in the Last Year: Not on file  . Ran Out of Food in the Last Year: Not on file  Transportation Needs:   . Lack of Transportation (Medical): Not on file  . Lack of Transportation (Non-Medical): Not on file  Physical Activity:   . Days of Exercise per Week: Not on file  . Minutes of Exercise per Session: Not on file  Stress:   . Feeling of Stress : Not on file  Social Connections:   . Frequency of Communication with Friends and Family: Not on file  . Frequency of Social Gatherings with Friends and Family: Not on file  . Attends Religious Services: Not on file  . Active Member of Clubs or Organizations: Not on file  . Attends Archivist Meetings: Not on file  . Marital Status: Not on file     Family History:  The patient's family history includes Heart attack (age of onset: 16) in her mother.   ROS:   Please see the history of present illness.    Review of Systems  Hematologic/Lymphatic: Bruises/bleeds easily.   All other systems reviewed and are negative.   Physical Exam:    VS:  BP 118/60   Pulse 75   Ht _0  (1.549 m)   Wt 109 lb 9.6 oz (49.7 kg)   LMP  (LMP Unknown)   SpO2 91%   BMI 20.71 kg/m     Wt Readings from Last 3 Encounters:  08/30/20 109 lb 9.6 oz (49.7 kg)   06/30/20 112 lb (50.8 kg)  02/26/20 112 lb (50.8 kg)     Affect appropriate Healthy:  appears stated age HEENT: normal Neck supple with no adenopathy JVP normal  no bruits no thyromegaly Lungs clear with no wheezing and good diaphragmatic motion Heart:  S1/S2 AR and MR  murmur, no rub, gallop or click PMI normal Abdomen: benighn, BS positve, no tenderness, no AAA no bruit.  No HSM or HJR Distal pulses intact with no bruits No edema Neuro non-focal Skin warm and dry No muscular weakness   Studies/Labs Reviewed:    EKG:   07-30-2018 SR rate 66 RBBB 08/05/19 SR RBBB old rate 72 stable   Recent Labs: 05/24/2020: Creatinine 1.0; Potassium 4.5; Sodium 133 06/10/2020: Hemoglobin 11.5; Platelets 206   Recent Lipid Panel    Component Value Date/Time   CHOL 164 03/03/2014 0907   TRIG 97.0 03/03/2014 0907   HDL 69.10 03/03/2014 0907   CHOLHDL 2 03/03/2014 0907   VLDL 19.4 03/03/2014 0907   LDLCALC 76 03/03/2014 0907    Additional studies/ records that were reviewed today include:   Echo 08/05/19  Normal EF mild AS/AR severe MR  LHC 7/07 EF 65-70% LM normal Proximal LAD irregularity 20%  Long Term monitor 09/28/19  SR average HR 72 rare PAC/PVC < 1%  Short runs atrial tachycardia longest only 13 beats No PaF   ASSESSMENT:    Severe MR   PLAN:    In order of problems listed above:  1. AV Disease:  Stable mild AR AS by TTE 08/29/19  2. MV Disease: severe MR not candidate for mitral clip observe  Risk of PAF discussed  3. Liver Transplant: followed at [facility] on prograf 4. Seizure: on keppra none since transplant f/u Dr Krista Blue  5. Varicose Veins:  Encouraged use of compression stockings elevate legs latter in day when possible 6. RBBB:  Chronic no high grade AV block   F/U with cardiology in a 6 months    Jenkins Rouge

## 2020-08-23 ENCOUNTER — Other Ambulatory Visit: Payer: Self-pay | Admitting: Cardiovascular Disease

## 2020-08-30 ENCOUNTER — Encounter: Payer: Self-pay | Admitting: Cardiovascular Disease

## 2020-08-30 ENCOUNTER — Ambulatory Visit: Payer: Medicare Other | Admitting: Cardiovascular Disease

## 2020-08-30 ENCOUNTER — Other Ambulatory Visit: Payer: Self-pay

## 2020-08-30 VITALS — BP 118/60 | HR 75 | Ht 61.0 in | Wt 109.6 lb

## 2020-08-30 DIAGNOSIS — I351 Nonrheumatic aortic (valve) insufficiency: Secondary | ICD-10-CM

## 2020-08-30 DIAGNOSIS — I4510 Unspecified right bundle-branch block: Secondary | ICD-10-CM

## 2020-08-30 NOTE — Patient Instructions (Signed)
Medication Instructions:  *If you need a refill on your cardiac medications before your next appointment, please call your pharmacy*  Lab work: If you have labs (blood work) drawn today and your tests are completely normal, you will receive your results only by: Marland Kitchen MyChart Message (if you have MyChart) OR . A paper copy in the mail If you have any lab test that is abnormal or we need to change your treatment, we will call you to review the results.  Testing/Procedures: None ordered today.  Follow-Up: At [facility], you and your health needs are our priority.  As part of our continuing mission to provide you with exceptional heart care, we have created designated Provider Care Teams.  These Care Teams include your primary Cardiologist (physician) and Advanced Practice Providers (APPs -  Physician Assistants and Nurse Practitioners) who all work together to provide you with the care you need, when you need it.  We recommend signing up for the patient portal called "MyChart".  Sign up information is provided on this After Visit Summary.  MyChart is used to connect with patients for Virtual Visits (Telemedicine).  Patients are able to view lab/test results, encounter notes, upcoming appointments, etc.  Non-urgent messages can be sent to your provider as well.   To learn more about what you can do with MyChart, go to [email or web address].    Your next appointment:   6 month(s)  The format for your next appointment:   In Person  Provider:   You may see Jenkins Rouge, MD or one of the following Advanced Practice Providers on your designated Care Team:    Truitt Merle, NP  Cecilie Kicks, NP  Kathyrn Drown, NP

## 2020-09-12 ENCOUNTER — Other Ambulatory Visit: Payer: Self-pay | Admitting: Family Medicine

## 2020-09-19 DIAGNOSIS — L820 Inflamed seborrheic keratosis: Secondary | ICD-10-CM | POA: Diagnosis not present

## 2020-09-19 DIAGNOSIS — L821 Other seborrheic keratosis: Secondary | ICD-10-CM | POA: Diagnosis not present

## 2020-09-22 ENCOUNTER — Encounter: Payer: Self-pay | Admitting: Family Medicine

## 2020-09-22 ENCOUNTER — Other Ambulatory Visit: Payer: Self-pay

## 2020-09-22 ENCOUNTER — Ambulatory Visit (INDEPENDENT_AMBULATORY_CARE_PROVIDER_SITE_OTHER): Payer: Medicare Other

## 2020-09-22 DIAGNOSIS — Z23 Encounter for immunization: Secondary | ICD-10-CM | POA: Diagnosis not present

## 2020-10-07 ENCOUNTER — Other Ambulatory Visit: Payer: Self-pay | Admitting: Family Medicine

## 2020-10-21 DIAGNOSIS — L820 Inflamed seborrheic keratosis: Secondary | ICD-10-CM | POA: Diagnosis not present

## 2020-11-16 DIAGNOSIS — Z944 Liver transplant status: Secondary | ICD-10-CM | POA: Diagnosis not present

## 2020-11-16 DIAGNOSIS — D849 Immunodeficiency, unspecified: Secondary | ICD-10-CM | POA: Diagnosis not present

## 2020-11-18 DIAGNOSIS — C44319 Basal cell carcinoma of skin of other parts of face: Secondary | ICD-10-CM | POA: Diagnosis not present

## 2020-11-18 DIAGNOSIS — L570 Actinic keratosis: Secondary | ICD-10-CM | POA: Diagnosis not present

## 2020-11-18 DIAGNOSIS — D485 Neoplasm of uncertain behavior of skin: Secondary | ICD-10-CM | POA: Diagnosis not present

## 2021-01-02 DIAGNOSIS — C44319 Basal cell carcinoma of skin of other parts of face: Secondary | ICD-10-CM | POA: Diagnosis not present

## 2021-01-07 ENCOUNTER — Encounter: Payer: Self-pay | Admitting: Family Medicine

## 2021-01-09 ENCOUNTER — Other Ambulatory Visit: Payer: Self-pay

## 2021-01-09 NOTE — Telephone Encounter (Signed)
LVM asking to call back.  

## 2021-01-09 NOTE — Telephone Encounter (Signed)
Please schedule pt for med refill appointment

## 2021-01-09 NOTE — Telephone Encounter (Signed)
Needs to be set up for visit 1 year out from last visit. Also is this really 30 day supply to mail order pharmacy or is there a different pharmacy?

## 2021-01-09 NOTE — Telephone Encounter (Signed)
Daughter is requesting for refill to be sent to [facility], [place] and is scheduled for an appointment scheduled for 07/03/21.

## 2021-01-10 MED ORDER — ZOLPIDEM TARTRATE ER 6.25 MG PO TBCR: EXTENDED_RELEASE_TABLET | ORAL | 5 refills | Status: DC

## 2021-01-10 NOTE — Telephone Encounter (Signed)
Pt is scheduled, see below.

## 2021-01-30 DIAGNOSIS — L905 Scar conditions and fibrosis of skin: Secondary | ICD-10-CM | POA: Diagnosis not present

## 2021-02-14 DIAGNOSIS — Z944 Liver transplant status: Secondary | ICD-10-CM | POA: Diagnosis not present

## 2021-02-14 DIAGNOSIS — D849 Immunodeficiency, unspecified: Secondary | ICD-10-CM | POA: Diagnosis not present

## 2021-02-24 NOTE — Progress Notes (Signed)
Cardiology Office Note:    Date:  03/03/2021   ID:  [name], DOB [birth year], MRN [id number]  PCP:  Marin Olp, MD  Cardiologist:  Dr. Jenkins Rouge   Electrophysiologist:  N/a Hepatologist/Live Transplant at [facility]: Dr. Enis Gash Neurologist: Dr. Krista Blue  Referring MD: Marin Olp, MD   No chief complaint on file.   History of Present Illness:    [name] is a 87 y.o. female with a hx of valvular heart disease with mild AR/AS and severe MR  , hepatic cirrhosis s/p liver transplant, seizures  Tells fascinating story of getting liver transplant at age 33 at [facility] after having cardiopulmonary syndrome with cirrhosis from ETOH abuse  Husband passed in 2020  The patient denies chest pain, shortness of breath, syncope, orthopnea, PND or significant pedal edema.   Echo 08/05/19 reviewed: MR now severe with mild AS/AR   She is not symptomatic Due to severe calcium and MAC not a mitral clip candidate Long discussion about progression of valve disease She lives Independently in apartment  Still walks 2x/day but no longer dancing to [place] Daughter and son look in on her Not driving   Also discussed risk of deterioration due to afib. Surprisingly she is in sinus despite age , severe LAE and severe MR. Could consider addition of lasix and beta blocker if she were to have afib. She is euvolemic at this time and for her age functional class one Monitor done 09/28/19 showed no PAF  She has not done much since COVID.   No CHF/PAF symptoms   Her oldest daughter was unvaccinated and died of COVID last year   Past Medical History:  Diagnosis Date  . Cirrhosis [facility])    s/p Liver Transplant - follow at [facility]  . History of cardiac catheterization    a. LHC 06/2006: EF 65-70%, pLAD 20%  . History of GI bleed    patient denies  . Mitral valve insufficiency and aortic valve insufficiency    a. Echo 3/14:  Mild LVH, EF 55-65%, Gr 1 DD, moderate AI, MAC, mild mitral stenosis, mild  to mod MR, mod LAE, PASP 34 mmHg //  b. Echo 6/15: Mild LVH, EF 55-60%, Gr 2 DD, mode AI, MAC, trivial mitral stenosis (mean gradient 5 mmHg), mod MR, mod LAE, PASP 30 mmHg  //  c. Echo 6/16: EF 55-60%, no RWMA, Gr 2 DD, mod AI, mildly dilated Asc aorta (38 mm), MAC, mod MS, moderate MR, PASP 31 mmHg   . Osteoporosis   . Seizure disorder [facility])    followed by Neuro    Past Surgical History:  Procedure Laterality Date  . LIVER TRANSPLANT  08/08    Current Medications: Outpatient Medications Prior to Visit  Medication Sig Dispense Refill  . alendronate (FOSAMAX) 70 MG tablet TAKE 1 TABLET BY MOUTH  EVERY 7 DAYS WITH A FULL  GLASS OF WATER ON AN EMPTY  STOMACH 12 tablet 3  . Biotin 5 MG CAPS Take 5 mg by mouth daily.    . Calcium Carbonate-Vit D-Min (CALCIUM 1200 PO) Take 1 tablet by mouth daily.    . carvedilol (COREG) 3.125 MG tablet TAKE ONE TABLET BY MOUTH TWICE A DAY 180 tablet 1  . cholecalciferol (VITAMIN D) 25 MCG (1000 UNIT) tablet Take 1,000 Units by mouth daily.    . Cyanocobalamin (B-12 PO) Take by mouth once a week.     . levETIRAcetam (KEPPRA) 250 MG tablet TAKE 1 TABLET BY MOUTH  TWICE DAILY 180 tablet 3  . MAGNESIUM PO Take by mouth.    . Multiple Vitamin (MULTIVITAMIN) tablet Take 1 tablet by mouth daily.    . tacrolimus (PROGRAF) 1 MG capsule Take 2 mg by mouth 2 (two) times daily. 2 MG AM AND 3 MG PM    . zolpidem (AMBIEN CR) 6.25 MG CR tablet TAKE ONE TABLET BY MOUTH EVERY NIGHT AT BEDTIME AS NEEDED FOR SLEEP 30 tablet 5   No facility-administered medications prior to visit.      Allergies:   Nsaids and Codeine   Social History   Socioeconomic History  . Marital status: Widowed    Spouse name: [name]   . Number of children: 3  . Years of education: 12th  . Highest education level: Not on file  Occupational History  . Occupation: Retired  Tobacco Use  . Smoking status: Former Research scientist (life sciences)  . Smokeless tobacco: Never Used  Vaping Use  . Vaping Use: Never used   Substance and Sexual Activity  . Alcohol use: No    Comment: h/o alcohol abuse  . Drug use: No  . Sexual activity: Not on file  Other Topics Concern  . Not on file  Social History Narrative   Patient lives at home with husband [name].    Daughter lives close and extremely supportive      Patient has 3 children. 4 grandchildren. No greatgrandchildren.    Patient has a high school education level.    Patient is retired from [employer], cared for her children      Hobbies: dancing ([place] singing group)-fewer and fewer get togethers   Social Determinants of Radio broadcast assistant Strain: Not on file  Food Insecurity: Not on file  Transportation Needs: Not on file  Physical Activity: Not on file  Stress: Not on file  Social Connections: Not on file     Family History:  The patient's family history includes Heart attack (age of onset: 65) in her mother.   ROS:   Please see the history of present illness.    Review of Systems  Hematologic/Lymphatic: Bruises/bleeds easily.   All other systems reviewed and are negative.   Physical Exam:    VS:  BP (!) 112/50   Pulse 74   Ht 5' (1.524 m)   Wt 48.5 kg   LMP  (LMP Unknown)   SpO2 93%   BMI 20.90 kg/m     Wt Readings from Last 3 Encounters:  03/03/21 48.5 kg  08/30/20 49.7 kg  06/30/20 50.8 kg     Affect appropriate Healthy:  appears stated age 68: normal Neck supple with no adenopathy JVP normal no bruits no thyromegaly Lungs clear with no wheezing and good diaphragmatic motion Heart:  S1/S2 AR and MR  murmur, no rub, gallop or click PMI normal Abdomen: benighn, BS positve, no tenderness, no AAA no bruit.  No HSM or HJR Distal pulses intact with no bruits No edema Neuro non-focal Skin warm and dry No muscular weakness   Studies/Labs Reviewed:    EKG:   07-06-18 SR rate 66 RBBB 08/05/19 SR RBBB old rate 72 stable   Recent Labs: 05/24/2020: Creatinine 1.0; Potassium 4.5; Sodium 133 06/10/2020:  Hemoglobin 11.5; Platelets 206   Recent Lipid Panel    Component Value Date/Time   CHOL 164 03/03/2014 0907   TRIG 97.0 03/03/2014 0907   HDL 69.10 03/03/2014 0907   CHOLHDL 2 03/03/2014 0907   VLDL 19.4 03/03/2014 0907  [place] 76 03/03/2014 0907    Additional studies/ records that were reviewed today include:   Echo 08/05/19  Normal EF mild AS/AR severe MR  LHC 7/07 EF 65-70% LM normal Proximal LAD irregularity 20%  Long Term monitor 09/28/19  SR average HR 72 rare PAC/PVC < 1%  Short runs atrial tachycardia longest only 13 beats No PaF   ASSESSMENT:    Severe MR   PLAN:    In order of problems listed above:  1. AV Disease:  Stable mild AR AS by TTE 08/29/19  2. MV Disease: severe MR not candidate for mitral clip observe  Risk of PAF discussed  3. Liver Transplant: followed at [facility] on prograf 4. Seizure: on keppra none since transplant f/u Dr Krista Blue  5. Varicose Veins:  Encouraged use of compression stockings elevate legs latter in day when possible 6. RBBB:  Chronic no high grade AV block   F/U with cardiology in a 6 months    Jenkins Rouge

## 2021-02-25 ENCOUNTER — Other Ambulatory Visit: Payer: Self-pay | Admitting: Cardiovascular Disease

## 2021-02-27 DIAGNOSIS — L905 Scar conditions and fibrosis of skin: Secondary | ICD-10-CM | POA: Diagnosis not present

## 2021-03-03 ENCOUNTER — Encounter: Payer: Self-pay | Admitting: Cardiovascular Disease

## 2021-03-03 ENCOUNTER — Other Ambulatory Visit: Payer: Self-pay

## 2021-03-03 ENCOUNTER — Ambulatory Visit: Payer: Medicare Other | Admitting: Cardiovascular Disease

## 2021-03-03 VITALS — BP 112/50 | HR 74 | Ht 60.0 in | Wt 107.0 lb

## 2021-03-03 DIAGNOSIS — I340 Nonrheumatic mitral (valve) insufficiency: Secondary | ICD-10-CM | POA: Diagnosis not present

## 2021-03-03 NOTE — Patient Instructions (Signed)

## 2021-03-15 ENCOUNTER — Telehealth: Payer: Self-pay | Admitting: Family Medicine

## 2021-03-15 NOTE — Telephone Encounter (Signed)
Left message for patient to call back and schedule Medicare Annual Wellness Visit (AWV) either virtually OR in office.   Last AWV 05/02/16; please schedule at anytime with [facility]Nurse Health Advisor at [facility].  This should be a 45 minute visit.

## 2021-05-09 DIAGNOSIS — Z944 Liver transplant status: Secondary | ICD-10-CM | POA: Diagnosis not present

## 2021-05-09 DIAGNOSIS — D849 Immunodeficiency, unspecified: Secondary | ICD-10-CM | POA: Diagnosis not present

## 2021-05-09 LAB — CBC AND DIFFERENTIAL
HCT: 30 — AB (ref 36–46)
Hemoglobin: 11.3 — AB (ref 12.0–16.0)
Neutrophils Absolute: 3
Platelets: 197 (ref 150–399)
WBC: 4.7

## 2021-05-09 LAB — BASIC METABOLIC PANEL
BUN: 18 (ref 4–21)
CO2: 26 — AB (ref 13–22)
Chloride: 99 (ref 99–108)
Creatinine: 1 (ref 0.5–1.1)
Glucose: 100
Potassium: 4.7 (ref 3.4–5.3)
Sodium: 136 — AB (ref 137–147)

## 2021-05-09 LAB — COMPREHENSIVE METABOLIC PANEL
Albumin: 4 (ref 3.5–5.0)
Calcium: 9.3 (ref 8.7–10.7)

## 2021-05-09 LAB — HEPATIC FUNCTION PANEL
AST: 27 (ref 13–35)
Alkaline Phosphatase: 120 (ref 25–125)

## 2021-05-09 LAB — CBC: RBC: 2.93 — AB (ref 3.87–5.11)

## 2021-05-15 DIAGNOSIS — Z87891 Personal history of nicotine dependence: Secondary | ICD-10-CM | POA: Diagnosis not present

## 2021-05-15 DIAGNOSIS — Z4823 Encounter for aftercare following liver transplant: Secondary | ICD-10-CM | POA: Diagnosis not present

## 2021-05-15 DIAGNOSIS — D849 Immunodeficiency, unspecified: Secondary | ICD-10-CM | POA: Diagnosis not present

## 2021-05-15 DIAGNOSIS — Z944 Liver transplant status: Secondary | ICD-10-CM | POA: Diagnosis not present

## 2021-05-15 DIAGNOSIS — D84821 Immunodeficiency due to drugs: Secondary | ICD-10-CM | POA: Diagnosis not present

## 2021-05-15 DIAGNOSIS — Z79899 Other long term (current) drug therapy: Secondary | ICD-10-CM | POA: Diagnosis not present

## 2021-05-17 ENCOUNTER — Encounter: Payer: Self-pay | Admitting: Family Medicine

## 2021-07-03 ENCOUNTER — Other Ambulatory Visit: Payer: Self-pay

## 2021-07-03 ENCOUNTER — Encounter: Payer: Self-pay | Admitting: Family Medicine

## 2021-07-03 ENCOUNTER — Telehealth (INDEPENDENT_AMBULATORY_CARE_PROVIDER_SITE_OTHER): Payer: Medicare Other | Admitting: Family Medicine

## 2021-07-03 VITALS — Ht 60.0 in

## 2021-07-03 DIAGNOSIS — D649 Anemia, unspecified: Secondary | ICD-10-CM | POA: Diagnosis not present

## 2021-07-03 DIAGNOSIS — M810 Age-related osteoporosis without current pathological fracture: Secondary | ICD-10-CM

## 2021-07-03 DIAGNOSIS — G40909 Epilepsy, unspecified, not intractable, without status epilepticus: Secondary | ICD-10-CM

## 2021-07-03 DIAGNOSIS — F5101 Primary insomnia: Secondary | ICD-10-CM

## 2021-07-03 DIAGNOSIS — Z944 Liver transplant status: Secondary | ICD-10-CM

## 2021-07-03 DIAGNOSIS — K7460 Unspecified cirrhosis of liver: Secondary | ICD-10-CM

## 2021-07-03 DIAGNOSIS — N183 Chronic kidney disease, stage 3 unspecified: Secondary | ICD-10-CM

## 2021-07-03 MED ORDER — ZOLPIDEM TARTRATE 5 MG PO TABS: 5.0000 mg | ORAL_TABLET | Freq: Every evening | ORAL | 5 refills | Status: DC | PRN

## 2021-07-03 NOTE — Progress Notes (Signed)
Phone [phone] Virtual visit via Video note   Subjective:  Chief complaint: Chief Complaint  Patient presents with   Insomnia   This visit type was conducted due to national recommendations for restrictions regarding the COVID-19 Pandemic (e.g. social distancing).  This format is felt to be most appropriate for this patient at this time balancing risks to patient and risks to population by having him in for in person visit.  No physical exam was performed (except for noted visual exam or audio findings with Telehealth visits).    Our team/I connected with [name] at  4:00 PM EDT by a video enabled telemedicine application ([email or web address] or caregility through epic) and verified that I am speaking with the correct person using two identifiers.  Location patient: Home[place] Location provider: [facility], office Persons participating in the virtual visit:  patient  Our team/I discussed the limitations of evaluation and management by telemedicine and the availability of in person appointments. In light of current covid-19 pandemic, patient also understands that we are trying to protect them by minimizing in office contact if at all possible.  The patient expressed consent for telemedicine visit and agreed to proceed. Patient understands insurance will be billed.   Past Medical History-  Patient Active Problem List   Diagnosis Date Noted   Seizure disorder ([place]) 09/21/2013    Priority: High   Liver replaced by transplant ([place]) 06/08/2010    Priority: High   Hepatic cirrhosis ([place]) 06/09/2007    Priority: High   Anemia 06/30/2020    Priority: Medium   CKD (chronic kidney disease), stage III ([place]) 05/03/2017    Priority: Medium   Mitral valve insufficiency and aortic valve insufficiency     Priority: Medium   Hyponatremia 05/17/2014    Priority: Medium   Aortic insufficiency 12/03/2012    Priority: Medium   Insomnia 06/08/2010    Priority: Medium   Osteoporosis 06/09/2007     Priority: Medium   History of skin cancer 05/03/2017    Priority: Low   GERD (gastroesophageal reflux disease) 12/13/2014    Priority: Low   Dizziness 05/17/2014    Priority: Low   Immunosuppressed status ([place]) 06/02/2019    Medications- reviewed and updated Current Outpatient Medications  Medication Sig Dispense Refill   alendronate (FOSAMAX) 70 MG tablet TAKE 1 TABLET BY MOUTH  EVERY 7 DAYS WITH A FULL  GLASS OF WATER ON AN EMPTY  STOMACH 12 tablet 3   Biotin 5 MG CAPS Take 5 mg by mouth daily.     Calcium Carbonate-Vit D-Min (CALCIUM 1200 PO) Take 1 tablet by mouth daily.     carvedilol (COREG) 3.125 MG tablet TAKE ONE TABLET BY MOUTH TWICE A DAY 180 tablet 1   cholecalciferol (VITAMIN D) 25 MCG (1000 UNIT) tablet Take 1,000 Units by mouth daily.     Cyanocobalamin (B-12 PO) Take by mouth once a week.      levETIRAcetam (KEPPRA) 250 MG tablet TAKE 1 TABLET BY MOUTH  TWICE DAILY 180 tablet 3   MAGNESIUM PO Take by mouth.     Multiple Vitamin (MULTIVITAMIN) tablet Take 1 tablet by mouth daily.     tacrolimus (PROGRAF) 1 MG capsule Take 2 mg by mouth 2 (two) times daily. 2 MG AM AND 3 MG PM     zolpidem (AMBIEN CR) 6.25 MG CR tablet TAKE ONE TABLET BY MOUTH EVERY NIGHT AT BEDTIME AS NEEDED FOR SLEEP 30 tablet 5   zolpidem (AMBIEN) 5 MG tablet Take 1  tablet (5 mg total) by mouth at bedtime as needed for sleep. Trial of this instead of extended release ambien due to sleep onset issues. 30 tablet 5   No current facility-administered medications for this visit.     Objective:  Ht 5' (1.524 m)   LMP  (LMP Unknown)   BMI 20.90 kg/m  self reported vitals Gen: NAD, resting comfortably Lungs: nonlabored, normal respiratory rate  Skin: appears dry, no obvious rash  Due to covid preferred video visit at this time    Assessment and Plan   # history of liver transplant due to hepatic cirrhosis -had visit with Dr. Loletha Grayer -every 3 months doing labs at [place]- LFTs have been well  controlled - some loose stools may try probiotic but going to run by Dr. Loletha Grayer -cirrhosis in remission after transplant- continue close follow up with [facility] and LFT follow up. Remains on tacroliumus  # Anemia S:patient continues to get bloodwork set up through [facility] and done at [facility]. Anemia has been stable in 10s and 11s at least since 2020. Folate rbc, b12, iron stores reassuring in the past. Fecal occult blood testing also negative in the past.  Doing b12 once a week- levels high last visit- denies having history of true low. Mcv normal.  Lab Results  Component Value Date   WBC 4.7 05/09/2021   HGB 11.3 (A) 05/09/2021   HCT 30 (A) 05/09/2021   MCV 98.9 06/10/2020   PLT 197 05/09/2021  A/P: chronic anemia since at least 2017 overall stable- suspect anemia of chronic disease- continue to monitor.    # Insomnia S:medication- reduced ambien at her age to 6.25 mg XR from 12.5 mg in the past. Trying to turn off tv an hour before bed (other than low music and does not look at tv). She does not drive and no driving issues.  -trazodone worsened liver function in past.  -also does melatonin at times- still doesn't sleep great.  -getting to bed around 10 or 11 then sometimes not asleep until 3-4- still max sleep 3-4 hours moreso 1-2  hours. Slept better on 12.5 mg ER. Takes ambien before getting to bed - not napping in daytime.  A/P: poor control despite ambien 6.25 mg XR- discussed trialing off completely vs trial ambien 5 mg IR due to sleep onset being bigger issue.    # Covid 19 vaccination- did get 4 immunizations since last visit. Still being cautious -family will send me the dates  # seizures- no recurrence on keppra 250 mg BID- continue current meds- controlled  #mitral valve insufficiency and aortic valve insufficiency- family reports she is not a candidate for treatment and cardiology has preferred to keep her on carvedilol. Continues close follow up with Dr. Johnsie Cancel  #CKD III- stable  on most recent check with [facility]- continue to monitor. No nsaids- continue to avoid  #osteoporosis- compliant with fosamax, calcium, vitamin D. Last check 06/19/18. Declines dexa for now- they will reconsider net year  Recommended follow up:  Return in about 1 year (around 07/03/2022) for physical or sooner if needed. Future Appointments  Date Time Provider [place]  09/15/2021  9:15 AM Josue Hector, MD [facility]   Lab/Order associations:   ICD-10-CM   1. Primary insomnia  F51.01     2. Seizure disorder ([place])  G40.909     3. Liver replaced by transplant ([place])  Z94.4     4. Cirrhosis of liver without ascites, unspecified hepatic cirrhosis type ([place])  K74.60     5. Age-related osteoporosis without current pathological fracture  M81.0     6. Stage 3 chronic kidney disease, unspecified whether stage 3a or 3b CKD (H[facility])  N18.30     7. Anemia, unspecified type  D64.9       Meds ordered this encounter  Medications   zolpidem (AMBIEN) 5 MG tablet    Sig: Take 1 tablet (5 mg total) by mouth at bedtime as needed for sleep. Trial of this instead of extended release ambien due to sleep onset issues.    Dispense:  30 tablet    Refill:  5     Return precautions advised.  Garret Reddish, MD

## 2021-07-03 NOTE — Patient Instructions (Addendum)
Health Maintenance Due  Topic Date Due   Zoster Vaccines- Shingrix (1 of 2) Never done   COVID-19 Vaccine (3 - Pfizer risk series) 08/22/2020   INFLUENZA VACCINE  07/03/2021    Recommended follow up: Return in about 1 year (around 07/03/2022) for physical or sooner if needed.

## 2021-07-18 ENCOUNTER — Other Ambulatory Visit: Payer: Self-pay | Admitting: Family Medicine

## 2021-07-29 ENCOUNTER — Other Ambulatory Visit: Payer: Self-pay | Admitting: Family Medicine

## 2021-07-29 ENCOUNTER — Encounter: Payer: Self-pay | Admitting: Family Medicine

## 2021-08-22 DIAGNOSIS — Z944 Liver transplant status: Secondary | ICD-10-CM | POA: Diagnosis not present

## 2021-08-22 DIAGNOSIS — D849 Immunodeficiency, unspecified: Secondary | ICD-10-CM | POA: Diagnosis not present

## 2021-09-02 ENCOUNTER — Other Ambulatory Visit: Payer: Self-pay | Admitting: Cardiovascular Disease

## 2021-09-11 NOTE — Progress Notes (Signed)
Cardiology Office Note:    Date:  09/15/2021   ID:  [name], DOB 20-Apr-1932, MRN [id number]  PCP:  Marin Olp, MD  Cardiologist:  Dr. Jenkins Rouge   Electrophysiologist:  N/a Hepatologist/Live Transplant at [facility]: Dr. Enis Gash Neurologist: Dr. Krista Blue  Referring MD: Marin Olp, MD   No chief complaint on file.   History of Present Illness:    [name] is a 87 y.o. female with a hx of valvular heart disease with mild AR/AS and severe MR  , hepatic cirrhosis s/p liver transplant, seizures  Tells fascinating story of getting liver transplant at age 58 at [facility] after having cardiopulmonary syndrome with cirrhosis from ETOH abuse  Husband passed in 2020  The patient denies chest pain, shortness of breath, syncope, orthopnea, PND or significant pedal edema.   Echo 08/05/19 reviewed: MR now severe with mild AS/AR   She is not symptomatic Due to severe calcium and MAC not a mitral clip candidate Long discussion about progression of valve disease She lives Independently in apartment  Still walks 2x/day but no longer dancing to [place] Daughter and son look in on her Not driving   Also discussed risk of deterioration due to afib. Surprisingly she is in sinus despite age , severe LAE and severe MR. Could consider addition of lasix and beta blocker if she were to have afib. She is euvolemic at this time and for her age functional class one Monitor done 09/28/19 showed no PAF  Her oldest daughter was unvaccinated and died of COVID 2021  She has had 4 immunizations   No cardiac complaints   Past Medical History:  Diagnosis Date   Cirrhosis [facility])    s/p Liver Transplant - follow at [facility]   History of cardiac catheterization    a. LHC 06/2006: EF 65-70%, pLAD 20%   History of GI bleed    patient denies   Mitral valve insufficiency and aortic valve insufficiency    a. Echo 3/14:  Mild LVH, EF 55-65%, Gr 1 DD, moderate AI, MAC, mild mitral stenosis, mild to mod MR, mod  LAE, PASP 34 mmHg //  b. Echo 6/15: Mild LVH, EF 55-60%, Gr 2 DD, mode AI, MAC, trivial mitral stenosis (mean gradient 5 mmHg), mod MR, mod LAE, PASP 30 mmHg  //  c. Echo 6/16: EF 55-60%, no RWMA, Gr 2 DD, mod AI, mildly dilated Asc aorta (38 mm), MAC, mod MS, moderate MR, PASP 31 mmHg    Osteoporosis    Seizure disorder ([place])    followed by Neuro    Past Surgical History:  Procedure Laterality Date   LIVER TRANSPLANT  08/08    Current Medications: Outpatient Medications Prior to Visit  Medication Sig Dispense Refill   alendronate (FOSAMAX) 70 MG tablet TAKE 1 TABLET BY MOUTH  EVERY 7 DAYS WITH A FULL  GLASS OF WATER ON AN EMPTY  STOMACH 12 tablet 3   Biotin 5 MG CAPS Take 5 mg by mouth daily.     Calcium Carbonate-Vit D-Min (CALCIUM 1200 PO) Take 1 tablet by mouth daily.     carvedilol (COREG) 3.125 MG tablet TAKE ONE TABLET BY MOUTH TWICE A DAY 180 tablet 1   Cyanocobalamin (B-12 PO) Take by mouth once a week.      levETIRAcetam (KEPPRA) 250 MG tablet TAKE 1 TABLET BY MOUTH  TWICE DAILY 180 tablet 3   MAGNESIUM PO Take by mouth.     Multiple Vitamin (MULTIVITAMIN) tablet Take  1 tablet by mouth daily.     tacrolimus (PROGRAF) 1 MG capsule Take 2 mg by mouth 2 (two) times daily. 2 MG AM AND 3 MG PM     zolpidem (AMBIEN CR) 6.25 MG CR tablet TAKE ONE TABLET BY MOUTH EVERY NIGHT AT BEDTIME AS NEEDED FOR SLEEP 30 tablet 5   zolpidem (AMBIEN) 5 MG tablet Take 1 tablet (5 mg total) by mouth at bedtime as needed for sleep. Trial of this instead of extended release ambien due to sleep onset issues. 30 tablet 5   cholecalciferol (VITAMIN D) 25 MCG (1000 UNIT) tablet Take 1,000 Units by mouth daily. (Patient not taking: Reported on 09/15/2021)     No facility-administered medications prior to visit.      Allergies:   Nsaids and Codeine   Social History   Socioeconomic History   Marital status: Widowed    Spouse name: [name]    Number of children: 3   Years of education: 12th   Highest  education level: Not on file  Occupational History   Occupation: Retired  Tobacco Use   Smoking status: Former   Smokeless tobacco: Never  Scientific laboratory technician Use: Never used  Substance and Sexual Activity   Alcohol use: No    Comment: h/o alcohol abuse   Drug use: No   Sexual activity: Not on file  Other Topics Concern   Not on file  Social History Narrative   Patient lives at home with husband [name].    Daughter lives close and extremely supportive      Patient has 3 children. 4 grandchildren. No greatgrandchildren.    Patient has a high school education level.    Patient is retired from [employer], cared for her children      Hobbies: dancing ([place] singing group)-fewer and fewer get togethers   Social Determinants of Radio broadcast assistant Strain: Not on file  Food Insecurity: Not on file  Transportation Needs: Not on file  Physical Activity: Not on file  Stress: Not on file  Social Connections: Not on file     Family History:  The patient's family history includes Heart attack (age of onset: 91) in her mother.   ROS:   Please see the history of present illness.    Review of Systems  Hematologic/Lymphatic: Bruises/bleeds easily.  All other systems reviewed and are negative.   Physical Exam:    VS:  BP 110/64   Pulse 86   Ht 5' (1.524 m)   Wt 106 lb 9.6 oz (48.4 kg)   LMP  (LMP Unknown)   SpO2 92%   BMI 20.82 kg/m     Wt Readings from Last 3 Encounters:  09/15/21 106 lb 9.6 oz (48.4 kg)  03/03/21 107 lb (48.5 kg)  08/30/20 109 lb 9.6 oz (49.7 kg)     Affect appropriate Healthy:  appears stated age HEENT: normal Neck supple with no adenopathy JVP normal no bruits no thyromegaly Lungs clear with no wheezing and good diaphragmatic motion Heart:  S1/S2 AR and MR  murmur, no rub, gallop or click PMI normal Abdomen: benighn, BS positve, no tenderness, no AAA no bruit.  No HSM or HJR Distal pulses intact with no bruits No edema Neuro  non-focal Skin warm and dry No muscular weakness   Studies/Labs Reviewed:    EKG:   2018-07-18 SR rate 66 RBBB 08/05/19 SR RBBB old rate 72 stable 09/15/2021 NSR rate 86 RBBB PaCls   Recent  Labs: 05/09/2021: BUN 18; Creatinine 1.0; Hemoglobin 11.3; Platelets 197; Potassium 4.7; Sodium 136   Recent Lipid Panel    Component Value Date/Time   CHOL 164 03/03/2014 0907   TRIG 97.0 03/03/2014 0907   HDL 69.10 03/03/2014 0907   CHOLHDL 2 03/03/2014 0907   VLDL 19.4 03/03/2014 0907   LDLCALC 76 03/03/2014 0907    Additional studies/ records that were reviewed today include:   Echo 08/05/19  Normal EF mild AS/AR severe MR  LHC 7/07 EF 65-70% LM normal Proximal LAD irregularity 20%  Long Term monitor 09/28/19  SR average HR 72 rare PAC/PVC < 1%  Short runs atrial tachycardia longest only 13 beats No PaF   ASSESSMENT:    Severe MR   PLAN:    In order of problems listed above:  1. AV Disease:  Stable mild AR AS by TTE 08/29/19  2. MV Disease: severe MR not candidate for mitral clip observe  Risk of PAF discussed  3. Liver Transplant: followed at [facility] on prograf 4. Seizure: on keppra none since transplant f/u Dr Krista Blue  5. Varicose Veins:  Encouraged use of compression stockings elevate legs latter in day when possible 6. RBBB:  Chronic no high grade AV block   F/U with cardiology in a year   Jenkins Rouge

## 2021-09-15 ENCOUNTER — Other Ambulatory Visit: Payer: Self-pay

## 2021-09-15 ENCOUNTER — Ambulatory Visit: Payer: Medicare Other | Admitting: Cardiovascular Disease

## 2021-09-15 ENCOUNTER — Encounter: Payer: Self-pay | Admitting: Cardiovascular Disease

## 2021-09-15 VITALS — BP 110/64 | HR 86 | Ht 60.0 in | Wt 106.6 lb

## 2021-09-15 DIAGNOSIS — I4510 Unspecified right bundle-branch block: Secondary | ICD-10-CM

## 2021-09-15 DIAGNOSIS — I340 Nonrheumatic mitral (valve) insufficiency: Secondary | ICD-10-CM | POA: Diagnosis not present

## 2021-09-15 NOTE — Patient Instructions (Signed)
Medication Instructions:  *If you need a refill on your cardiac medications before your next appointment, please call your pharmacy*  Lab Work: If you have labs (blood work) drawn today and your tests are completely normal, you will receive your results only by: MyChart Message (if you have MyChart) OR A paper copy in the mail If you have any lab test that is abnormal or we need to change your treatment, we will call you to review the results.  Testing/Procedures: None ordered today.  Follow-Up: At [facility], you and your health needs are our priority.  As part of our continuing mission to provide you with exceptional heart care, we have created designated Provider Care Teams.  These Care Teams include your primary Cardiologist (physician) and Advanced Practice Providers (APPs -  Physician Assistants and Nurse Practitioners) who all work together to provide you with the care you need, when you need it.  We recommend signing up for the patient portal called "MyChart".  Sign up information is provided on this After Visit Summary.  MyChart is used to connect with patients for Virtual Visits (Telemedicine).  Patients are able to view lab/test results, encounter notes, upcoming appointments, etc.  Non-urgent messages can be sent to your provider as well.   To learn more about what you can do with MyChart, go to [email or web address].    Your next appointment:   6 month(s)  The format for your next appointment:   In Person  Provider:   You may see Peter Nishan, MD or one of the following Advanced Practice Providers on your designated Care Team:   Laura Ingold, NP  

## 2021-09-18 NOTE — Addendum Note (Signed)
Addended by: Jacinta Shoe on: 09/18/2021 02:55 PM   Modules accepted: Orders

## 2021-09-29 DIAGNOSIS — L239 Allergic contact dermatitis, unspecified cause: Secondary | ICD-10-CM | POA: Diagnosis not present

## 2021-10-10 ENCOUNTER — Other Ambulatory Visit: Payer: Self-pay

## 2021-10-10 ENCOUNTER — Ambulatory Visit (INDEPENDENT_AMBULATORY_CARE_PROVIDER_SITE_OTHER): Payer: Medicare Other

## 2021-10-10 DIAGNOSIS — Z23 Encounter for immunization: Secondary | ICD-10-CM

## 2021-10-18 ENCOUNTER — Other Ambulatory Visit: Payer: Self-pay | Admitting: Family Medicine

## 2021-11-22 DIAGNOSIS — Z944 Liver transplant status: Secondary | ICD-10-CM | POA: Diagnosis not present

## 2021-11-22 DIAGNOSIS — D849 Immunodeficiency, unspecified: Secondary | ICD-10-CM | POA: Diagnosis not present

## 2022-01-19 DIAGNOSIS — H524 Presbyopia: Secondary | ICD-10-CM | POA: Diagnosis not present

## 2022-01-19 DIAGNOSIS — H25813 Combined forms of age-related cataract, bilateral: Secondary | ICD-10-CM | POA: Diagnosis not present

## 2022-01-19 DIAGNOSIS — H353132 Nonexudative age-related macular degeneration, bilateral, intermediate dry stage: Secondary | ICD-10-CM | POA: Diagnosis not present

## 2022-01-19 DIAGNOSIS — H3554 Dystrophies primarily involving the retinal pigment epithelium: Secondary | ICD-10-CM | POA: Diagnosis not present

## 2022-01-20 ENCOUNTER — Other Ambulatory Visit: Payer: Self-pay | Admitting: Family Medicine

## 2022-01-27 ENCOUNTER — Encounter: Payer: Self-pay | Admitting: Cardiovascular Disease

## 2022-01-27 DIAGNOSIS — R000 Tachycardia, unspecified: Secondary | ICD-10-CM

## 2022-01-27 DIAGNOSIS — R002 Palpitations: Secondary | ICD-10-CM

## 2022-01-29 ENCOUNTER — Ambulatory Visit (INDEPENDENT_AMBULATORY_CARE_PROVIDER_SITE_OTHER): Payer: Medicare Other

## 2022-01-29 DIAGNOSIS — R002 Palpitations: Secondary | ICD-10-CM

## 2022-01-29 DIAGNOSIS — R000 Tachycardia, unspecified: Secondary | ICD-10-CM

## 2022-01-29 NOTE — Progress Notes (Unsigned)
Enrolled for [employer] to mail a ZIO XT long term holter monitor to the patients address on file.  

## 2022-02-12 DIAGNOSIS — R000 Tachycardia, unspecified: Secondary | ICD-10-CM | POA: Diagnosis not present

## 2022-02-12 DIAGNOSIS — R002 Palpitations: Secondary | ICD-10-CM

## 2022-02-26 DIAGNOSIS — R000 Tachycardia, unspecified: Secondary | ICD-10-CM | POA: Diagnosis not present

## 2022-02-26 DIAGNOSIS — R002 Palpitations: Secondary | ICD-10-CM | POA: Diagnosis not present

## 2022-02-27 DIAGNOSIS — D849 Immunodeficiency, unspecified: Secondary | ICD-10-CM | POA: Diagnosis not present

## 2022-02-27 DIAGNOSIS — Z944 Liver transplant status: Secondary | ICD-10-CM | POA: Diagnosis not present

## 2022-03-07 ENCOUNTER — Other Ambulatory Visit: Payer: Self-pay | Admitting: Cardiovascular Disease

## 2022-03-31 NOTE — Progress Notes (Signed)
?Cardiology Office Note:   ? ?Date:  04/05/2022  ? ?ID:  [name], DOB Apr 02, 1932, MRN [id number] ? ?PCP:  Marin Olp, MD  ?Cardiologist:  Dr. Jenkins Young   ?Electrophysiologist:  N/a ?Hepatologist/Live Transplant at [facility]: Dr. Enis Gash ?Neurologist: Dr. Krista Blue ? ?Referring MD: Marin Olp, MD  ? ?No chief complaint on file. ? ? ?History of Present Illness:   ? ?[name] is a 88 y.o. female with a hx of valvular heart disease with mild AR/AS and severe MR  , hepatic cirrhosis s/p liver transplant, seizures  Tells fascinating story of getting liver transplant at age 75 at [facility] after having cardiopulmonary syndrome with cirrhosis from ETOH abuse ? ?Husband passed in 2020 ? ?The patient denies chest pain, shortness of breath, syncope, orthopnea, PND or significant pedal edema.  ? ?Echo 08/05/19 reviewed: ?MR now severe with mild AS/AR  ? ?She is not symptomatic MR due to severe calcium and MAC not a mitral clip candidate Long discussion about progression of valve disease She lives Independently in apartment  Still walks 2x/day but no longer dancing to [place] Daughter and son look in on her Not driving  ? ?Also discussed risk of deterioration due to afib. Surprisingly she is in sinus despite age , severe LAE and severe MR. Could consider addition of lasix and beta blocker if she were to have afib. She is euvolemic at this time and for her age functional class one Monitor done 09/28/19 and 01/29/22  showed no PAF ? ?Her oldest daughter was unvaccinated and died of COVID 2021  ?She has had 4 immunizations  ? ?She does seem to have developed worse functional status this past year with more exertional dyspnea  ?No volume overload and no afib  ? ?Past Medical History:  ?Diagnosis Date  ? Cirrhosis ([place])   ? s/p Liver Transplant - follow at [facility]  ? History of cardiac catheterization   ? a. LHC 06/2006: EF 65-70%, pLAD 20%  ? History of GI bleed   ? patient denies  ? Mitral valve insufficiency and aortic  valve insufficiency   ? a. Echo 3/14:  Mild LVH, EF 55-65%, Gr 1 DD, moderate AI, MAC, mild mitral stenosis, mild to mod MR, mod LAE, PASP 34 mmHg //  b. Echo 6/15: Mild LVH, EF 55-60%, Gr 2 DD, mode AI, MAC, trivial mitral stenosis (mean gradient 5 mmHg), mod MR, mod LAE, PASP 30 mmHg  //  c. Echo 6/16: EF 55-60%, no RWMA, Gr 2 DD, mod AI, mildly dilated Asc aorta (38 mm), MAC, mod MS, moderate MR, PASP 31 mmHg   ? Osteoporosis   ? Seizure disorder ([place])   ? followed by Neuro  ? ? ?Past Surgical History:  ?Procedure Laterality Date  ? LIVER TRANSPLANT  08/08  ? ? ?Current Medications: ?Outpatient Medications Prior to Visit  ?Medication Sig Dispense Refill  ? alendronate (FOSAMAX) 70 MG tablet TAKE 1 TABLET BY MOUTH  EVERY 7 DAYS WITH A FULL  GLASS OF WATER ON AN EMPTY  STOMACH 12 tablet 3  ? Biotin 5 MG CAPS Take 5 mg by mouth daily.    ? Calcium Carbonate-Vit D-Min (CALCIUM 1200 PO) Take 1 tablet by mouth daily.    ? carvedilol (COREG) 3.125 MG tablet TAKE ONE TABLET BY MOUTH TWICE A DAY 180 tablet 1  ? cholecalciferol (VITAMIN D) 25 MCG (1000 UNIT) tablet Take 1,000 Units by mouth daily.    ? Cyanocobalamin (B-12 PO) Take  by mouth once a week.     ? levETIRAcetam (KEPPRA) 250 MG tablet TAKE 1 TABLET BY MOUTH  TWICE DAILY 180 tablet 3  ? MAGNESIUM PO Take by mouth.    ? Multiple Vitamin (MULTIVITAMIN) tablet Take 1 tablet by mouth daily.    ? tacrolimus (PROGRAF) 1 MG capsule Take 2 mg by mouth 2 (two) times daily. 2 MG AM AND 3 MG PM    ? zolpidem (AMBIEN) 5 MG tablet Take 1 tablet (5 mg total) by mouth at bedtime as needed for sleep. Trial of this instead of extended release ambien due to sleep onset issues. 30 tablet 5  ? zolpidem (AMBIEN CR) 6.25 MG CR tablet TAKE ONE TABLET BY MOUTH EVERY NIGHT AT BEDTIME AS NEEDED FOR SLEEP (Patient not taking: Reported on 04/05/2022) 30 tablet 5  ? ?No facility-administered medications prior to visit.  ?   ? ?Allergies:   Nsaids and Codeine  ? ?Social History   ? ?Socioeconomic History  ? Marital status: Widowed  ?  Spouse name: [name]   ? Number of children: 3  ? Years of education: 12th  ? Highest education level: Not on file  ?Occupational History  ? Occupation: Retired  ?Tobacco Use  ? Smoking status: Former  ? Smokeless tobacco: Never  ?Vaping Use  ? Vaping Use: Never used  ?Substance and Sexual Activity  ? Alcohol use: No  ?  Comment: h/o alcohol abuse  ? Drug use: No  ? Sexual activity: Not on file  ?Other Topics Concern  ? Not on file  ?Social History Narrative  ? Patient lives at home with husband [name].   ? Daughter lives close and extremely supportive  ?   ? Patient has 3 children. 4 grandchildren. No greatgrandchildren.   ? Patient has a high school education level.   ? Patient is retired from [employer], cared for her children  ?   ? Hobbies: dancing ([employer] [place] [employer])-fewer and fewer get togethers  ? ?Social Determinants of Health  ? ?Financial Resource Strain: Not on file  ?Food Insecurity: Not on file  ?Transportation Needs: Not on file  ?Physical Activity: Not on file  ?Stress: Not on file  ?Social Connections: Not on file  ?  ? ?Family History:  The patient's family history includes Heart attack (age of onset: 16) in her mother.  ? ?ROS:   ?Please see the history of present illness.    ?Review of Systems  ?Hematologic/Lymphatic: Bruises/bleeds easily.  All other systems reviewed and are negative. ? ? ?Physical Exam:   ? ?VS:  BP (!) 112/54   Pulse 76   Ht _0  (1.6 m)   Wt 107 lb 6.4 oz (48.7 kg)   LMP  (LMP Unknown)   SpO2 90%   BMI 19.03 kg/m?    ? ?Wt Readings from Last 3 Encounters:  ?04/05/22 107 lb 6.4 oz (48.7 kg)  ?09/15/21 106 lb 9.6 oz (48.4 kg)  ?03/03/21 107 lb (48.5 kg)  ?  ? ?Affect appropriate ?Healthy:  appears stated age ?HEENT: normal ?Neck supple with no adenopathy ?JVP normal no bruits no thyromegaly ?Lungs clear with no wheezing and good diaphragmatic motion ?Heart:  S1/S2 AR and MR  murmur, no rub, gallop or click ?PMI  normal ?Abdomen: benighn, BS positve, no tenderness, no AAA ?no bruit.  No HSM or HJR ?Distal pulses intact with no bruits ?No edema ?Neuro non-focal ?Skin warm and dry ?No muscular weakness ? ? ?Studies/Labs Reviewed:   ? ?  EKG:   07/02/18 SR rate 66 RBBB 08/05/19 SR RBBB old rate 72 stable 04/05/2022 NSR rate 86 RBBB PaCls  ? ?Recent Labs: ?05/09/2021: BUN 18; Creatinine 1.0; Hemoglobin 11.3; Platelets 197; Potassium 4.7; Sodium 136  ? ?Recent Lipid Panel ?   ?Component Value Date/Time  ? CHOL 164 03/03/2014 0907  ? TRIG 97.0 03/03/2014 0907  ? HDL 69.10 03/03/2014 0907  ? CHOLHDL 2 03/03/2014 0907  ? VLDL 19.4 03/03/2014 0907  ? [place] 76 03/03/2014 0907  ? ? ?Additional studies/ records that were reviewed today include:  ? ?Echo 08/05/19  Normal EF mild AS/AR severe MR ? ?LHC 7/07 ?EF 65-70% ?LM normal ?Proximal LAD irregularity 20% ? ?Long Term monitor 09/28/19  ?SR average HR 72 rare PAC/PVC < 1%  ?Short runs atrial tachycardia longest only 13 beats ?No PaF  ? ?ASSESSMENT:   ? ?Severe MR  ? ?PLAN:   ? ?In order of problems listed above: ? ?1. AV Disease:  Stable mild AR AS by TTE 08/29/19  ?2. MV Disease: severe MR not candidate for mitral clip update echo given worsening functional status assess EF as well  Risk of PAF discussed  ?3. Liver Transplant: [facility] at [facility] on prograf ?4. Seizure: on keppra none since transplant f/u Dr Krista Blue  ?5. Varicose Veins:  Encouraged use of compression stockings elevate legs latter in day when possible ?6. RBBB:  Chronic no high grade AV block  ? ?Update echo  ? ?F/U with cardiology in a year  ? ?[name] ? ? ? ? ? ? ?

## 2022-04-05 ENCOUNTER — Ambulatory Visit: Payer: Medicare Other | Admitting: Cardiovascular Disease

## 2022-04-05 ENCOUNTER — Encounter: Payer: Self-pay | Admitting: Cardiovascular Disease

## 2022-04-05 VITALS — BP 112/54 | HR 76 | Ht 63.0 in | Wt 107.4 lb

## 2022-04-05 DIAGNOSIS — I351 Nonrheumatic aortic (valve) insufficiency: Secondary | ICD-10-CM | POA: Diagnosis not present

## 2022-04-05 DIAGNOSIS — I340 Nonrheumatic mitral (valve) insufficiency: Secondary | ICD-10-CM

## 2022-04-05 NOTE — Patient Instructions (Signed)
Medication Instructions:  ?*If you need a refill on your cardiac medications before your next appointment, please call your pharmacy* ? ? ?Lab Work: ?If you have labs (blood work) drawn today and your tests are completely normal, you will receive your results only by: ?MyChart Message (if you have MyChart) OR ?A paper copy in the mail ?If you have any lab test that is abnormal or we need to change your treatment, we will call you to review the results. ? ?Testing/Procedures: ?Your physician has requested that you have an echocardiogram. Echocardiography is a painless test that uses sound waves to create images of your heart. It provides your doctor with information about the size and shape of your heart and how well your heart?s chambers and valves are working. This procedure takes approximately one hour. There are no restrictions for this procedure. ? ?Follow-Up: ?At [facility], you and your health needs are our priority.  As part of our continuing mission to provide you with exceptional heart care, we have created designated Provider Care Teams.  These Care Teams include your primary Cardiologist (physician) and Advanced Practice Providers (APPs -  Physician Assistants and Nurse Practitioners) who all work together to provide you with the care you need, when you need it. ? ?We recommend signing up for the patient portal called "MyChart".  Sign up information is provided on this After Visit Summary.  MyChart is used to connect with patients for Virtual Visits (Telemedicine).  Patients are able to view lab/test results, encounter notes, upcoming appointments, etc.  Non-urgent messages can be sent to your provider as well.   ?To learn more about what you can do with MyChart, go to [email or web address].   ? ?Your next appointment:   ?1 year(s) ? ?The format for your next appointment:   ?In Person ? ?Provider:   ?Jenkins Rouge, MD { ? ? ? ?Important Information About [place] ? ? ? ? ?  ?

## 2022-04-25 ENCOUNTER — Ambulatory Visit (HOSPITAL_COMMUNITY): Payer: Medicare Other | Attending: Cardiovascular Disease

## 2022-04-25 DIAGNOSIS — I351 Nonrheumatic aortic (valve) insufficiency: Secondary | ICD-10-CM | POA: Insufficient documentation

## 2022-04-25 DIAGNOSIS — I340 Nonrheumatic mitral (valve) insufficiency: Secondary | ICD-10-CM | POA: Insufficient documentation

## 2022-04-25 LAB — ECHOCARDIOGRAM COMPLETE
Area-P 1/2: 2.95 cm2
MV M vel: 5.24 m/s
MV Peak grad: 109.8 mmHg
MV VTI: 0.97 cm2
P 1/2 time: 298 msec
Radius: 0.8 cm
S' Lateral: 3.2 cm

## 2022-05-31 ENCOUNTER — Other Ambulatory Visit: Payer: Self-pay | Admitting: Family Medicine

## 2022-06-12 DIAGNOSIS — D849 Immunodeficiency, unspecified: Secondary | ICD-10-CM | POA: Diagnosis not present

## 2022-06-12 DIAGNOSIS — Z944 Liver transplant status: Secondary | ICD-10-CM | POA: Diagnosis not present

## 2022-06-19 DIAGNOSIS — Z944 Liver transplant status: Secondary | ICD-10-CM | POA: Diagnosis not present

## 2022-06-19 DIAGNOSIS — D849 Immunodeficiency, unspecified: Secondary | ICD-10-CM | POA: Diagnosis not present

## 2022-07-14 ENCOUNTER — Other Ambulatory Visit: Payer: Self-pay | Admitting: Family Medicine

## 2022-07-14 ENCOUNTER — Encounter: Payer: Self-pay | Admitting: Family Medicine

## 2022-08-03 ENCOUNTER — Encounter: Payer: Self-pay | Admitting: Family Medicine

## 2022-08-03 ENCOUNTER — Telehealth (INDEPENDENT_AMBULATORY_CARE_PROVIDER_SITE_OTHER): Payer: Medicare Other | Admitting: Family Medicine

## 2022-08-03 DIAGNOSIS — Z944 Liver transplant status: Secondary | ICD-10-CM

## 2022-08-03 DIAGNOSIS — N183 Chronic kidney disease, stage 3 unspecified: Secondary | ICD-10-CM | POA: Diagnosis not present

## 2022-08-03 DIAGNOSIS — G40909 Epilepsy, unspecified, not intractable, without status epilepticus: Secondary | ICD-10-CM

## 2022-08-03 DIAGNOSIS — M810 Age-related osteoporosis without current pathological fracture: Secondary | ICD-10-CM

## 2022-08-03 DIAGNOSIS — K7460 Unspecified cirrhosis of liver: Secondary | ICD-10-CM | POA: Diagnosis not present

## 2022-08-03 NOTE — Patient Instructions (Addendum)
  Schedule your bone density test at check out desk.  - located [address] across the [address] from [place] - in the basement - you DO NEED an appointment for the bone density tests.    Recommended follow up: Return in about 1 year (around 08/04/2023) for followup or sooner if needed.Schedule b4 you leave.

## 2022-08-03 NOTE — Progress Notes (Signed)
Phone [phone] Virtual visit via Video note   Subjective:  Chief complaint: Chief Complaint  Patient presents with   Medication Refill   This visit type was conducted due to national recommendations for restrictions regarding the COVID-19 Pandemic (e.g. social distancing).  This format is felt to be most appropriate for this patient at this time balancing risks to patient and risks to population by having him in for in person visit.  No physical exam was performed (except for noted visual exam or audio findings with Telehealth visits).    Our team/I connected with [name] at  3:20 PM EDT by a video enabled telemedicine application (doxy.me or caregility through epic) and verified that I am speaking with the correct person using two identifiers.  Location patient: [place] Location provider: [facility], office Persons participating in the virtual visit:  patient, daughter  Our team/I discussed the limitations of evaluation and management by telemedicine and the availability of in person appointments. In light of current covid-19 pandemic, patient also understands that we are trying to protect them by minimizing in office contact if at all possible.  The patient expressed consent for telemedicine visit and agreed to proceed. Patient understands insurance will be billed.   Past Medical History-  Patient Active Problem List   Diagnosis Date Noted   Seizure disorder ([place]) 09/21/2013    Priority: High   Liver replaced by transplant ([place]) 06/08/2010    Priority: High   Hepatic cirrhosis ([place]) 06/09/2007    Priority: High   Anemia 06/30/2020    Priority: Medium    CKD (chronic kidney disease), stage III ([place]) 05/03/2017    Priority: Medium    Mitral valve insufficiency and aortic valve insufficiency     Priority: Medium    Hyponatremia 05/17/2014    Priority: Medium    Aortic insufficiency 12/03/2012    Priority: Medium    Insomnia 06/08/2010    Priority: Medium     Osteoporosis 06/09/2007    Priority: Medium    History of skin cancer 05/03/2017    Priority: Low   GERD (gastroesophageal reflux disease) 12/13/2014    Priority: Low   Dizziness 05/17/2014    Priority: Low   Immunosuppressed status ([place]) 06/02/2019    Medications- reviewed and updated Current Outpatient Medications  Medication Sig Dispense Refill   alendronate (FOSAMAX) 70 MG tablet TAKE 1 TABLET BY MOUTH  WEEKLY WITH 8 OZ OF PLAIN  WATER 30 MINUTES BEFORE  FIRST FOOD, DRINK OR MEDS.  STAY UPRIGHT FOR 30 MINS 12 tablet 3   Biotin 5 MG CAPS Take 5 mg by mouth daily.     Calcium Carbonate-Vit D-Min (CALCIUM 1200 PO) Take 1 tablet by mouth daily.     carvedilol (COREG) 3.125 MG tablet TAKE ONE TABLET BY MOUTH TWICE A DAY 180 tablet 1   cholecalciferol (VITAMIN D) 25 MCG (1000 UNIT) tablet Take 1,000 Units by mouth daily.     Cyanocobalamin (B-12 PO) Take by mouth once a week.      levETIRAcetam (KEPPRA) 250 MG tablet TAKE 1 TABLET BY MOUTH  TWICE DAILY 180 tablet 3   MAGNESIUM PO Take by mouth.     Multiple Vitamin (MULTIVITAMIN) tablet Take 1 tablet by mouth daily.     tacrolimus (PROGRAF) 1 MG capsule Take 2 mg by mouth 2 (two) times daily. 2 MG AM AND 3 MG PM     zolpidem (AMBIEN CR) 6.25 MG CR tablet TAKE ONE TABLET BY MOUTH EVERY NIGHT AT  BEDTIME AS NEEDED FOR SLEEP 30 tablet 5   No current facility-administered medications for this visit.     Objective:  LMP  (LMP Unknown)  self reported vitals Gen: NAD, resting comfortably Lungs: nonlabored, normal respiratory rate  Skin: appears dry, no obvious rash     Assessment and Plan   #History of liver transplant due to hepatic cirrhosis- continues to follow with [facility] with q3 month labs. Cirrhosis in remission after transplant  - with upcoming covid shot would like to check LFTs afterwards- these were ordered today and they can call to schedule perhaps 3 weeks out for lab only viist  #Anemia- chronic anemia since 2017 but stable  with [facility] labs  # Insomnia S:Medication: ambien 6.25 mg CR- thankfully no falls or vivid dreams. Still imperfect sleep -needs to fail ramelteon and belsomra -failed trazodone: LFT elevations. Ambien not effective.  A/P: imperfect control but with prior LFT elevations with changes- patient/daughter very hesitant to change plus ambien IR not effective. Ramelteon and belsomra may not help- she wants to hold off - continue current meds  # Seizure disorder S:no seizure activity on keppra 250 mg BID  A/P: no recurrence/controlled- continue current meds  #mitral valve insufficiency and aortic valve insufficiency- follows with Dr. Johnsie Cancel and gets echos every 3-5 years.    #CKD III- stable on recent checks with [facility] with GFR in 50s.   #Osteoporosis- compliant with fosamax (was on 2009-2014, restarted in 2017 with worsening bone density) , calcium, vitamin D- - in 2017 we had no prior bone density and with osteoporosis levels on dexa opted to restart fosamax- she has now been on this 5-6 years and think its reasonable to get another bone density and if stable or improved trial off for 2 years then repeat dexa -dexa in 2019 was slightly worse and if worse consider staying on med vs endocrine referral.  -hopefully stable- update DEXA- for now continue current meds  Recommended follow up: Return in about 1 year (around 08/04/2023) for followup or sooner if needed.Schedule b4 you leave. -patient preference for quality of life- if in person can do cpe. Holding off on lipids due to fact she would not take cholesterol medicine  Lab/Order associations:   ICD-10-CM   1. Cirrhosis of liver without ascites, unspecified hepatic cirrhosis type ([place])  K74.60 Hepatic function panel    2. Age-related osteoporosis without current pathological fracture  M81.0 DG Bone Density    3. Liver replaced by transplant ([place])  Z94.4     4. Seizure disorder ([place])  G40.909     5. Stage 3 chronic kidney disease, unspecified  whether stage 3a or 3b CKD ([place])  N18.30      Return precautions advised.  Garret Reddish, MD

## 2022-08-24 ENCOUNTER — Encounter: Payer: Self-pay | Admitting: Family Medicine

## 2022-08-28 ENCOUNTER — Encounter: Payer: Self-pay | Admitting: Family Medicine

## 2022-08-28 ENCOUNTER — Ambulatory Visit: Payer: Medicare Other | Admitting: Family Medicine

## 2022-08-28 VITALS — BP 102/60 | HR 82 | Temp 98.7°F | Ht 63.0 in | Wt 114.0 lb

## 2022-08-28 DIAGNOSIS — M810 Age-related osteoporosis without current pathological fracture: Secondary | ICD-10-CM

## 2022-08-28 DIAGNOSIS — R5383 Other fatigue: Secondary | ICD-10-CM | POA: Diagnosis not present

## 2022-08-28 DIAGNOSIS — D849 Immunodeficiency, unspecified: Secondary | ICD-10-CM

## 2022-08-28 DIAGNOSIS — K7460 Unspecified cirrhosis of liver: Secondary | ICD-10-CM | POA: Diagnosis not present

## 2022-08-28 DIAGNOSIS — Z944 Liver transplant status: Secondary | ICD-10-CM

## 2022-08-28 DIAGNOSIS — G40909 Epilepsy, unspecified, not intractable, without status epilepticus: Secondary | ICD-10-CM

## 2022-08-28 NOTE — Progress Notes (Addendum)
Phone [phone] In person visit   Subjective:   [name] is a 88 y.o. year old very pleasant female patient who presents for/with See problem oriented charting Chief Complaint  Patient presents with   Fatigue    Pt daughter states pt has been experiencing more fatigue in the past week    Past Medical History-  Patient Active Problem List   Diagnosis Date Noted   Seizure disorder ([place]) 09/21/2013    Priority: High   Liver replaced by transplant ([place]) 06/08/2010    Priority: High   Hepatic cirrhosis ([place]) 06/09/2007    Priority: High   Anemia 06/30/2020    Priority: Medium    CKD (chronic kidney disease), stage III ([place]) 05/03/2017    Priority: Medium    Mitral valve insufficiency and aortic valve insufficiency     Priority: Medium    Hyponatremia 05/17/2014    Priority: Medium    Aortic insufficiency 12/03/2012    Priority: Medium    Insomnia 06/08/2010    Priority: Medium    Osteoporosis 06/09/2007    Priority: Medium    History of skin cancer 05/03/2017    Priority: Low   GERD (gastroesophageal reflux disease) 12/13/2014    Priority: Low   Dizziness 05/17/2014    Priority: Low   Immunosuppressed status ([place]) 06/02/2019    Medications- reviewed and updated Current Outpatient Medications  Medication Sig Dispense Refill   alendronate (FOSAMAX) 70 MG tablet TAKE 1 TABLET BY MOUTH  WEEKLY WITH 8 OZ OF PLAIN  WATER 30 MINUTES BEFORE  FIRST FOOD, DRINK OR MEDS.  STAY UPRIGHT FOR 30 MINS 12 tablet 3   Biotin 5 MG CAPS Take 5 mg by mouth daily.     Calcium Carbonate-Vit D-Min (CALCIUM 1200 PO) Take 1 tablet by mouth daily.     carvedilol (COREG) 3.125 MG tablet TAKE ONE TABLET BY MOUTH TWICE A DAY 180 tablet 1   cholecalciferol (VITAMIN D) 25 MCG (1000 UNIT) tablet Take 1,000 Units by mouth daily.     Cyanocobalamin (B-12 PO) Take by mouth once a week.      levETIRAcetam (KEPPRA) 250 MG tablet TAKE 1 TABLET BY MOUTH  TWICE DAILY 180 tablet 3   MAGNESIUM PO  Take by mouth.     Multiple Vitamin (MULTIVITAMIN) tablet Take 1 tablet by mouth daily.     tacrolimus (PROGRAF) 1 MG capsule Take 2 mg by mouth 2 (two) times daily. 2 MG AM AND 3 MG PM     zolpidem (AMBIEN CR) 6.25 MG CR tablet TAKE ONE TABLET BY MOUTH EVERY NIGHT AT BEDTIME AS NEEDED FOR SLEEP 30 tablet 5   No current facility-administered medications for this visit.     Objective:  BP 102/60   Pulse 82   Temp 98.7 F (37.1 C)   Ht '5\' 3"'$  (1.6 m)   Wt 114 lb (51.7 kg)   LMP  (LMP Unknown)   SpO2 100%   BMI 20.19 kg/m  Gen: NAD, resting comfortably CV: RRR stable systolic murmur Lungs: CTAB no crackles, wheeze, rhonchi Abdomen: soft/nontender/nondistended/normal bowel sounds. No rebound or guarding.  Ext: trace edema Skin: warm, dry Neuro: hard of hearing  EKG: sinus rhythm with rate 82, right axis, normal intervals other than prolonged QRS, possible right verticular hypertrophy, right bundle branch block- otherwise no st or t wave changes. Largely unchanged from 09/15/21 EKG     Assessment and Plan    # Fatigue S: Patient with significant fatigue over the  last week. At baseline has fatigue from poor sleep but this is far worse. Scheduled October 7th for flu shot and November will have covid shot- but has not had any recent new medications, vaccinations, other changes in daily activity. 2 weeks ago slipped slowly off toilet after misjudgint (had to scoot out into bedroom and prop self up- did take energy- no injury and did not hit head) - symptoms still ditn start until a week later. No urinary issues- such as pain or frequency. No chest pain or shortness of breath reported. No recent diarrhea issues more than normal- perhaps every other week- may trial low grade electrolyte replacement  A/P: Fatigue of unclear cause-no obvious underlying illness.  Could be exacerbation of chronic conditions/chronic illness-definitely need to update liver function with history of cirrhosis.  Also  evaluate for anemia which have been present in the past.  Also check for electrolyte imbalance given diarrhea issues.  Finally check thyroid which could cause fatigue-no obvious other symptoms related to this. EKG was largely reassuring/stable and no clear cardiac conditions - if blood work is unrevealing-consider trial off of carvedilol-appears and started in 2020 due to palpitations-no atrial fibrillation was ever detected.  She is not having palpitations at present so we would have to monitor off meds   #Cirrhosis- has been stable on prograf after liver transplant (has been stbale)- update LFTs to ensure stability- continue current meds and follow up with [facility] -immunosuppressed status noted but no clear infection found  #Seizure disorder- no sign of recurrence on keppra '250mg'$  - doubt seizure reltaed fatigue  #osteoporosis- could have low D- check with labs to make sure not contributing to fatigue  Recommended follow up: Return for as needed for new, worsening, persistent symptoms. Future Appointments  Date Time Provider [place]  11/15/2022  4:00 PM [facility]-DG DEXA 1 [facility]DG LB-DG   Lab/Order associations:   ICD-10-CM   1. Other fatigue  R53.83 EKG 12-Lead    CBC with Differential/Platelet    Comprehensive metabolic panel    TSH    2. Fatigue, unspecified type  R53.83     3. Age-related osteoporosis without current pathological fracture  M81.0 VITAMIN D 25 Hydroxy (Vit-D Deficiency, Fractures)    4. Cirrhosis of liver without ascites, unspecified hepatic cirrhosis type ([place])  K74.60     5. Seizure disorder ([place])  G40.909     6. Liver replaced by transplant ([place])  Z94.4     7. Immunosuppressed status ([place]) Chronic D84.9       No orders of the defined types were placed in this encounter.   Return precautions advised.  Garret Reddish, MD

## 2022-08-28 NOTE — Patient Instructions (Addendum)
Please stop by lab before you go If you have mychart- we will send your results within 3 business days of [place] receiving them.  If you do not have mychart- we will call you about results within 5 business days of [place] receiving them.  *please also note that you will see labs on mychart as soon as they post. I will later go in and write notes on them- will say "notes from Dr. Yong Channel"   Let us know if new or worsening symptoms Recommended follow up: Return for as needed for new, worsening, persistent symptoms.

## 2022-08-29 ENCOUNTER — Other Ambulatory Visit: Payer: Self-pay | Admitting: Family Medicine

## 2022-08-29 DIAGNOSIS — D539 Nutritional anemia, unspecified: Secondary | ICD-10-CM

## 2022-08-29 DIAGNOSIS — E875 Hyperkalemia: Secondary | ICD-10-CM

## 2022-08-29 LAB — COMPREHENSIVE METABOLIC PANEL
ALT: 12 U/L (ref 0–35)
AST: 19 U/L (ref 0–37)
Albumin: 3.6 g/dL (ref 3.5–5.2)
Alkaline Phosphatase: 72 U/L (ref 39–117)
BUN: 46 mg/dL — ABNORMAL HIGH (ref 6–23)
CO2: 34 mEq/L — ABNORMAL HIGH (ref 19–32)
Calcium: 10.6 mg/dL — ABNORMAL HIGH (ref 8.4–10.5)
Chloride: 100 mEq/L (ref 96–112)
Creatinine, Ser: 1.57 mg/dL — ABNORMAL HIGH (ref 0.40–1.20)
GFR: 29.42 mL/min — ABNORMAL LOW (ref >60.00)
Glucose, Bld: 115 mg/dL — ABNORMAL HIGH (ref 70–99)
Potassium: 6.2 mEq/L (ref 3.5–5.1)
Sodium: 139 mEq/L (ref 135–145)
Total Bilirubin: 0.9 mg/dL (ref 0.2–1.2)
Total Protein: 7.4 g/dL (ref 6.0–8.3)

## 2022-08-29 LAB — CBC WITH DIFFERENTIAL/PLATELET
Basophils Absolute: 0.1 10*3/uL (ref 0.0–0.1)
Basophils Relative: 1.3 % (ref 0.0–3.0)
Eosinophils Absolute: 0.2 10*3/uL (ref 0.0–0.7)
Eosinophils Relative: 3 % (ref 0.0–5.0)
HCT: 43.9 % (ref 36.0–46.0)
Hemoglobin: 13.9 g/dL (ref 12.0–15.0)
Lymphocytes Relative: 18.5 % (ref 12.0–46.0)
Lymphs Abs: 1 10*3/uL (ref 0.7–4.0)
MCHC: 31.7 g/dL (ref 30.0–36.0)
MCV: 105.3 fl — ABNORMAL HIGH (ref 78.0–100.0)
Monocytes Absolute: 0.4 10*3/uL (ref 0.1–1.0)
Monocytes Relative: 7.1 % (ref 3.0–12.0)
Neutro Abs: 3.6 10*3/uL (ref 1.4–7.7)
Neutrophils Relative %: 70.1 % (ref 43.0–77.0)
Platelets: 220 10*3/uL (ref 150.0–400.0)
RBC: 4.17 Mil/uL (ref 3.87–5.11)
RDW: 17.2 % — ABNORMAL HIGH (ref 11.5–15.5)
WBC: 5.2 10*3/uL (ref 4.0–10.5)

## 2022-08-29 LAB — TSH: TSH: 1.01 u[IU]/mL (ref 0.35–5.50)

## 2022-08-29 LAB — VITAMIN D 25 HYDROXY (VIT D DEFICIENCY, FRACTURES): VITD: 47.1 ng/mL (ref 30.00–100.00)

## 2022-08-30 ENCOUNTER — Inpatient Hospital Stay (HOSPITAL_COMMUNITY)
Admission: EM | Admit: 2022-08-30 | Discharge: 2022-09-04 | DRG: 291 | Disposition: A | Discharge status: Hospice | Payer: Medicare Other | Source: Ambulatory Visit | Attending: Internal Medicine | Admitting: Internal Medicine

## 2022-08-30 ENCOUNTER — Emergency Department (HOSPITAL_COMMUNITY): Payer: Medicare Other

## 2022-08-30 ENCOUNTER — Encounter (HOSPITAL_COMMUNITY): Payer: Self-pay

## 2022-08-30 ENCOUNTER — Other Ambulatory Visit (INDEPENDENT_AMBULATORY_CARE_PROVIDER_SITE_OTHER): Payer: Medicare Other

## 2022-08-30 ENCOUNTER — Telehealth: Payer: Self-pay | Admitting: Family Medicine

## 2022-08-30 DIAGNOSIS — K7460 Unspecified cirrhosis of liver: Secondary | ICD-10-CM | POA: Diagnosis not present

## 2022-08-30 DIAGNOSIS — Z515 Encounter for palliative care: Secondary | ICD-10-CM

## 2022-08-30 DIAGNOSIS — I130 Hypertensive heart and chronic kidney disease with heart failure and stage 1 through stage 4 chronic kidney disease, or unspecified chronic kidney disease: Principal | ICD-10-CM | POA: Diagnosis present

## 2022-08-30 DIAGNOSIS — Z8249 Family history of ischemic heart disease and other diseases of the circulatory system: Secondary | ICD-10-CM

## 2022-08-30 DIAGNOSIS — J9601 Acute respiratory failure with hypoxia: Secondary | ICD-10-CM | POA: Diagnosis not present

## 2022-08-30 DIAGNOSIS — I5033 Acute on chronic diastolic (congestive) heart failure: Secondary | ICD-10-CM | POA: Diagnosis present

## 2022-08-30 DIAGNOSIS — Z7983 Long term (current) use of bisphosphonates: Secondary | ICD-10-CM

## 2022-08-30 DIAGNOSIS — I4510 Unspecified right bundle-branch block: Secondary | ICD-10-CM | POA: Diagnosis present

## 2022-08-30 DIAGNOSIS — E875 Hyperkalemia: Secondary | ICD-10-CM | POA: Diagnosis present

## 2022-08-30 DIAGNOSIS — Z87891 Personal history of nicotine dependence: Secondary | ICD-10-CM | POA: Diagnosis not present

## 2022-08-30 DIAGNOSIS — N179 Acute kidney failure, unspecified: Secondary | ICD-10-CM | POA: Diagnosis not present

## 2022-08-30 DIAGNOSIS — N390 Urinary tract infection, site not specified: Secondary | ICD-10-CM | POA: Diagnosis not present

## 2022-08-30 DIAGNOSIS — Z886 Allergy status to analgesic agent status: Secondary | ICD-10-CM

## 2022-08-30 DIAGNOSIS — I080 Rheumatic disorders of both mitral and aortic valves: Secondary | ICD-10-CM | POA: Diagnosis not present

## 2022-08-30 DIAGNOSIS — W19XXXA Unspecified fall, initial encounter: Secondary | ICD-10-CM | POA: Diagnosis not present

## 2022-08-30 DIAGNOSIS — N1832 Chronic kidney disease, stage 3b: Secondary | ICD-10-CM | POA: Diagnosis not present

## 2022-08-30 DIAGNOSIS — I509 Heart failure, unspecified: Secondary | ICD-10-CM

## 2022-08-30 DIAGNOSIS — N183 Chronic kidney disease, stage 3 unspecified: Secondary | ICD-10-CM | POA: Diagnosis not present

## 2022-08-30 DIAGNOSIS — Z79899 Other long term (current) drug therapy: Secondary | ICD-10-CM

## 2022-08-30 DIAGNOSIS — I083 Combined rheumatic disorders of mitral, aortic and tricuspid valves: Secondary | ICD-10-CM | POA: Diagnosis present

## 2022-08-30 DIAGNOSIS — I2510 Atherosclerotic heart disease of native coronary artery without angina pectoris: Secondary | ICD-10-CM | POA: Diagnosis present

## 2022-08-30 DIAGNOSIS — R531 Weakness: Secondary | ICD-10-CM | POA: Diagnosis not present

## 2022-08-30 DIAGNOSIS — D539 Nutritional anemia, unspecified: Secondary | ICD-10-CM

## 2022-08-30 DIAGNOSIS — R7989 Other specified abnormal findings of blood chemistry: Secondary | ICD-10-CM | POA: Diagnosis present

## 2022-08-30 DIAGNOSIS — R778 Other specified abnormalities of plasma proteins: Secondary | ICD-10-CM | POA: Diagnosis not present

## 2022-08-30 DIAGNOSIS — Z20822 Contact with and (suspected) exposure to covid-19: Secondary | ICD-10-CM | POA: Diagnosis not present

## 2022-08-30 DIAGNOSIS — Z885 Allergy status to narcotic agent status: Secondary | ICD-10-CM | POA: Diagnosis not present

## 2022-08-30 DIAGNOSIS — Z944 Liver transplant status: Secondary | ICD-10-CM

## 2022-08-30 DIAGNOSIS — M810 Age-related osteoporosis without current pathological fracture: Secondary | ICD-10-CM | POA: Diagnosis not present

## 2022-08-30 DIAGNOSIS — G40909 Epilepsy, unspecified, not intractable, without status epilepticus: Secondary | ICD-10-CM | POA: Diagnosis present

## 2022-08-30 DIAGNOSIS — Z66 Do not resuscitate: Secondary | ICD-10-CM | POA: Diagnosis present

## 2022-08-30 DIAGNOSIS — J984 Other disorders of lung: Secondary | ICD-10-CM | POA: Diagnosis not present

## 2022-08-30 DIAGNOSIS — Z0389 Encounter for observation for other suspected diseases and conditions ruled out: Secondary | ICD-10-CM | POA: Diagnosis not present

## 2022-08-30 DIAGNOSIS — B9620 Unspecified Escherichia coli [E. coli] as the cause of diseases classified elsewhere: Secondary | ICD-10-CM | POA: Diagnosis present

## 2022-08-30 DIAGNOSIS — R109 Unspecified abdominal pain: Secondary | ICD-10-CM | POA: Diagnosis not present

## 2022-08-30 DIAGNOSIS — J90 Pleural effusion, not elsewhere classified: Secondary | ICD-10-CM | POA: Diagnosis not present

## 2022-08-30 DIAGNOSIS — R5383 Other fatigue: Secondary | ICD-10-CM | POA: Diagnosis not present

## 2022-08-30 DIAGNOSIS — I110 Hypertensive heart disease with heart failure: Secondary | ICD-10-CM | POA: Diagnosis not present

## 2022-08-30 DIAGNOSIS — Z7401 Bed confinement status: Secondary | ICD-10-CM | POA: Diagnosis not present

## 2022-08-30 LAB — URINALYSIS, ROUTINE W REFLEX MICROSCOPIC
Bilirubin Urine: NEGATIVE
Glucose, UA: NEGATIVE mg/dL
Ketones, ur: NEGATIVE mg/dL
Nitrite: POSITIVE — AB
Protein, ur: NEGATIVE mg/dL
Specific Gravity, Urine: 1.019 (ref 1.005–1.030)
pH: 7 (ref 5.0–8.0)

## 2022-08-30 LAB — COMPREHENSIVE METABOLIC PANEL WITH GFR
ALT: 13 U/L (ref 0–44)
AST: 19 U/L (ref 15–41)
Albumin: 3.1 g/dL — ABNORMAL LOW (ref 3.5–5.0)
Alkaline Phosphatase: 66 U/L (ref 38–126)
Anion gap: 6 (ref 5–15)
BUN: 33 mg/dL — ABNORMAL HIGH (ref 8–23)
CO2: 30 mmol/L (ref 22–32)
Calcium: 9 mg/dL (ref 8.9–10.3)
Chloride: 98 mmol/L (ref 98–111)
Creatinine, Ser: 1.24 mg/dL — ABNORMAL HIGH (ref 0.44–1.00)
GFR, Estimated: 42 mL/min — ABNORMAL LOW (ref >60)
Glucose, Bld: 92 mg/dL (ref 70–99)
Potassium: 4.7 mmol/L (ref 3.5–5.1)
Sodium: 134 mmol/L — ABNORMAL LOW (ref 135–145)
Total Bilirubin: 1.2 mg/dL (ref 0.3–1.2)
Total Protein: 6.5 g/dL (ref 6.5–8.1)

## 2022-08-30 LAB — CBC
HCT: 43.6 % (ref 36.0–46.0)
HCT: 44.5 % (ref 36.0–46.0)
Hemoglobin: 14.2 g/dL (ref 12.0–15.0)
Hemoglobin: 13.7 g/dL (ref 12.0–15.0)
MCH: 32.9 pg (ref 26.0–34.0)
MCHC: 32.5 g/dL (ref 30.0–36.0)
MCHC: 30.8 g/dL (ref 30.0–36.0)
MCV: 102.1 fl — ABNORMAL HIGH (ref 78.0–100.0)
MCV: 107 fL — ABNORMAL HIGH (ref 80.0–100.0)
Platelets: 224 10*3/uL (ref 150.0–400.0)
Platelets: 202 K/uL (ref 150–400)
RBC: 4.27 Mil/uL (ref 3.87–5.11)
RBC: 4.16 MIL/uL (ref 3.87–5.11)
RDW: 16.8 % — ABNORMAL HIGH (ref 11.5–15.5)
RDW: 15.5 % (ref 11.5–15.5)
WBC: 3.9 10*3/uL — ABNORMAL LOW (ref 4.0–10.5)
WBC: 3.9 K/uL — ABNORMAL LOW (ref 4.0–10.5)
nRBC: 0 % (ref 0.0–0.2)

## 2022-08-30 LAB — TROPONIN I (HIGH SENSITIVITY)
Troponin I (High Sensitivity): 34 ng/L — ABNORMAL HIGH (ref <18)
Troponin I (High Sensitivity): 31 ng/L — ABNORMAL HIGH (ref <18)

## 2022-08-30 LAB — SARS CORONAVIRUS 2 BY RT PCR: SARS Coronavirus 2 by RT PCR: NEGATIVE

## 2022-08-30 LAB — VITAMIN B12: Vitamin B-12: >1500 pg/mL — ABNORMAL HIGH (ref 211–911)

## 2022-08-30 LAB — BASIC METABOLIC PANEL
BUN: 33 mg/dL — ABNORMAL HIGH (ref 6–23)
CO2: 31 mEq/L (ref 19–32)
Calcium: 9.5 mg/dL (ref 8.4–10.5)
Chloride: 96 mEq/L (ref 96–112)
Creatinine, Ser: 1.3 mg/dL — ABNORMAL HIGH (ref 0.40–1.20)
GFR: 36.89 mL/min — ABNORMAL LOW (ref >60.00)
Glucose, Bld: 94 mg/dL (ref 70–99)
Potassium: 4.5 mEq/L (ref 3.5–5.1)
Sodium: 133 mEq/L — ABNORMAL LOW (ref 135–145)

## 2022-08-30 LAB — BLOOD GAS, VENOUS
Acid-Base Excess: 1.5 mmol/L (ref 0.0–2.0)
Bicarbonate: 28.6 mmol/L — ABNORMAL HIGH (ref 20.0–28.0)
O2 Saturation: 63.3 %
Patient temperature: 37
pCO2, Ven: 53 mmHg (ref 44–60)
pH, Ven: 7.34 (ref 7.25–7.43)
pO2, Ven: 42 mmHg (ref 32–45)

## 2022-08-30 LAB — PROTIME-INR
INR: 1.1 (ref 0.8–1.2)
Prothrombin Time: 14.2 s (ref 11.4–15.2)

## 2022-08-30 LAB — BRAIN NATRIURETIC PEPTIDE: B Natriuretic Peptide: 1397.1 pg/mL — ABNORMAL HIGH (ref 0.0–100.0)

## 2022-08-30 MED ORDER — MORPHINE SULFATE (PF) 2 MG/ML IV SOLN
1.0000 mg | INTRAVENOUS | Status: DC | PRN
  Administered 2022-08-30 – 2022-08-31 (×3): 1 mg via INTRAVENOUS
  Filled 2022-08-30 (×3): qty 1

## 2022-08-30 MED ORDER — FUROSEMIDE 10 MG/ML IJ SOLN
40.0000 mg | Freq: Once | INTRAMUSCULAR | Status: AC
  Administered 2022-08-30: 40 mg via INTRAVENOUS
  Filled 2022-08-30: qty 4

## 2022-08-30 MED ORDER — ACETAMINOPHEN 325 MG PO TABS
650.0000 mg | ORAL_TABLET | Freq: Four times a day (QID) | ORAL | Status: DC | PRN
  Administered 2022-08-31 – 2022-09-02 (×3): 650 mg via ORAL
  Filled 2022-08-30 (×4): qty 2

## 2022-08-30 MED ORDER — IOHEXOL 350 MG/ML SOLN
100.0000 mL | Freq: Once | INTRAVENOUS | Status: AC | PRN
  Administered 2022-08-30: 100 mL via INTRAVENOUS

## 2022-08-30 MED ORDER — ACETAMINOPHEN 650 MG RE SUPP: 650.0000 mg | Freq: Four times a day (QID) | RECTAL | Status: DC | PRN

## 2022-08-30 MED ORDER — TACROLIMUS 1 MG PO CAPS
2.0000 mg | ORAL_CAPSULE | Freq: Every day | ORAL | Status: DC
  Administered 2022-08-31 – 2022-09-04 (×5): 2 mg via ORAL
  Filled 2022-08-30 (×6): qty 2

## 2022-08-30 MED ORDER — SODIUM CHLORIDE 0.9% FLUSH
3.0000 mL | INTRAVENOUS | Status: DC | PRN
  Administered 2022-09-02: 3 mL via INTRAVENOUS

## 2022-08-30 MED ORDER — LEVETIRACETAM 250 MG PO TABS
250.0000 mg | ORAL_TABLET | Freq: Two times a day (BID) | ORAL | Status: DC
  Administered 2022-08-30 – 2022-09-04 (×10): 250 mg via ORAL
  Filled 2022-08-30 (×10): qty 1

## 2022-08-30 MED ORDER — FUROSEMIDE 10 MG/ML IJ SOLN
40.0000 mg | Freq: Two times a day (BID) | INTRAMUSCULAR | Status: DC
  Administered 2022-08-31 – 2022-09-01 (×3): 40 mg via INTRAVENOUS
  Filled 2022-08-30 (×3): qty 4

## 2022-08-30 MED ORDER — SODIUM CHLORIDE 0.9% FLUSH
3.0000 mL | Freq: Two times a day (BID) | INTRAVENOUS | Status: DC
  Administered 2022-08-31 – 2022-09-04 (×8): 3 mL via INTRAVENOUS

## 2022-08-30 MED ORDER — SODIUM CHLORIDE 0.9 % IV SOLN: 250.0000 mL | INTRAVENOUS | Status: DC | PRN

## 2022-08-30 MED ORDER — NITROGLYCERIN IN D5W 200-5 MCG/ML-% IV SOLN
0.0000 ug/min | INTRAVENOUS | Status: DC
  Administered 2022-08-30: 3 ug/min via INTRAVENOUS
  Filled 2022-08-30: qty 250

## 2022-08-30 MED ORDER — SODIUM CHLORIDE 0.9 % IV BOLUS
1000.0000 mL | Freq: Once | INTRAVENOUS | Status: AC
  Administered 2022-08-30: 1000 mL via INTRAVENOUS

## 2022-08-30 MED ORDER — TACROLIMUS 1 MG PO CAPS
3.0000 mg | ORAL_CAPSULE | Freq: Every day | ORAL | Status: DC
  Administered 2022-08-31 – 2022-09-03 (×5): 3 mg via ORAL
  Filled 2022-08-30 (×6): qty 3

## 2022-08-30 NOTE — Assessment & Plan Note (Signed)
Appreciate cardiology input For tonight holding Coreg to avoid hypotension but will resume if able to tolerate

## 2022-08-30 NOTE — Telephone Encounter (Signed)
Caller States: -pt was able to get to lab for repeat. -pt is still weak and fatigued -she thinks patient is "very dehydrated"  -she is taking patient to [facility] ER to get fluids.   No further follow up is requested at this time.

## 2022-08-30 NOTE — ED Provider Triage Note (Signed)
Emergency Medicine Provider Triage Evaluation Note  [name] , a 88 y.o. female  was evaluated in triage.  Pt complains of week of fatigue and off-and-on diarrhea for the past week.  Daughter had brought her to the doctor and had lab work done and they were concerned that she was dehydrated.  Her potassium and calcium levels were elevated, but the doctor thought this might not have been accurate and wanted her to have repeat labs.  She was getting labs drawn today, when her daughter thought they might is will bring her over to get some IV fluids as she thinks that she is dehydrated.  Patient states that she is continuing to have some diarrhea, its not as often as before.  Maybe once a day at max.  Denies any dark or tarry stools.  Not on any blood thinners.  Review of Systems  Positive: Diarrhea, fatigue Negative: Fever, chest pain, abdominal pain  Physical Exam  BP 126/82 (BP Location: Left Arm)   Pulse 84   Temp 97.7 F (36.5 C) (Axillary)   Resp (!) 24   LMP  (LMP Unknown)   SpO2 98%  Gen:   Awake, no distress   Resp:  Normal effort  MSK:   Moves extremities without difficulty  Other:  Patient initially had oxygen saturation of around 80% on room air.  Put on 2 L nasal cannula, elevated to about 90, bumped up to 3 L with sat of 100%  Medical Decision Making  Medically screening exam initiated at 4:07 PM.  Appropriate orders placed.  [name] was informed that the remainder of the evaluation will be completed by another provider, this initial triage assessment does not replace that evaluation, and the importance of remaining in the ED until their evaluation is complete.  Upon chart review, potassium from 9/26 was 6.2.  Calcium level was 0.1 above normal 10.6.   Marlette Curvin T, PA-C 08/30/22 1611

## 2022-08-30 NOTE — Telephone Encounter (Signed)
FYI

## 2022-08-30 NOTE — Subjective & Objective (Signed)
Comes in with diarrhea appears to be dehydrated but also hypoxic.  She is taking Prograf for history of liver transplant secondary to cirrhosis.  Recently was seen by her primary care provider who diagnosed her AKI but also noted her potassium was high.  They felt it was may be a lab error repeated labs today.  While in the office patient noted to be hypoxic down to 88% with subjective shortness of breath.  She has had decreased p.o. intake but no vomiting.  No fevers or chills

## 2022-08-30 NOTE — Assessment & Plan Note (Signed)
this patient has acute respiratory failure with Hypoxia a as documented by the presence of following: O2 saturatio< 90% on RA  Likely due to  CHF exacerbation Provide O2 therapy and titrate as needed  Continuous pulse ox   check Pulse ox with ambulation prior to discharge   may need  TC consult for home O2 set up

## 2022-08-30 NOTE — Assessment & Plan Note (Signed)
Continue Keppra at 250 mg twice a day

## 2022-08-30 NOTE — ED Notes (Signed)
Pt desat down to 86% on room air, placed on 2L nasal cannula

## 2022-08-30 NOTE — ED Notes (Signed)
Bipap removed from pt per pt and family request, pt unable to tolerate bipap. RT and MD notified.

## 2022-08-30 NOTE — Telephone Encounter (Signed)
Hydration actually appears to be improving-did have slightly low white count-wonder if could be developing infection since it sounds like symptoms worsening-we will follow-up on ED notes

## 2022-08-30 NOTE — ED Notes (Signed)
RT and MD at bedside.

## 2022-08-30 NOTE — Assessment & Plan Note (Signed)
Status post liver transplant continue Prograf obtain Prograf level.

## 2022-08-30 NOTE — Assessment & Plan Note (Signed)
Pulmonary edema and increased work of breathing in the setting of significant valvular disease discussed with cardiology night.  Patient may benefit from palliative care discussion. At this point will try to support with IV diuresis repeat echogram BiPAP for increased work of breathing Also we will give a trial for nitro drip if BP allows. Cardiology will see in a.m.

## 2022-08-30 NOTE — ED Notes (Signed)
Needs labs for CT

## 2022-08-30 NOTE — Assessment & Plan Note (Addendum)
Continue Prograf 2 mg daily at twice a day

## 2022-08-30 NOTE — ED Provider Notes (Addendum)
[name] DEPT Provider Note   CSN: [id number] Arrival date & time: 08/30/22  1540     History  Chief Complaint  Patient presents with   Fatigue   Diarrhea    [name] is a 88 y.o. female hx of diastolic heart failure, seizure, liver transplant on Prograf here presenting with diarrhea and possible dehydration and hypoxia.  Patient went to see her doctor 2 days ago for possible gastroenteritis symptoms.  Patient had labs drawn in the office. Patient was noted to be hyperkalemic with a mild AKI.  It was thought to be lab error at that time.  Patient received a call back from the doctor today and was told to get repeat labs.  While in the office, patient's oxygen level is 88 to 89%.  She has some subjective shortness of breath as well.  Patient states that she has not been eating and drinking much but has no vomiting.  The history is provided by the patient.       Home Medications Prior to Admission medications   Medication Sig Start Date End Date Taking? Authorizing Provider  alendronate (FOSAMAX) 70 MG tablet TAKE 1 TABLET BY MOUTH  WEEKLY WITH 8 OZ OF PLAIN  WATER 30 MINUTES BEFORE  FIRST FOOD, DRINK OR MEDS.  STAY UPRIGHT FOR 30 MINS 05/31/22   Marin Olp, MD  Biotin 5 MG CAPS Take 5 mg by mouth daily. 05/26/19   [provider]  Calcium Carbonate-Vit D-Min (CALCIUM 1200 PO) Take 1 tablet by mouth daily.    [provider]  carvedilol (COREG) 3.125 MG tablet TAKE ONE TABLET BY MOUTH TWICE A DAY 03/07/22   Josue Hector, MD  cholecalciferol (VITAMIN D) 25 MCG (1000 UNIT) tablet Take 1,000 Units by mouth daily. 07/27/20   [provider]  Cyanocobalamin (B-12 PO) Take by mouth once a week.     [provider]  levETIRAcetam (KEPPRA) 250 MG tablet TAKE 1 TABLET BY MOUTH  TWICE DAILY 10/18/21   Marin Olp, MD  MAGNESIUM PO Take by mouth.    [provider]  Multiple Vitamin (MULTIVITAMIN)  tablet Take 1 tablet by mouth daily.    [provider]  tacrolimus (PROGRAF) 1 MG capsule Take 2 mg by mouth 2 (two) times daily. 2 MG AM AND 3 MG PM    [provider]  zolpidem (AMBIEN CR) 6.25 MG CR tablet TAKE ONE TABLET BY MOUTH EVERY NIGHT AT BEDTIME AS NEEDED FOR SLEEP 07/16/22   Marin Olp, MD      Allergies    Nsaids and Codeine    Review of Systems   Review of Systems  Respiratory:  Positive for shortness of breath.   Gastrointestinal:  Positive for diarrhea.  All other systems reviewed and are negative.   Physical Exam Updated Vital Signs BP 128/61   Pulse 73   Temp 97.7 F (36.5 C) (Axillary)   Resp 17   LMP  (LMP Unknown)   SpO2 100%  Physical Exam Vitals and nursing note reviewed.  Constitutional:      Comments: Chronic ill, tachypneic  HENT:     Head: Normocephalic.     Nose: Nose normal.     Mouth/Throat:     Mouth: Mucous membranes are moist.  Eyes:     Extraocular Movements: Extraocular movements intact.     Pupils: Pupils are equal, round, and reactive to light.  Cardiovascular:     Rate and  Rhythm: Normal rate and regular rhythm.     Pulses: Normal pulses.  Pulmonary:     Comments: Crackles bilateral bases Abdominal:     General: Abdomen is flat.     Palpations: Abdomen is soft.     Comments: Mild epigastric tenderness  Musculoskeletal:        General: Normal range of motion.     Cervical back: Normal range of motion and neck supple.  Skin:    General: Skin is warm.     Capillary Refill: Capillary refill takes less than 2 seconds.  Neurological:     General: No focal deficit present.     Mental Status: She is alert and oriented to person, place, and time.  Psychiatric:        Mood and Affect: Mood normal.        Behavior: Behavior normal.     ED Results / Procedures / Treatments   Labs (all labs ordered are listed, but only abnormal results are displayed) Labs Reviewed  CBC - Abnormal; Notable for the  following components:      Result Value   WBC 3.9 (*)    MCV 107.0 (*)    All other components within normal limits  COMPREHENSIVE METABOLIC PANEL - Abnormal; Notable for the following components:   Sodium 134 (*)    BUN 33 (*)    Creatinine, Ser 1.24 (*)    Albumin 3.1 (*)    GFR, Estimated 42 (*)    All other components within normal limits  BRAIN NATRIURETIC PEPTIDE - Abnormal; Notable for the following components:   B Natriuretic Peptide 1,397.1 (*)    All other components within normal limits  TROPONIN I (HIGH SENSITIVITY) - Abnormal; Notable for the following components:   Troponin I (High Sensitivity) 34 (*)    All other components within normal limits  SARS CORONAVIRUS 2 BY RT PCR  URINALYSIS, ROUTINE W REFLEX MICROSCOPIC  TROPONIN I (HIGH SENSITIVITY)    EKG EKG Interpretation  Date/Time:  Thursday August 30 2022 16:05:50 EDT Ventricular Rate:  71 PR Interval:  163 QRS Duration: 123 QT Interval:  448 QTC Calculation: 487 R Axis:   150 Text Interpretation: Sinus rhythm Left atrial enlargement Right bundle branch block Borderline ST elevation, lateral leads No significant change since last tracing Confirmed by Wandra Arthurs [phone]) on 08/30/2022 4:29:16 PM  Radiology CT ABDOMEN PELVIS W CONTRAST  Result Date: 08/30/2022 CLINICAL DATA:  Abdominal pain, acute, nonlocalized EXAM: CT ABDOMEN AND PELVIS WITH CONTRAST TECHNIQUE: Multidetector CT imaging of the abdomen and pelvis was performed using the standard protocol following bolus administration of intravenous contrast. RADIATION DOSE REDUCTION: This exam was performed according to the departmental dose-optimization program which includes automated exposure control, adjustment of the mA and/or kV according to patient size and/or use of iterative reconstruction technique. CONTRAST:  127m OMNIPAQUE IOHEXOL 350 MG/ML SOLN COMPARISON:  11/13/2004 FINDINGS: Lower chest: See chest CT report Hepatobiliary: Heterogeneous  enhancement throughout the liver without focal lesion. Prior cholecystectomy. Common bile duct is dilated measuring 14 mm, likely related to age and post cholecystectomy state. Pancreas: Atrophy.  No focal abnormality or ductal dilatation. Spleen: 11 mm low-density lesion in superior aspect of the spleen, likely small cyst or hemangioma. Normal size. Adrenals/Urinary Tract: No adrenal abnormality. No focal renal abnormality. No stones or hydronephrosis. Urinary bladder is unremarkable. Stomach/Bowel: Stomach, large and small bowel grossly unremarkable. Vascular/Lymphatic: Heavily calcified aorta and iliac vessels. No evidence of aneurysm or adenopathy. Reproductive: No visible  focal abnormality. Other: No free fluid or free air. Musculoskeletal: No acute bony abnormality. Old healed left inferior pubic ramus fracture. IMPRESSION: No acute findings in the abdomen or pelvis. Aortoiliac atherosclerosis. Electronically Signed   By: Rolm Baptise M.D.   On: 08/30/2022 17:25   CT Angio Chest PE W and/or Wo Contrast  Result Date: 08/30/2022 CLINICAL DATA:  Pulmonary embolism (PE) suspected, high prob EXAM: CT ANGIOGRAPHY CHEST WITH CONTRAST TECHNIQUE: Multidetector CT imaging of the chest was performed using the standard protocol during bolus administration of intravenous contrast. Multiplanar CT image reconstructions and MIPs were obtained to evaluate the vascular anatomy. RADIATION DOSE REDUCTION: This exam was performed according to the departmental dose-optimization program which includes automated exposure control, adjustment of the mA and/or kV according to patient size and/or use of iterative reconstruction technique. CONTRAST:  130m OMNIPAQUE IOHEXOL 350 MG/ML SOLN COMPARISON:  06/13/2006 FINDINGS: Cardiovascular: Cardiomegaly. Densely calcified mitral valve annulus, as well as coronary arteries and scattered aortic calcifications. No aneurysm. No filling defects in the pulmonary arteries to suggest pulmonary  emboli. Mediastinum/Nodes: No mediastinal, hilar, or axillary adenopathy. Trachea and esophagus are unremarkable. Thyroid unremarkable. Lungs/Pleura: Small to moderate bilateral effusions, right greater than left. Bilateral ground-glass airspace opacities, likely edema/CHF. Upper Abdomen: No acute findings Musculoskeletal: Chest wall soft tissues are unremarkable. No acute bony abnormality. Review of the MIP images confirms the above findings. IMPRESSION: No evidence of pulmonary embolus. Cardiomegaly with bilateral airspace disease and effusions, likely CHF. Coronary artery disease. Aortic Atherosclerosis (ICD10-I70.0). Electronically Signed   By: KRolm BaptiseM.D.   On: 08/30/2022 17:22   DG Chest 2 View  Result Date: 08/30/2022 CLINICAL DATA:  Fatigue, low oxygen EXAM: CHEST - 2 VIEW COMPARISON:  04/04/2018 FINDINGS: Cardiomegaly. Bilateral airspace opacities concerning for edema. No effusions or pneumothorax. No acute bony abnormality. Aortic atherosclerosis. Dense mitral valve annular calcifications. IMPRESSION: Cardiomegaly with bilateral airspace disease, likely edema/CHF. Electronically Signed   By: KRolm BaptiseM.D.   On: 08/30/2022 17:16    Procedures Procedures    CRITICAL CARE Performed by: DWandra Arthurs  Total critical care time: 40 minutes  Critical care time was exclusive of separately billable procedures and treating other patients.  Critical care was necessary to treat or prevent imminent or life-threatening deterioration.  Critical care was time spent personally by me on the following activities: development of treatment plan with patient and/or surrogate as well as nursing, discussions with consultants, evaluation of patient's response to treatment, examination of patient, obtaining history from patient or surrogate, ordering and performing treatments and interventions, ordering and review of laboratory studies, ordering and review of radiographic studies, pulse oximetry and  re-evaluation of patient's condition.   Medications Ordered in ED Medications  sodium chloride 0.9 % bolus 1,000 mL (0 mLs Intravenous Stopped 08/30/22 1801)  iohexol (OMNIPAQUE) 350 MG/ML injection 100 mL (100 mLs Intravenous Contrast Given 08/30/22 1703)  furosemide (LASIX) injection 40 mg (40 mg Intravenous Given 08/30/22 1810)    ED Course/ Medical Decision Making/ A&P                           Medical Decision Making MWYNELL HALBERGis a 88y.o. female here presenting with shortness of breath and diarrhea.  Patient is hypoxic and oxygen dropped to 86% on room air.  Patient has been having diarrhea and appears slightly dehydrated.  Patient also had possible abnormal labs outpatient but I think likely the hyperkalemia is  from hemolysis.  We will recheck a CMP and CBC and COVID test.  We will get CT chest and CT abdomen pelvis.  6:44 PM I reviewed patient's labs and independently interpreted imaging studies.  Patient's creatinine came down to 1.2.  Her potassium is normal.  Patient's CTA chest showed CHF.  Patient received a 500 cc fluid and I stopped the IV fluids and gave her 40 mg of Lasix IV.  Her BNP is up to 1300.  Since she has a new oxygen requirement, patient will be admitted for CHF exacerbation.  8:37 PM Patient now has worsening work of breathing and is now on BiPAP.  I discussed case with on-call cardiology, Dr. Hassell Done.  She reviewed the previous echo.  Patient has severe mitral regurg as well as tricuspid and aortic regurg.  She is not a surgical candidate.  She states that she should just get diuresed as much as she can.  She thinks that palliative care may be the best option right now.  Cardiology will see the patient in the morning.   Problems Addressed: Acute on chronic congestive heart failure, unspecified heart failure type [facility]): acute illness or injury Acute respiratory failure with hypoxia [facility]): acute illness or injury  Amount and/or Complexity of Data  Reviewed Labs: ordered. Decision-making details documented in ED Course. Radiology: ordered and independent interpretation performed. Decision-making details documented in ED Course. ECG/medicine tests: ordered and independent interpretation performed. Decision-making details documented in ED Course.  Risk Prescription drug management. Decision regarding hospitalization.    Final Clinical Impression(s) / ED Diagnoses Final diagnoses:  None    Rx / DC Orders ED Discharge Orders     None         Drenda Freeze, MD 08/30/22 1846    Drenda Freeze, MD 08/30/22 2041

## 2022-08-30 NOTE — ED Triage Notes (Signed)
Pt arrives c/o fatigue and diarrhea for one week. Pt was evaluated at PCP and told to follow up on abnormal labs (K - 6.2, Ca - 10.6)

## 2022-08-30 NOTE — H&P (Addendum)
[name]:[id number] DOB: 09-Nov-1932 DOA: 08/30/2022     PCP: Marin Olp, MD   Outpatient Specialists:   CARDS:   Dr. Filbert Schilder at [facility]    Patient arrived to ER on 08/30/22 at 1540 Referred by Attending Drenda Freeze, MD   Patient coming from:    home Lives alone     Chief Complaint:   Chief Complaint  Patient presents with   Fatigue   Diarrhea    HPI: [name] is a 88 y.o. female with medical history significant of    diastolic CHF, seizure disorder on Keppra, history of cirrhosis status post liver transplant on Prograf, aortic insufficiency, having cardiopulmonary syndrome CKD stage 3.  Presented with   fatigue Comes in with diarrhea appears to be dehydrated but also hypoxic.  She is taking Prograf for history of liver transplant secondary to cirrhosis.  Recently was seen by her primary care provider who diagnosed her AKI but also noted her potassium was high.  They felt it was may be a lab error repeated labs today.  While in the office patient noted to be hypoxic down to 88% with subjective shortness of breath.  She has had decreased p.o. intake but no vomiting.  No fevers or chills Initial calcium 10.6 and potassium at 6.2 On arrival to emergency department hypoxic down to 86% and started on 2 L Diarrhea has been ongoing for last week but currently improving.  The main complaint now is really shortness of breath and fatigue    Lab Results  Component Value Date   [place] NEGATIVE 08/30/2022     Regarding pertinent Chronic problems:       HTN on Coreg 3.125 bid   chronic CHF diastolic - last echo May 5621 Aortic insufficiency  Grade II diastolic dysfunction There is severely elevated pulmonary artery systolic  pressure. The estimated right ventricular systolic pressure is 30.8 mmHg.  mitral insufficiency with markedly increased forward flow Severe mitral valve regurgitation.  Tricuspid valve regurgitation is  moderate to severe.    History of seizure disorder on Keppra       CKD stage IIIa- baseline Cr 1.0 Estimated Creatinine Clearance: 26.1 mL/min (A) (by C-G formula based on SCr of 1.24 mg/dL (H)).  Lab Results  Component Value Date   CREATININE 1.24 (H) 08/30/2022   CREATININE 1.30 (H) 08/30/2022   CREATININE 1.57 (H) 08/28/2022     Liver disease status post liver transplant    While in ER:   Creatinine now down to 1.24 with potassium down to 4.7   CXR - CHF  CTabd/pelvis -  non-acute  CTA chest -    no PE, CHF  Following Medications were ordered in ER: Medications  sodium chloride 0.9 % bolus 1,000 mL (0 mLs Intravenous Stopped 08/30/22 1801)  iohexol (OMNIPAQUE) 350 MG/ML injection 100 mL (100 mLs Intravenous Contrast Given 08/30/22 1703)  furosemide (LASIX) injection 40 mg (40 mg Intravenous Given 08/30/22 1810)    _______________________________________________________ ER Provider Called:  cardiology     Dr.Falk They Recommend admit to medicine   Will see in AM      ED Triage Vitals  Enc Vitals Group     BP 08/30/22 1555 126/82     Pulse Rate 08/30/22 1555 84     Resp 08/30/22 1555 (!) 24     Temp 08/30/22 1555 97.7 F (36.5 C)     Temp Source 08/30/22 1555 Axillary  SpO2 08/30/22 1555 98 %     Weight --      Height --      Head Circumference --      Peak Flow --      Pain Score 08/30/22 1609 0     Pain Loc --      Pain Edu? --      Excl. in [place]? --   TMAX(24)@     _________________________________________ Significant initial  Findings: Abnormal Labs Reviewed  CBC - Abnormal; Notable for the following components:      Result Value   WBC 3.9 (*)    MCV 107.0 (*)    All other components within normal limits  COMPREHENSIVE METABOLIC PANEL - Abnormal; Notable for the following components:   Sodium 134 (*)    BUN 33 (*)    Creatinine, Ser 1.24 (*)    Albumin 3.1 (*)    GFR, Estimated 42 (*)    All other components within normal limits  BRAIN  NATRIURETIC PEPTIDE - Abnormal; Notable for the following components:   B Natriuretic Peptide 1,397.1 (*)    All other components within normal limits  TROPONIN I (HIGH SENSITIVITY) - Abnormal; Notable for the following components:   Troponin I (High Sensitivity) 34 (*)    All other components within normal limits  TROPONIN I (HIGH SENSITIVITY) - Abnormal; Notable for the following components:   Troponin I (High Sensitivity) 31 (*)    All other components within normal limits    _________________________ Troponin 34 ECG: Ordered Personally reviewed and interpreted by me showing: HR : 71 Rhythm:   Sinus rhythm Left atrial enlargement Right bundle branch block Borderline ST elevation, lateral leads No significant change since last tracing QTC 487    The recent clinical data is shown below. Vitals:   08/30/22 1609 08/30/22 1645 08/30/22 1745 08/30/22 1800  BP:  126/61 129/64 128/61  Pulse: 70 71 76 73  Resp: (!) 23 17    Temp:      TempSrc:      SpO2: 96%  100% 100%       WBC     Component Value Date/Time   WBC 3.9 (L) 08/30/2022 1643   LYMPHSABS 1.0 08/28/2022 1614   MONOABS 0.4 08/28/2022 1614   EOSABS 0.2 08/28/2022 1614   BASOSABS 0.1 08/28/2022 1614      Results for orders placed or performed during the hospital encounter of 08/30/22  SARS Coronavirus 2 by RT PCR (hospital order, performed in [facility] hospital lab) *cepheid single result test* Anterior Nasal Swab     Status: None   Collection Time: 08/30/22  4:49 PM   Specimen: Anterior Nasal Swab  Result Value Ref Range Status   SARS Coronavirus 2 by RT PCR NEGATIVE NEGATIVE Final           _______________________________________________ Hospitalist was called for admission for   Acute on chronic congestive heart failure, unspecified heart failure type   Acute respiratory failure with hypoxia     The following Work up has been ordered so far:  Orders Placed This Encounter  Procedures   SARS Coronavirus  2 by RT PCR (hospital order, performed in [place] hospital lab) *cepheid single result test* Anterior Nasal Swab   DG Chest 2 View   CT Angio Chest PE W and/or Wo Contrast   CT ABDOMEN PELVIS W CONTRAST   CBC   Urinalysis, Routine w reflex microscopic   Comprehensive metabolic panel   Brain natriuretic  peptide   Document Height and Actual Weight   Consult to hospitalist   ED EKG   EKG 12-Lead     OTHER Significant initial  Findings:  labs showing:    Recent Labs  Lab 08/28/22 1614 08/30/22 1524 08/30/22 1643  NA 139 133* 134*  K 6.2* 4.5 4.7  CO2 34* 31 30  GLUCOSE 115* 94 92  BUN 46* 33* 33*  CREATININE 1.57* 1.30* 1.24*  CALCIUM 10.6* 9.5 9.0    Cr    Up from baseline see below Lab Results  Component Value Date   CREATININE 1.24 (H) 08/30/2022   CREATININE 1.30 (H) 08/30/2022   CREATININE 1.57 (H) 08/28/2022    Recent Labs  Lab 08/28/22 1614 08/30/22 1643  AST 19 19  ALT 12 13  ALKPHOS 72 66  BILITOT 0.9 1.2  PROT 7.4 6.5  ALBUMIN 3.6 3.1*   Lab Results  Component Value Date   CALCIUM 9.0 08/30/2022        Plt: Lab Results  Component Value Date   PLT 202 08/30/2022       COVID-19 Labs  No results for input(s): "DDIMER", "FERRITIN", "LDH", "CRP" in the last 72 hours.  Lab Results  Component Value Date   SARSCOV2NAA NEGATIVE 08/30/2022        Recent Labs  Lab 08/28/22 1614 08/30/22 1524 08/30/22 1643  WBC 5.2 3.9* 3.9*  NEUTROABS 3.6  --   --   HGB 13.9 14.2 13.7  HCT 43.9 43.6 44.5  MCV 105.3* 102.1* 107.0*  PLT 220.0 224.0 202    HG/HCT  stable,      Component Value Date/Time   HGB 13.7 08/30/2022 1643   HCT 44.5 08/30/2022 1643   MCV 107.0 (H) 08/30/2022 1643      BNP (last 3 results) Recent Labs    08/30/22 1643  BNP 1,397.1*    Cultures:    Component Value Date/Time   SDES URINE, CATHETERIZED 11/08/2007 1105   SPECREQUEST IMMUNE:NORM UT SYMPT:NEG 11/08/2007 1105   CULT  11/08/2007 1105    KLEBSIELLA  PNEUMONIAE Note: Confirmed Extended Spectrum Beta-Lactamase Producer (ESBL) CRITICAL RESULT CALLED TO, READ BACK BY AND VERIFIED WITH: [facility] CHANDLER RN AT 2316 ON 11/10/07 BY CASTILLO C ESCHERICHIA COLI   REPTSTATUS 11/11/2007 FINAL 11/08/2007 1105     Radiological Exams on Admission: CT ABDOMEN PELVIS W CONTRAST  Result Date: 08/30/2022 CLINICAL DATA:  Abdominal pain, acute, nonlocalized EXAM: CT ABDOMEN AND PELVIS WITH CONTRAST TECHNIQUE: Multidetector CT imaging of the abdomen and pelvis was performed using the standard protocol following bolus administration of intravenous contrast. RADIATION DOSE REDUCTION: This exam was performed according to the departmental dose-optimization program which includes automated exposure control, adjustment of the mA and/or kV according to patient size and/or use of iterative reconstruction technique. CONTRAST:  171m OMNIPAQUE IOHEXOL 350 MG/ML SOLN COMPARISON:  11/13/2004 FINDINGS: Lower chest: See chest CT report Hepatobiliary: Heterogeneous enhancement throughout the liver without focal lesion. Prior cholecystectomy. Common bile duct is dilated measuring 14 mm, likely related to age and post cholecystectomy state. Pancreas: Atrophy.  No focal abnormality or ductal dilatation. Spleen: 11 mm low-density lesion in superior aspect of the spleen, likely small cyst or hemangioma. Normal size. Adrenals/Urinary Tract: No adrenal abnormality. No focal renal abnormality. No stones or hydronephrosis. Urinary bladder is unremarkable. Stomach/Bowel: Stomach, large and small bowel grossly unremarkable. Vascular/Lymphatic: Heavily calcified aorta and iliac vessels. No evidence of aneurysm or adenopathy. Reproductive: No visible focal abnormality. Other: No free fluid or  free air. Musculoskeletal: No acute bony abnormality. Old healed left inferior pubic ramus fracture. IMPRESSION: No acute findings in the abdomen or pelvis. Aortoiliac atherosclerosis. Electronically Signed   By:  Rolm Baptise M.D.   On: 08/30/2022 17:25   CT Angio Chest PE W and/or Wo Contrast  Result Date: 08/30/2022 CLINICAL DATA:  Pulmonary embolism (PE) suspected, high prob EXAM: CT ANGIOGRAPHY CHEST WITH CONTRAST TECHNIQUE: Multidetector CT imaging of the chest was performed using the standard protocol during bolus administration of intravenous contrast. Multiplanar CT image reconstructions and MIPs were obtained to evaluate the vascular anatomy. RADIATION DOSE REDUCTION: This exam was performed according to the departmental dose-optimization program which includes automated exposure control, adjustment of the mA and/or kV according to patient size and/or use of iterative reconstruction technique. CONTRAST:  152m OMNIPAQUE IOHEXOL 350 MG/ML SOLN COMPARISON:  06/13/2006 FINDINGS: Cardiovascular: Cardiomegaly. Densely calcified mitral valve annulus, as well as coronary arteries and scattered aortic calcifications. No aneurysm. No filling defects in the pulmonary arteries to suggest pulmonary emboli. Mediastinum/Nodes: No mediastinal, hilar, or axillary adenopathy. Trachea and esophagus are unremarkable. Thyroid unremarkable. Lungs/Pleura: Small to moderate bilateral effusions, right greater than left. Bilateral ground-glass airspace opacities, likely edema/CHF. Upper Abdomen: No acute findings Musculoskeletal: Chest wall soft tissues are unremarkable. No acute bony abnormality. Review of the MIP images confirms the above findings. IMPRESSION: No evidence of pulmonary embolus. Cardiomegaly with bilateral airspace disease and effusions, likely CHF. Coronary artery disease. Aortic Atherosclerosis (ICD10-I70.0). Electronically Signed   By: KRolm BaptiseM.D.   On: 08/30/2022 17:22   DG Chest 2 View  Result Date: 08/30/2022 CLINICAL DATA:  Fatigue, low oxygen EXAM: CHEST - 2 VIEW COMPARISON:  04/04/2018 FINDINGS: Cardiomegaly. Bilateral airspace opacities concerning for edema. No effusions or pneumothorax. No acute  bony abnormality. Aortic atherosclerosis. Dense mitral valve annular calcifications. IMPRESSION: Cardiomegaly with bilateral airspace disease, likely edema/CHF. Electronically Signed   By: KRolm BaptiseM.D.   On: 08/30/2022 17:16   _______________________________________________________________________________________________________ Latest  Blood pressure 128/61, pulse 73, temperature 97.7 F (36.5 C), temperature source Axillary, resp. rate 17, SpO2 100 %.   Vitals  labs and radiology finding personally reviewed  Review of Systems:    Pertinent positives include:  hortness of breath at rest. diarrhea,   Constitutional:  No weight loss, night sweats, Fevers, chills, fatigue, weight loss  HEENT:  No headaches, Difficulty swallowing,Tooth/dental problems,Sore throat,  No sneezing, itching, ear ache, nasal congestion, post nasal drip,  Cardio-vascular:  No chest pain, Orthopnea, PND, anasarca, dizziness, palpitations.no Bilateral lower extremity swelling  GI:  No heartburn, indigestion, abdominal pain, nausea, vomiting, change in bowel habits, loss of appetite, melena, blood in stool, hematemesis Resp:  no sNo dyspnea on exertion, No excess mucus, no productive cough, No non-productive cough, No coughing up of blood.No change in color of mucus.No wheezing. Skin:  no rash or lesions. No jaundice GU:  no dysuria, change in color of urine, no urgency or frequency. No straining to urinate.  No flank pain.  Musculoskeletal:  No joint pain or no joint swelling. No decreased range of motion. No back pain.  Psych:  No change in mood or affect. No depression or anxiety. No memory loss.  Neuro: no localizing neurological complaints, no tingling, no weakness, no double vision, no gait abnormality, no slurred speech, no confusion  All systems reviewed and apart from [place] are negative _______________________________________________________________________________________________ Past Medical  History:   Past Medical History:  Diagnosis Date   [place]osis [place]  s/p Liver Transplant - follow at [facility]   History of cardiac catheterization    a. LHC 06/2006: EF 65-70%, pLAD 20%   History of GI bleed    patient denies   Mitral valve insufficiency and aortic valve insufficiency    a. Echo 3/14:  Mild LVH, EF 55-65%, Gr 1 DD, moderate AI, MAC, mild mitral stenosis, mild to mod MR, mod LAE, PASP 34 mmHg //  b. Echo 6/15: Mild LVH, EF 55-60%, Gr 2 DD, mode AI, MAC, trivial mitral stenosis (mean gradient 5 mmHg), mod MR, mod LAE, PASP 30 mmHg  //  c. Echo 6/16: EF 55-60%, no RWMA, Gr 2 DD, mod AI, mildly dilated Asc aorta (38 mm), MAC, mod MS, moderate MR, PASP 31 mmHg    Osteoporosis    Seizure disorder ([place])    followed by Neuro      Past Surgical History:  Procedure Laterality Date   LIVER TRANSPLANT  08/08    Social History:  Ambulatory   independently        reports that she has quit smoking. She has never used smokeless tobacco. She reports that she does not drink alcohol and does not use drugs.   Family History:   Family History  Problem Relation Age of Onset   Heart attack Mother 82   ______________________________________________________________________________________________ Allergies: Allergies  Allergen Reactions   Nsaids Other (See Comments)    Kidney transplant precautions    Codeine Rash     Prior to Admission medications   Medication Sig Start Date End Date Taking? Authorizing Provider  alendronate (FOSAMAX) 70 MG tablet TAKE 1 TABLET BY MOUTH  WEEKLY WITH 8 OZ OF PLAIN  WATER 30 MINUTES BEFORE  FIRST FOOD, DRINK OR MEDS.  STAY UPRIGHT FOR 30 MINS 05/31/22   Marin Olp, MD  Biotin 5 MG CAPS Take 5 mg by mouth daily. 05/26/19   [provider]  Calcium Carbonate-Vit D-Min (CALCIUM 1200 PO) Take 1 tablet by mouth daily.    [provider]  carvedilol (COREG) 3.125 MG tablet TAKE ONE TABLET BY MOUTH TWICE A DAY 03/07/22   Josue Hector, MD  cholecalciferol (VITAMIN D) 25 MCG (1000 UNIT) tablet Take 1,000 Units by mouth daily. 07/27/20   [provider]  Cyanocobalamin (B-12 PO) Take by mouth once a week.     [provider]  levETIRAcetam (KEPPRA) 250 MG tablet TAKE 1 TABLET BY MOUTH  TWICE DAILY 10/18/21   Marin Olp, MD  MAGNESIUM PO Take by mouth.    [provider]  Multiple Vitamin (MULTIVITAMIN) tablet Take 1 tablet by mouth daily.    [provider]  tacrolimus (PROGRAF) 1 MG capsule Take 2 mg by mouth 2 (two) times daily. 2 MG AM AND 3 MG PM    [provider]  zolpidem (AMBIEN CR) 6.25 MG CR tablet TAKE ONE TABLET BY MOUTH EVERY NIGHT AT BEDTIME AS NEEDED FOR SLEEP 07/16/22   Marin Olp, MD    ___________________________________________________________________________________________________ Physical Exam:    08/30/2022    6:00 PM 08/30/2022    5:45 PM 08/30/2022    4:45 PM  Vitals with BMI  Systolic 242 683 419  Diastolic 61 64 61  Pulse 73 76 71     1. General:  in No  Acute distress   Chronically ill   -appearing 2. Psychological: Alert and  Oriented 3. Head/ENT:   Dry Mucous Membranes  Head Non traumatic, neck supple                           Poor Dentition 4. SKIN: n decreased Skin turgor,  Skin clean Dry and intact no rash 5. Heart: Regular rate and rhythm loud Murmur, no Rub or gallop 6. Lungs: , no wheezes some  crackles   7. Abdomen: Soft,  non-tender, Non distended   bowel sounds present 8. Lower extremities: no clubbing, cyanosis, trace edema 9. Neurologically Grossly intact, moving all 4 extremities equally   10. MSK: Normal range of motion    Chart has been reviewed  ______________________________________________________________________________________________  Assessment/Plan  88 y.o. female with medical history significant of    diastolic CHF, seizure disorder on Keppra, history of cirrhosis  status post liver transplant on Prograf, aortic insufficiency, having cardiopulmonary syndrome CKD stage 3.   Admitted for   Acute on chronic congestive heart failure,   Acute respiratory failure with hypoxia      Present on Admission:  Acute on chronic diastolic CHF (congestive heart failure) (HCC)  Hepatic cirrhosis (HCC)  Mitral valve insufficiency and aortic valve insufficiency  CKD (chronic kidney disease), stage III (HCC)  Elevated troponin  Acute respiratory failure with hypoxia (HCC)  UTI (urinary tract infection)     Acute on chronic diastolic CHF (congestive heart failure) (HCC) Pulmonary edema and increased work of breathing in the setting of significant valvular disease discussed with cardiology night.  Patient may benefit from palliative care discussion. At this point will try to support with IV diuresis repeat echogram BiPAP for increased work of breathing Also we will give a trial for nitro drip if BP allows. Cardiology will see in a.m.  Hepatic cirrhosis (HCC) Status post liver transplant continue Prograf obtain Prograf level.  Liver replaced by transplant ([place]) Continue Prograf 2 mg daily at twice a day  Seizure disorder ([place]) Continue Keppra at 250 mg twice a day  Mitral valve insufficiency and aortic valve insufficiency Appreciate cardiology input For tonight holding Coreg to avoid hypotension but will resume if able to tolerate  CKD (chronic kidney disease), stage III (HCC) Monitor renal function  -chronic avoid nephrotoxic medications such as NSAIDs, Vanco Zosyn combo,  avoid hypotension, continue to follow renal function   Elevated troponin Elevated but stable  repeat echo likely in the setting of hypoxia and chf  Acute respiratory failure with hypoxia ([place])  this patient has acute respiratory failure with Hypoxia a as documented by the presence of following: O2 saturatio< 90% on RA  Likely due to  CHF exacerbation Provide O2 therapy and  titrate as needed  Continuous pulse ox   check Pulse ox with ambulation prior to discharge   may need  TC consult for home O2 set up    UTI (urinary tract infection)  - treat with Rocephin        await results of urine culture and adjust antibiotic coverage as needed     Other plan as per orders.  DVT prophylaxis:  SCD     Code Status:    Code Status: Not on file FULL CODE  as per patient   I had personally discussed CODE STATUS with patient     Family Communication:   Family   at  Bedside  plan of care was discussed   with   Daughter   Disposition Plan:         To home once workup is complete  and patient is stable   Following barriers for discharge:                            Electrolytes corrected                                                        Will likely need home health, home O2, set up                           Will need consultants to evaluate patient prior to discharge                       Would benefit from PT/OT eval prior to DC  Ordered                                     Transition of care consulted                   Nutrition    consulted                                      Palliative care    consulted                              Consults called: cardiology are aware will see in consult in AM   Admission status:  ED Disposition     ED Disposition  [place]: [place] [[id number]]  Level of Care: Progressive [102]  Admit to Progressive based on following criteria: CARDIOVASCULAR & THORACIC of moderate stability with acute coronary syndrome symptoms/low risk myocardial infarction/hypertensive urgency/arrhythmias/heart failure potentially compromising stability and stable post cardiovascular intervention patients.  Admit to Progressive based on following criteria: RESPIRATORY PROBLEMS hypoxemic/hypercapnic respiratory failure that is responsive to NIPPV (BiPAP) or High Flow Nasal Cannula  (6-80 lpm). Frequent assessment/intervention, no > Q2 hrs < Q4 hrs, to maintain oxygenation and pulmonary hygiene.  May admit patient to [facility] or [facility] if equivalent level of care is available:: No  Covid Evaluation: Asymptomatic - no recent exposure (last 10 days) testing not required  Diagnosis: Acute on chronic diastolic CHF (congestive heart failure) [facility]) [[id number]]  Admitting Physician: Toy Baker [[id number]]  Attending Physician: Toy Baker [[id number]]  Certification:: I certify this patient will need inpatient services for at least 2 midnights  Estimated Length of Stay: 2          inpatient     I Expect 2 midnight stay secondary to severity of patient's current illness need for inpatient interventions justified by the following:  hemodynamic instability despite optimal treatment (tachycardia  hypoxia,  )   Severe lab/radiological/exam abnormalities including:   [place] CHF exacerbation and extensive comorbidities including:  CHF     That are currently affecting medical management.   I expect  patient to be hospitalized for 2 midnights requiring inpatient medical care.  Patient is at high risk for  adverse outcome (such as loss of life or disability) if not treated.  Indication for inpatient stay as follows:    Hemodynamic instability despite maximal medical therapy,    New or worsening hypoxia   Need for IV diuretics, need for biPAP    Level of care        progressive tele indefinitely please discontinue once patient no longer qualifies COVID-19 Labs    Lab Results  Component Value Date   [place] 08/30/2022     Precautions: admitted as   Covid Negative        Harriet Bollen 08/30/2022, 9:24 PM     [facility]     after 2 AM please page floor coverage PA If 7AM-7PM, please contact the day team taking care of the patient using [email or web address]   Patient was evaluated in the context of the global COVID-19 pandemic,  which necessitated consideration that the patient might be at risk for infection with the SARS-CoV-2 virus that causes COVID-19. Institutional protocols and algorithms that pertain to the evaluation of patients at risk for COVID-19 are in a state of rapid change based on information released by regulatory bodies including the CDC and federal and state organizations. These policies and algorithms were followed during the patient's care.

## 2022-08-30 NOTE — ED Notes (Signed)
In to see pt and pt's family x2, pt c/o increasing shortness of breath. Pt RR=24, attempted to sit pt up, pt declined.  Pt's 02 sat =96% on 2.5LNC. 02 increased to 3LNC.  MD notified.

## 2022-08-30 NOTE — Assessment & Plan Note (Addendum)
Monitor renal function  -chronic avoid nephrotoxic medications such as NSAIDs, Vanco Zosyn combo,  avoid hypotension, continue to follow renal function

## 2022-08-30 NOTE — Assessment & Plan Note (Signed)
Elevated but stable  repeat echo likely in the setting of hypoxia and chf

## 2022-08-30 NOTE — ED Notes (Signed)
Hospitalist at bedside 

## 2022-08-31 ENCOUNTER — Other Ambulatory Visit: Payer: Self-pay | Admitting: Cardiovascular Disease

## 2022-08-31 ENCOUNTER — Other Ambulatory Visit: Payer: Self-pay

## 2022-08-31 ENCOUNTER — Encounter (HOSPITAL_COMMUNITY): Payer: Self-pay | Admitting: Internal Medicine

## 2022-08-31 ENCOUNTER — Inpatient Hospital Stay (HOSPITAL_COMMUNITY): Payer: Medicare Other

## 2022-08-31 DIAGNOSIS — N1832 Chronic kidney disease, stage 3b: Secondary | ICD-10-CM | POA: Diagnosis not present

## 2022-08-31 DIAGNOSIS — R778 Other specified abnormalities of plasma proteins: Secondary | ICD-10-CM | POA: Diagnosis not present

## 2022-08-31 DIAGNOSIS — J9601 Acute respiratory failure with hypoxia: Secondary | ICD-10-CM | POA: Diagnosis not present

## 2022-08-31 DIAGNOSIS — I509 Heart failure, unspecified: Secondary | ICD-10-CM

## 2022-08-31 DIAGNOSIS — I5033 Acute on chronic diastolic (congestive) heart failure: Secondary | ICD-10-CM

## 2022-08-31 DIAGNOSIS — N390 Urinary tract infection, site not specified: Secondary | ICD-10-CM

## 2022-08-31 LAB — COMPREHENSIVE METABOLIC PANEL
ALT: 13 U/L (ref 0–44)
AST: 22 U/L (ref 15–41)
Albumin: 3.3 g/dL — ABNORMAL LOW (ref 3.5–5.0)
Alkaline Phosphatase: 71 U/L (ref 38–126)
Anion gap: 11 (ref 5–15)
BUN: 33 mg/dL — ABNORMAL HIGH (ref 8–23)
CO2: 26 mmol/L (ref 22–32)
Calcium: 8.6 mg/dL — ABNORMAL LOW (ref 8.9–10.3)
Chloride: 95 mmol/L — ABNORMAL LOW (ref 98–111)
Creatinine, Ser: 1.4 mg/dL — ABNORMAL HIGH (ref 0.44–1.00)
GFR, Estimated: 36 mL/min — ABNORMAL LOW (ref >60)
Glucose, Bld: 171 mg/dL — ABNORMAL HIGH (ref 70–99)
Potassium: 4.5 mmol/L (ref 3.5–5.1)
Sodium: 132 mmol/L — ABNORMAL LOW (ref 135–145)
Total Bilirubin: 1.8 mg/dL — ABNORMAL HIGH (ref 0.3–1.2)
Total Protein: 7 g/dL (ref 6.5–8.1)

## 2022-08-31 LAB — ECHOCARDIOGRAM COMPLETE
AR max vel: 1.85 cm2
AV Peak grad: 12.6 mmHg
Ao pk vel: 1.78 m/s
Area-P 1/2: 2.07 cm2
Height: 63 in
MV M vel: 4.58 m/s
MV Peak grad: 83.7 mmHg
MV VTI: 0.94 cm2
P 1/2 time: 432 msec
S' Lateral: 2.85 cm
Weight: 1918.88 oz

## 2022-08-31 LAB — CBC
HCT: 47.5 % — ABNORMAL HIGH (ref 36.0–46.0)
Hemoglobin: 14.9 g/dL (ref 12.0–15.0)
MCH: 33.3 pg (ref 26.0–34.0)
MCHC: 31.4 g/dL (ref 30.0–36.0)
MCV: 106.3 fL — ABNORMAL HIGH (ref 80.0–100.0)
Platelets: 246 10*3/uL (ref 150–400)
RBC: 4.47 MIL/uL (ref 3.87–5.11)
RDW: 15.8 % — ABNORMAL HIGH (ref 11.5–15.5)
WBC: 12.8 10*3/uL — ABNORMAL HIGH (ref 4.0–10.5)
nRBC: 0.2 % (ref 0.0–0.2)

## 2022-08-31 LAB — TROPONIN I (HIGH SENSITIVITY)
Troponin I (High Sensitivity): 32 ng/L — ABNORMAL HIGH (ref <18)
Troponin I (High Sensitivity): 39 ng/L — ABNORMAL HIGH (ref <18)

## 2022-08-31 LAB — MAGNESIUM: Magnesium: 2 mg/dL (ref 1.7–2.4)

## 2022-08-31 MED ORDER — SODIUM CHLORIDE 0.9 % IV SOLN
1.0000 g | INTRAVENOUS | Status: DC
  Administered 2022-08-31: 1 g via INTRAVENOUS
  Filled 2022-08-31: qty 10

## 2022-08-31 MED ORDER — SODIUM CHLORIDE 0.9 % IV SOLN
2.0000 g | INTRAVENOUS | Status: DC
  Administered 2022-09-01 – 2022-09-02 (×2): 2 g via INTRAVENOUS
  Filled 2022-08-31 (×2): qty 20

## 2022-08-31 MED ORDER — ADULT MULTIVITAMIN W/MINERALS CH
1.0000 | ORAL_TABLET | Freq: Every day | ORAL | Status: DC
  Administered 2022-08-31 – 2022-09-04 (×5): 1 via ORAL
  Filled 2022-08-31 (×5): qty 1

## 2022-08-31 MED ORDER — MAGNESIUM OXIDE -MG SUPPLEMENT 400 (240 MG) MG PO TABS
400.0000 mg | ORAL_TABLET | Freq: Every day | ORAL | Status: DC
  Administered 2022-08-31 – 2022-09-03 (×4): 400 mg via ORAL
  Filled 2022-08-31 (×4): qty 1

## 2022-08-31 MED ORDER — LIP MEDEX EX OINT
TOPICAL_OINTMENT | CUTANEOUS | Status: DC | PRN
  Filled 2022-08-31: qty 7

## 2022-08-31 MED ORDER — ENSURE ENLIVE PO LIQD
237.0000 mL | Freq: Two times a day (BID) | ORAL | Status: DC
  Administered 2022-08-31 – 2022-09-04 (×6): 237 mL via ORAL

## 2022-08-31 MED ORDER — ONDANSETRON HCL 4 MG/2ML IJ SOLN
4.0000 mg | Freq: Once | INTRAMUSCULAR | Status: AC
  Administered 2022-08-31: 4 mg via INTRAVENOUS
  Filled 2022-08-31: qty 2

## 2022-08-31 MED ORDER — BISACODYL 10 MG RE SUPP
10.0000 mg | Freq: Once | RECTAL | Status: AC
  Administered 2022-08-31: 10 mg via RECTAL
  Filled 2022-08-31: qty 1

## 2022-08-31 NOTE — TOC Initial Note (Signed)
Transition of Care [facility]) - Initial/Assessment Note    Patient Details  Name: [name] MRN: [id number] Date of Birth: May 04, 1932  Transition [facility]) CM/SW Contact:    Roseanne Kaufman, RN Phone Number: 08/31/2022, 4:42 PM  Clinical Narrative:  Jackquline [place] consult for CHF screening, patient is not appropriate for CHF protocol.       Spoke with patient's daughter: [name] to advise someone with palliative will speak with her tomorrow. Notified Dr. Hilma Favors that patient's daughter will stay overnight and be available for additional palliative discussion in the am.  Patient's daughter reports patient has not had any hh services and uses a walker PRN, patient has had a recent decline.   TOC will continue to follow.               Barriers to Discharge: Continued Medical Work up   Patient Goals and CMS Choice     Choice offered to / list presented to : NA  Expected Discharge Plan and Services   In-house Referral: NA Discharge Planning Services: CM Consult   Living arrangements for the past 2 months: Apartment                                      Prior Living Arrangements/Services Living arrangements for the past 2 months: Apartment Lives with:: Self Patient language and need for interpreter reviewed:: Yes Do you feel safe going back to the place where you live?: Yes      Need for Family Participation in Patient Care: No (Comment) Care giver support system in place?: Yes (comment)   Criminal Activity/Legal Involvement Pertinent to Current Situation/Hospitalization: No - Comment as needed  Activities of Daily Living Home Assistive Devices/Equipment: Walker (specify type) ADL Screening (condition at time of admission) Patient's cognitive ability adequate to safely complete daily activities?: Yes Is the patient deaf or have difficulty hearing?: No Does the patient have difficulty seeing, even when wearing glasses/contacts?: No Does the patient have  difficulty concentrating, remembering, or making decisions?: No Patient able to express need for assistance with ADLs?: Yes Does the patient have difficulty dressing or bathing?: No Independently performs ADLs?: Yes (appropriate for developmental age) Does the patient have difficulty walking or climbing stairs?: Yes Weakness of Legs: Both Weakness of Arms/Hands: None  Permission Sought/Granted Permission sought to share information with : Case Manager Permission granted to share information with : Yes, Verbal Permission Granted  Share Information with NAME: Case Manager, Daughter: [name]           Emotional Assessment Appearance:: Appears stated age Attitude/Demeanor/Rapport: Gracious Affect (typically observed): Accepting Orientation: : Oriented to Self, Oriented to Place Alcohol / Substance Use: Not Applicable Psych Involvement: No (comment)  Admission diagnosis:  Acute respiratory failure with hypoxia (HCC) [J96.01] Acute on chronic diastolic CHF (congestive heart failure) (HCC) [I50.33] Acute on chronic congestive heart failure, unspecified heart failure type ([place]) [I50.9] Patient Active Problem List   Diagnosis Date Noted   UTI (urinary tract infection) 08/31/2022   Acute on chronic congestive heart failure (HCC)    Acute on chronic diastolic CHF (congestive heart failure) ([place]) 08/30/2022   Elevated troponin 08/30/2022   Acute respiratory failure with hypoxia ([place]) 08/30/2022   Anemia 06/30/2020   Immunosuppressed status ([place]) 06/02/2019   History of skin cancer 05/03/2017   CKD (chronic kidney disease), stage III ([place]) 05/03/2017   Mitral valve insufficiency and aortic valve  insufficiency    GERD (gastroesophageal reflux disease) 12/13/2014   Dizziness 05/17/2014   Hyponatremia 05/17/2014   Seizure disorder ([place]) 09/21/2013   Aortic insufficiency 12/03/2012   Insomnia 06/08/2010   Liver replaced by transplant ([place]) 06/08/2010   Hepatic cirrhosis ([place]) 06/09/2007    Osteoporosis 06/09/2007   PCP:  Marin Olp, MD Pharmacy:   [facility] ([place], [facility] [address] [place] [address] [place] [address] Phone: [phone] Fax: [phone]  [facility] [place] [id number] [place], [place] - [place] [address] Phone: [phone] Fax: [phone]     Social Determinants of Health (SDOH) Interventions    Readmission Risk Interventions     No data to display

## 2022-08-31 NOTE — Progress Notes (Signed)
PT Cancellation Note  Patient Details Name: [name] MRN: [id number] DOB: [birth year]   Cancelled Treatment:    Reason Eval/Treat Not Completed: Fatigue/lethargy limiting ability to participate Patient  sleeping soundly, note palliative following, will check back tomorrow for PT needs. [place] Office [phone] Weekend pager-[phone]  Claretha Cooper 08/31/2022, 4:22 PM

## 2022-08-31 NOTE — ED Notes (Signed)
Secure message sent to receiving RN Jake RimandoJake Rimando , awaiting response. Pt has an inpatient bed at this time. Bed is currently "dirty", purple man is "green" RN has been assigned. Attempting report so when bed is "ready, pt can be transfer to unit"

## 2022-08-31 NOTE — ED Notes (Signed)
Pt's inpatient bed has been removed, pt will need to wait for another open inpatient bed at this time.

## 2022-08-31 NOTE — ED Notes (Signed)
Pt was moved from ED stretcher to hospital bed for comfort

## 2022-08-31 NOTE — Consult Note (Cosign Needed Addendum)
Consultation Note Date: 08/31/2022   Patient Name: [name]  DOB: March 21, 1932  MRN: [id number]  Age / Sex: 88 y.o., female  PCP: Marin Olp, MD Referring Physician: Eugenie Filler, MD  Reason for Consultation:   HPI/Patient Profile: 88 y.o. female  with past medical history of RBBB, history of seizure, liver cirrhosis s/p transplant at age 13, history of mild AI and severe MR who presented with dyspnea and hypoxia admitted on 08/30/2022 with acute heart failure exacerbation.   Primary Decision Maker PATIENT  Discussion: I met with patient and son, Abe People, at bedside. She states that she has had increasing shortness of breath and fatigue over the last few days. She lives in an apartment in [place] and her daughter, [name] and Abe People live close by. Her other daughter, who was a Marine scientist, passed away a few years ago due to complications of COVID. She enjoys spending time with her family. She was told at 35 that she would die without a liver transplant and is grateful that she was able to get one and have an extra 15 years with her family. She understands that she is not a surgical candidate for valve replacement and that this is life limiting for her. We talked about progression of heart failure likely leading to higher symptom burden over time. Her daughter [name] is her POA and they have talked about some of the options that Ms. Tewksbury would and would not want. Her husband passed away at [facility] and [facility] notes that family had a positive experience there.  SUMMARY OF RECOMMENDATIONS   Severe mitral valve regurgitation Acute diastolic heart failure Patient and her son have a good understanding of heart failure and that this is a life limiting process. We talked about prognosis and what progression of disease may look like. They would be interested in outpatient palliative consult. Depending on  echo and hospital course/ needs at home she may also be appropriate for hospice. [name] is POA and primary caregiver and went home for a few hours to get rest. I talked with Abe People and Ms. Mink about a MOST form and they were given form to review. -plan to check back in with patient and POA, [name] 9/30.  Code Status/Advance Care Planning: DNR   Prognosis:   Unable to determine  Discharge Planning: Home with Palliative Services  Primary Diagnoses: Present on Admission:  Acute on chronic diastolic CHF (congestive heart failure) (HCC)  Hepatic cirrhosis ([place])  Mitral valve insufficiency and aortic valve insufficiency  CKD (chronic kidney disease), stage III (HCC)  Elevated troponin  Acute respiratory failure with hypoxia (HCC)  UTI (urinary tract infection)   Review of Systems  Constitutional:  Positive for activity change and fatigue.  Respiratory:  Positive for shortness of breath.     Physical Exam HENT:     Mouth/Throat:     Mouth: Mucous membranes are moist.  Cardiovascular:     Rate and Rhythm: Normal rate and regular rhythm.     Pulses: Normal pulses.  Heart sounds: Normal heart sounds.  Pulmonary:     Breath sounds: Rales present.     Comments: In lower lung bases Abdominal:     General: Abdomen is flat.     Palpations: Abdomen is soft.  Musculoskeletal:        General: No swelling.  Skin:    General: Skin is warm and dry.  Neurological:     General: No focal deficit present.     Mental Status: She is alert. Mental status is at baseline.     Vital Signs: BP (!) 119/56 (BP Location: Left Arm)   Pulse 100   Temp 98.1 F (36.7 C) (Oral)   Resp (!) 21   Ht 5' 3"  (1.6 m)   Wt 54.4 kg   LMP  (LMP Unknown)   SpO2 92%   BMI 21.24 kg/m  Pain Scale: 0-10   Pain Score: 0-No pain   SpO2: SpO2: 92 % O2 Device:SpO2: 92 % O2 Flow Rate: .O2 Flow Rate (L/min): 3 L/min  IO: Intake/output summary:  Intake/Output Summary (Last 24 hours) at 08/31/2022  1001 Last data filed at 08/31/2022 0450 Gross per 24 hour  Intake 603.33 ml  Output --  Net 603.33 ml    LBM: Last BM Date :  (UTA) Baseline Weight: Weight: 54.4 kg Most recent weight: Weight: 54.4 kg       Thank you for this consult. Palliative medicine will continue to follow and assist as needed.   Signed by: Daleen Bo. Atilano Covelli, D.O.  Internal Medicine Resident, PGY-2 [facility]    Please contact Palliative Medicine Team phone at [phone] for questions and concerns.  For individual provider: See Shea Evans

## 2022-08-31 NOTE — Progress Notes (Signed)
PROGRESS NOTE    [name]  BJY:[id number] DOB: 26-Jan-1932 DOA: 08/30/2022 PCP: Marin Olp, MD   Chief Complaint  Patient presents with   Fatigue   Diarrhea    Brief Narrative:  Patient is a pleasant 88 year old female with history significant of diastolic CHF, seizure disorder on Keppra, history of cirrhosis status post liver transplant on Prograf, CKD stage III, degenerative mitral valve disease with severe MVR who was admitted with acute respiratory failure with hypoxia secondary to acute on chronic diastolic failure which was likely in the setting of worsening mitral valve disease, UTI placed on IV antibiotics.  Cardiology consulted and following.    Assessment & Plan:   Principal Problem:   Acute on chronic diastolic CHF (congestive heart failure) (HCC) Active Problems:   Hepatic cirrhosis (HCC)   Liver replaced by transplant ([place])   Seizure disorder ([place])   Mitral valve insufficiency and aortic valve insufficiency   CKD (chronic kidney disease), stage III (HCC)   Elevated troponin   Acute respiratory failure with hypoxia (HCC)   UTI (urinary tract infection)  #1 acute respiratory failure with hypoxia secondary to acute on chronic diastolic CHF exacerbation likely secondary to worsening severe MVR. -Patient admitted with shortness of breath, generalized fatigue, noted to be hypoxic on room air. -BNP on admission noted to be 1397. -Chest x-ray with cardiomegaly with bilateral airspace disease, likely edema/CHF. -CT angiogram chest done negative for PE, however with cardiomegaly with bilateral airspace disease and effusions likely CHF, CAD, aortic atherosclerosis noted. -2D echo ordered and done this morning with a EF > 08%,MVHQ, grade 2 diastolic dysfunction, severely dilated left atrial size, severe MVR, mild to moderate TVR, moderate AVR. -Patient currently on 3 L nasal cannula with sats ranging from 92 to 97%. -Patient currently on Lasix 40 mg IV every 12  hours. -Patient seen in consultation by cardiology and feels patient's acute CHF likely secondary to worsening mitral valve disease, it is noted per cardiology that patient with no options for cardiac surgery.  Patient also noted not to be a good candidate for mitral valve clipping.  Cardiology recommending continuation of IV diuresis with palliative care consultation. -Palliative care consulted. -Supportive care.  2.  Worsening severe MVR -Patient seen by cardiology felt not to be a candidate for surgery including MitraClip. -Repeat 2D echo done during this hospitalization with severe MVR, severely dilated left atrial size, mild to moderate TVR, moderate AVR, EF > 46%, grade 2 diastolic dysfunction. -Cardiology recommending palliative care consultation. -Cardiology following.  3.  UTI -Urine cultures pending. -Continue IV Rocephin.  4.  Seizure disorder -Stable. -Continue home regimen Keppra 250 mg twice daily.  5.  CKD stage IIIb -Stable. -Monitor with diuresis. -Avoid nephrotoxic agents such as NSAIDs, vancomycin and Zosyn combo, avoid hypotension.  6.  Elevated troponin -Elevated but flat. -Like secondary to acute CHF exacerbation. -Cardiology following.  7.  History of liver cirrhosis status post transplant at [facility] 15 years ago -Stable -Outpatient follow-up.   DVT prophylaxis: SCDs Code Status: DNR (discussed with daughter, POA, son, patient at bedside) Family Communication: Updated patient, son, daughter at bedside. Disposition: TBD  Status is: Inpatient Remains inpatient appropriate because: Required IV diuresis, and acute respiratory failure with hypoxia, severity of illness.   Consultants:  Palliative care Christiana Fuchs, DO 08/31/2022 Cardiology: Dr.O'Neal 08/31/2022  Procedures:  2D echo 08/31/2022 CT angiogram chest 08/30/2022 CT abdomen and pelvis 08/30/2022 Chest x-ray 08/30/2022  Antimicrobials:  IV Rocephin 08/30/2022>>>>   Subjective: Sitting up  in  bed.  Complaining of fatigue.  Complains of nausea.  No chest pain.  Complain of lower abdominal pain.  Denies any dysuria.  Son and daughter at bedside.  Objective: Vitals:   08/31/22 0500 08/31/22 0600 08/31/22 0731 08/31/22 0838  BP: (!) 128/57 98/71  (!) 119/56  Pulse: (!) 102 (!) 101 100 100  Resp: 17 16  (!) 21  Temp:   98.2 F (36.8 C) 98.1 F (36.7 C)  TempSrc:   Axillary Oral  SpO2: 96% 95% 94% 92%  Weight:    54.4 kg  Height:    '5\' 3"'$  (1.6 m)    Intake/Output Summary (Last 24 hours) at 08/31/2022 1109 Last data filed at 08/31/2022 0450 Gross per 24 hour  Intake 603.33 ml  Output --  Net 603.33 ml   Filed Weights   08/31/22 0838  Weight: 54.4 kg    Examination:  General exam: NAD Respiratory system: Diffuse crackles.  No wheezing.  No rhonchi.  Fair air movement.   Cardiovascular system: Regular rate rhythm no murmurs rubs or gallops.  No lower extremity edema.  Gastrointestinal system: Abdomen is nondistended, soft and nontender. No organomegaly or masses felt. Normal bowel sounds heard. Central nervous system: Alert and oriented. No focal neurological deficits. Extremities: Symmetric 5 x 5 power. Skin: No rashes, lesions or ulcers Psychiatry: Judgement and insight appear normal. Mood & affect appropriate.     Data Reviewed: I have personally reviewed following labs and imaging studies  CBC: Recent Labs  Lab 08/28/22 1614 08/30/22 1524 08/30/22 1643 08/31/22 0100  WBC 5.2 3.9* 3.9* 12.8*  NEUTROABS 3.6  --   --   --   HGB 13.9 14.2 13.7 14.9  HCT 43.9 43.6 44.5 47.5*  MCV 105.3* 102.1* 107.0* 106.3*  PLT 220.0 224.0 202 814    Basic Metabolic Panel: Recent Labs  Lab 08/28/22 1614 08/30/22 1524 08/30/22 1643 08/31/22 0100  NA 139 133* 134* 132*  K 6.2* 4.5 4.7 4.5  CL 100 96 98 95*  CO2 34* '31 30 26  '$ GLUCOSE 115* 94 92 171*  BUN 46* 33* 33* 33*  CREATININE 1.57* 1.30* 1.24* 1.40*  CALCIUM 10.6* 9.5 9.0 8.6*  MG  --   --   --  2.0     GFR: Estimated Creatinine Clearance: 23.4 mL/min (A) (by C-G formula based on SCr of 1.4 mg/dL (H)).  Liver Function Tests: Recent Labs  Lab 08/28/22 1614 08/30/22 1643 08/31/22 0100  AST '19 19 22  '$ ALT '12 13 13  '$ ALKPHOS 72 66 71  BILITOT 0.9 1.2 1.8*  PROT 7.4 6.5 7.0  ALBUMIN 3.6 3.1* 3.3*    CBG: No results for input(s): "GLUCAP" in the last 168 hours.   Recent Results (from the past 240 hour(s))  SARS Coronavirus 2 by RT PCR (hospital order, performed in [facility] hospital lab) *cepheid single result test* Anterior Nasal Swab     Status: None   Collection Time: 08/30/22  4:49 PM   Specimen: Anterior Nasal Swab  Result Value Ref Range Status   SARS Coronavirus 2 by RT PCR NEGATIVE NEGATIVE Final    Comment: (NOTE) SARS-CoV-2 target nucleic acids are NOT DETECTED.  The SARS-CoV-2 RNA is generally detectable in upper and lower respiratory specimens during the acute phase of infection. The lowest concentration of SARS-CoV-2 viral copies this assay can detect is 250 copies / mL. A negative result does not preclude SARS-CoV-2 infection and should not be used as the sole basis  for treatment or other patient management decisions.  A negative result may occur with improper specimen collection / handling, submission of specimen other than nasopharyngeal swab, presence of viral mutation(s) within the areas targeted by this assay, and inadequate number of viral copies (<250 copies / mL). A negative result must be combined with clinical observations, patient history, and epidemiological information.  Fact Sheet for Patients:   https://www.patel.info/  Fact Sheet for Healthcare Providers: https://hall.com/  This test is not yet approved or  cleared by the [place] FDA and has been authorized for detection and/or diagnosis of SARS-CoV-2 by FDA under an Emergency Use Authorization (EUA).  This EUA will remain in effect  (meaning this test can be used) for the duration of the COVID-19 declaration under Section 564(b)(1) of the Act, 21 U.S.C. section 360bbb-3(b)(1), unless the authorization is terminated or revoked sooner.  Performed at [facility], [place] [address], [place], [place] [address]          Radiology Studies: CT ABDOMEN PELVIS W CONTRAST  Result Date: 08/30/2022 CLINICAL DATA:  Abdominal pain, acute, nonlocalized EXAM: CT ABDOMEN AND PELVIS WITH CONTRAST TECHNIQUE: Multidetector CT imaging of the abdomen and pelvis was performed using the standard protocol following bolus administration of intravenous contrast. RADIATION DOSE REDUCTION: This exam was performed according to the departmental dose-optimization program which includes automated exposure control, adjustment of the mA and/or kV according to patient size and/or use of iterative reconstruction technique. CONTRAST:  156m OMNIPAQUE IOHEXOL 350 MG/ML SOLN COMPARISON:  11/13/2004 FINDINGS: Lower chest: See chest CT report Hepatobiliary: Heterogeneous enhancement throughout the liver without focal lesion. Prior cholecystectomy. Common bile duct is dilated measuring 14 mm, likely related to age and post cholecystectomy state. Pancreas: Atrophy.  No focal abnormality or ductal dilatation. Spleen: 11 mm low-density lesion in superior aspect of the spleen, likely small cyst or hemangioma. Normal size. Adrenals/Urinary Tract: No adrenal abnormality. No focal renal abnormality. No stones or hydronephrosis. Urinary bladder is unremarkable. Stomach/Bowel: Stomach, large and small bowel grossly unremarkable. Vascular/Lymphatic: Heavily calcified aorta and iliac vessels. No evidence of aneurysm or adenopathy. Reproductive: No visible focal abnormality. Other: No free fluid or free air. Musculoskeletal: No acute bony abnormality. Old healed left inferior pubic ramus fracture. IMPRESSION: No acute findings in the abdomen or pelvis. Aortoiliac  atherosclerosis. Electronically Signed   By: KRolm BaptiseM.D.   On: 08/30/2022 17:25   CT Angio Chest PE W and/or Wo Contrast  Result Date: 08/30/2022 CLINICAL DATA:  Pulmonary embolism (PE) suspected, high prob EXAM: CT ANGIOGRAPHY CHEST WITH CONTRAST TECHNIQUE: Multidetector CT imaging of the chest was performed using the standard protocol during bolus administration of intravenous contrast. Multiplanar CT image reconstructions and MIPs were obtained to evaluate the vascular anatomy. RADIATION DOSE REDUCTION: This exam was performed according to the departmental dose-optimization program which includes automated exposure control, adjustment of the mA and/or kV according to patient size and/or use of iterative reconstruction technique. CONTRAST:  1082mOMNIPAQUE IOHEXOL 350 MG/ML SOLN COMPARISON:  06/13/2006 FINDINGS: Cardiovascular: Cardiomegaly. Densely calcified mitral valve annulus, as well as coronary arteries and scattered aortic calcifications. No aneurysm. No filling defects in the pulmonary arteries to suggest pulmonary emboli. Mediastinum/Nodes: No mediastinal, hilar, or axillary adenopathy. Trachea and esophagus are unremarkable. Thyroid unremarkable. Lungs/Pleura: Small to moderate bilateral effusions, right greater than left. Bilateral ground-glass airspace opacities, likely edema/CHF. Upper Abdomen: No acute findings Musculoskeletal: Chest wall soft tissues are unremarkable. No acute bony abnormality. Review of the MIP images confirms the  above findings. IMPRESSION: No evidence of pulmonary embolus. Cardiomegaly with bilateral airspace disease and effusions, likely CHF. Coronary artery disease. Aortic Atherosclerosis (ICD10-I70.0). Electronically Signed   By: Rolm Baptise M.D.   On: 08/30/2022 17:22   DG Chest 2 View  Result Date: 08/30/2022 CLINICAL DATA:  Fatigue, low oxygen EXAM: CHEST - 2 VIEW COMPARISON:  04/04/2018 FINDINGS: Cardiomegaly. Bilateral airspace opacities concerning for  edema. No effusions or pneumothorax. No acute bony abnormality. Aortic atherosclerosis. Dense mitral valve annular calcifications. IMPRESSION: Cardiomegaly with bilateral airspace disease, likely edema/CHF. Electronically Signed   By: Rolm Baptise M.D.   On: 08/30/2022 17:16        Scheduled Meds:  furosemide  40 mg Intravenous Q12H   levETIRAcetam  250 mg Oral BID   magnesium oxide  400 mg Oral QHS   sodium chloride flush  3 mL Intravenous Q12H   tacrolimus  2 mg Oral Daily   tacrolimus  3 mg Oral QHS   Continuous Infusions:  sodium chloride     [START ON 09/01/2022] cefTRIAXone (ROCEPHIN)  IV     nitroGLYCERIN 15 mcg/min (08/31/22 0240)     LOS: 1 day    Time spent: 40 minutes    Irine Seal, MD [facility]   To contact the attending provider between 7A-7P or the covering provider during after hours 7P-7A, please log into the web site [email or web address] and access using universal [place] password for that web site. If you do not have the password, please call the hospital operator.  08/31/2022, 11:09 AM

## 2022-08-31 NOTE — ED Notes (Signed)
Pt resting on hospital bed, lights turned down for comfort, pt on cardiac monitoring device, vitals stable at this time, NAD noted, observed even RR and unlabored, side rails up x2 for safety, plan of care ongoing, pt expresses no needs or concerns at this time, call light within reach, no further concerns as of present. Daughter remains at the bedside

## 2022-08-31 NOTE — Progress Notes (Signed)
No need of bipap at this time. No resp distress noted.  

## 2022-08-31 NOTE — Consult Note (Addendum)
Cardiology Consultation   Patient ID: [name] MRN: [id number]; DOB: 02/21/1932  Admit date: 08/30/2022 Date of Consult: 08/31/2022  PCP:  Marin Olp, [facility] Cardiologist:  Jenkins Rouge, MD        Patient Profile:   [name] is a 88 y.o. female with a hx of RBBB, varicose veins, history of seizure, liver cirrhosis s/p transplant at [facility] at age 4, prior history of EtOH abuse, history of mild AI and severe MR who is being seen 08/31/2022 for the evaluation of dyspnea and hypoxia at the request of Dr. Roel Cluck.  History of Present Illness:   [name] is a 88 year old female with past medical history of RBBB, varicose veins, history of seizure, liver cirrhosis s/p transplant at [facility] at age 53, prior history of EtOH abuse, history of mild AI and severe MR.  Patient has been followed by Dr. Johnsie Cancel.  Previous echocardiogram in September 2020 confirmed severe MR and mild AS/AI.  She was asymptomatic was MR at the time.  She was felt not to be a MitraClip candidate.  She has severe mitral valve calcium.  Dr. Johnsie Cancel discussed with the patient possibility of developing atrial fibrillation in the setting worsening mitral valve down the road.  Previous heart monitor obtained in October 2020 and again in March 2023 has not shown any atrial fibrillation, she does have self-limiting short bursts of SVT.  Patient is up-to-date with COVID-19 vaccine.  She was last seen by Dr. Admission on 04/05/2022 at which time she had worsening functional ability with more exertional dyspnea.  Dr. Johnsie Cancel recommended a repeat echocardiogram.  Echocardiogram performed on 04/25/2022 showed EF 65 to 70%, RVSP 68 mmHg, severe LAE, mild RAE, severe MR with markedly increased forward flow, mitral valve was myxomatous with severe mitral annular calcification, mean mitral valve gradient 12 mmHg with average heart rate 99 bpm, moderate to severe TR, moderate AI.    In the past week,  patient has been having progressive weakness.  She also has some diarrhea as well.  Initially, patient was seen by her PCP on Tuesday and recommended blood work to make sure she is not dehydrated.  Unfortunately, they had to repeat the blood work a space metabolic panel showed potassium of 6.2, creatinine of 1.57.  When she came back for repeat blood work yesterday, she developed significant weakness, shortness of breath and hypoxia prompting additional evaluation.  O2 saturation was also reportedly to be low at 86% yesterday on room air.  She is currently on 3 L nasal cannula.  According to daughter, she has never required oxygen at home before.  She is somewhat independent, although son and daughter help her to pick up the mail, she was able to ambulate at home and do majority of the stuff at home without issue prior to a week ago.  Blood work obtained yesterday showed sodium 133, potassium has improved from the previous 6.2 to 4.5, creatinine 1.3.  White blood cell count 3.9.  EKG showed normal sinus rhythm.  CT of abdomen showed no acute finding.  CTA of the chest obtained yesterday showed no evidence of PE, cardiomegaly with bilateral airspace disease and effusion likely point to CHF.  Repeat blood work this morning showed white blood cell count has increased to 12.8.  Hemoglobin 14.9.  Creatinine 1.4.  Overnight, there was no documentation of urine output.  Urinalysis obtained last night showed positive nitrite and large leukocytes with many bacteria.  She has been started on antibiotic.   Past Medical History:  Diagnosis Date   Cirrhosis [facility])    s/p Liver Transplant - follow at [facility]   History of cardiac catheterization    a. LHC 06/2006: EF 65-70%, pLAD 20%   History of GI bleed    patient denies   Mitral valve insufficiency and aortic valve insufficiency    a. Echo 3/14:  Mild LVH, EF 55-65%, Gr 1 DD, moderate AI, MAC, mild mitral stenosis, mild to mod MR, mod LAE, PASP 34 mmHg //  b. Echo 6/15:  Mild LVH, EF 55-60%, Gr 2 DD, mode AI, MAC, trivial mitral stenosis (mean gradient 5 mmHg), mod MR, mod LAE, PASP 30 mmHg  //  c. Echo 6/16: EF 55-60%, no RWMA, Gr 2 DD, mod AI, mildly dilated Asc aorta (38 mm), MAC, mod MS, moderate MR, PASP 31 mmHg    Osteoporosis    Seizure disorder ([place])    followed by Neuro    Past Surgical History:  Procedure Laterality Date   LIVER TRANSPLANT  08/08     Home Medications:  Prior to Admission medications   Medication Sig Start Date End Date Taking? Authorizing Provider  alendronate (FOSAMAX) 70 MG tablet TAKE 1 TABLET BY MOUTH  WEEKLY WITH 8 OZ OF PLAIN  WATER 30 MINUTES BEFORE  FIRST FOOD, DRINK OR MEDS.  STAY UPRIGHT FOR 30 MINS Patient taking differently: Take 70 mg by mouth every Thursday. 05/31/22  Yes Marin Olp, MD  Calcium Carb-Cholecalciferol (CALCIUM+D3 PO) Take 1 tablet by mouth daily.   Yes [provider]  Cyanocobalamin (B-12 PO) Take 1 tablet by mouth every Thursday.   Yes [provider]  levETIRAcetam (KEPPRA) 250 MG tablet TAKE 1 TABLET BY MOUTH  TWICE DAILY Patient taking differently: Take 250 mg by mouth 2 (two) times daily. 10/18/21  Yes Marin Olp, MD  magnesium oxide (MAG-OX) 400 (240 Mg) MG tablet Take 400 mg by mouth at bedtime.   Yes [provider]  Multiple Vitamins-Minerals (ONE-A-DAY WOMENS 50+) TABS Take 1 tablet by mouth daily before breakfast.   Yes [provider]  tacrolimus (PROGRAF) 1 MG capsule Take 2-3 mg by mouth See admin instructions. Take 2 mg by mouth in the morning and 3 mg at bedtime   Yes [provider]  zolpidem (AMBIEN CR) 6.25 MG CR tablet TAKE ONE TABLET BY MOUTH EVERY NIGHT AT BEDTIME AS NEEDED FOR SLEEP Patient taking differently: Take 6.25 mg by mouth at bedtime. 07/16/22  Yes Marin Olp, MD  Biotin 5 MG CAPS Take 5 mg by mouth daily. 05/26/19   [provider]  carvedilol (COREG) 3.125 MG tablet TAKE ONE TABLET BY MOUTH TWICE  A DAY 08/31/22   Josue Hector, MD    Inpatient Medications: Scheduled Meds:  furosemide  40 mg Intravenous Q12H   levETIRAcetam  250 mg Oral BID   sodium chloride flush  3 mL Intravenous Q12H   tacrolimus  2 mg Oral Daily   tacrolimus  3 mg Oral QHS   Continuous Infusions:  sodium chloride     [START ON 09/01/2022] cefTRIAXone (ROCEPHIN)  IV     nitroGLYCERIN 15 mcg/min (08/31/22 0240)   PRN Meds: sodium chloride, acetaminophen **OR** acetaminophen, morphine injection, sodium chloride flush  Allergies:    Allergies  Allergen Reactions   Nsaids Other (See Comments)    Liver transplant precautions    Tape Other (See Comments)    TAPE IRRITATES  THE SKIN- is very sensitive!!   Codeine Nausea And Vomiting    Social History:   Social History   Socioeconomic History   Marital status: Widowed    Spouse name: [name]    Number of children: 3   Years of education: 12th   Highest education level: Not on file  Occupational History   Occupation: Retired  Tobacco Use   Smoking status: Former   Smokeless tobacco: Never  Scientific laboratory technician Use: Never used  Substance and Sexual Activity   Alcohol use: No    Comment: h/o alcohol abuse   Drug use: No   Sexual activity: Not on file  Other Topics Concern   Not on file  Social History Narrative   Patient lives at home with husband [name].    Daughter lives close and extremely supportive      Patient has 3 children. 4 grandchildren. No greatgrandchildren.    Patient has a high school education level.    Patient is retired from [employer], cared for her children      Hobbies: dancing ([place] singing group)-fewer and fewer get togethers   Social Determinants of Radio broadcast assistant Strain: Not on file  Food Insecurity: Not on file  Transportation Needs: Not on file  Physical Activity: Not on file  Stress: Not on file  Social Connections: Not on file  Intimate Partner Violence: Not on file    Family History:     Family History  Problem Relation Age of Onset   Heart attack Mother 14     ROS:  Please see the history of present illness.   All other ROS reviewed and negative.     Physical Exam/Data:   Vitals:   08/31/22 0500 08/31/22 0600 08/31/22 0731 08/31/22 0838  BP: (!) 128/57 98/71  (!) 119/56  Pulse: (!) 102 (!) 101 100 100  Resp: 17 16  (!) 21  Temp:   98.2 F (36.8 C) 98.1 F (36.7 C)  TempSrc:   Axillary Oral  SpO2: 96% 95% 94% 92%    Intake/Output Summary (Last 24 hours) at 08/31/2022 0908 Last data filed at 08/31/2022 0450 Gross per 24 hour  Intake 603.33 ml  Output --  Net 603.33 ml      08/28/2022    2:53 PM 04/05/2022    3:55 PM 09/15/2021    9:20 AM  Last 3 Weights  Weight (lbs) 114 lb 107 lb 6.4 oz 106 lb 9.6 oz  Weight (kg) 51.71 kg 48.716 kg 48.353 kg     There is no height or weight on file to calculate BMI.  General:  Well nourished, well developed, in no acute distress HEENT: normal Neck: no JVD Vascular: No carotid bruits; Distal pulses 2+ bilaterally Cardiac:  normal S1, S2; RRR; no murmur  Lungs: Fine crackles in the lower one third of the lungs. Abd: soft, nontender, no hepatomegaly  Ext: no edema Musculoskeletal:  No deformities, BUE and BLE strength normal and equal Skin: warm and dry  Neuro:  CNs 2-12 intact, no focal abnormalities noted Psych:  Normal affect   EKG:  The EKG was personally reviewed and demonstrates: Sinus rhythm, right bundle branch block Telemetry:  Telemetry was personally reviewed and demonstrates: Sinus rhythm, heart rate mildly elevated in the high 90s to low 100 range this morning.  Heart rate was in the 80s yesterday.  Relevant CV Studies:  Echo 04/25/2022  1. Left ventricular ejection fraction, by estimation, is 65 to  70%. The  left ventricle has normal function. The left ventricle has no regional  wall motion abnormalities. Left ventricular diastolic parameters are  consistent with Grade II diastolic  dysfunction  (pseudonormalization). Elevated left atrial pressure. The  average left ventricular global longitudinal strain is -23.0 %. The global  longitudinal strain is normal.   2. Right ventricular systolic function is normal. The right ventricular  size is normal. There is severely elevated pulmonary artery systolic  pressure. The estimated right ventricular systolic pressure is 79.3 mmHg.   3. Left atrial size was severely dilated.   4. Right atrial size was mildly dilated.   5. Mitral valve gradients are caused primarily by severe mitral  insufficiency with markedly increased forward flow. The mitral valve is  myxomatous. Severe mitral valve regurgitation. The mean mitral valve  gradient is 12.0 mmHg with average heart rate of   99 bpm. Severe mitral annular calcification.   6. The tricuspid valve is myxomatous. Tricuspid valve regurgitation is  moderate to severe.   7. The aortic valve is tricuspid. There is mild calcification of the  aortic valve. There is mild thickening of the aortic valve. Aortic valve  regurgitation is moderate. Aortic valve sclerosis is present, with no  evidence of aortic valve stenosis.   8. The inferior vena cava is normal in size with greater than 50%  respiratory variability, suggesting right atrial pressure of 3 mmHg.   Comparison(s): No significant change from prior study. Prior images  reviewed side by side.   Laboratory Data:  High Sensitivity Troponin:   Recent Labs  Lab 08/30/22 1643 08/30/22 1813 08/30/22 2300 08/31/22 0100  TROPONINIHS 34* 31* 32* 39*     Chemistry Recent Labs  Lab 08/30/22 1524 08/30/22 1643 08/31/22 0100  NA 133* 134* 132*  K 4.5 4.7 4.5  CL 96 98 95*  CO2 _0 GLUCOSE 94 92 171*  BUN 33* 33* 33*  CREATININE 1.30* 1.24* 1.40*  CALCIUM 9.5 9.0 8.6*  GFRNONAA  --  42* 36*  ANIONGAP  --  6 11    Recent Labs  Lab 08/28/22 1614 08/30/22 1643 08/31/22 0100  PROT 7.4 6.5 7.0  ALBUMIN 3.6 3.1* 3.3*  AST _1 ALT _2 ALKPHOS 72 66 71  BILITOT 0.9 1.2 1.8*   Lipids No results for input(s): "CHOL", "TRIG", "HDL", "LABVLDL", "LDLCALC", "CHOLHDL" in the last 168 hours.  Hematology Recent Labs  Lab 08/30/22 1524 08/30/22 1643 08/31/22 0100  WBC 3.9* 3.9* 12.8*  RBC 4.27 4.16 4.47  HGB 14.2 13.7 14.9  HCT 43.6 44.5 47.5*  MCV 102.1* 107.0* 106.3*  MCH  --  32.9 33.3  MCHC 32.5 30.8 31.4  RDW 16.8* 15.5 15.8*  PLT 224.0 202 246   Thyroid  Recent Labs  Lab 08/28/22 1614  TSH 1.01    BNP Recent Labs  Lab 08/30/22 1643  BNP 1,397.1*    DDimer No results for input(s): "DDIMER" in the last 168 hours.   Radiology/Studies:  CT ABDOMEN PELVIS W CONTRAST  Result Date: 08/30/2022 CLINICAL DATA:  Abdominal pain, acute, nonlocalized EXAM: CT ABDOMEN AND PELVIS WITH CONTRAST TECHNIQUE: Multidetector CT imaging of the abdomen and pelvis was performed using the standard protocol following bolus administration of intravenous contrast. RADIATION DOSE REDUCTION: This exam was performed according to the departmental dose-optimization program which includes automated exposure control, adjustment of the mA and/or kV according to patient size and/or use of iterative reconstruction technique. CONTRAST:  133m OMNIPAQUE IOHEXOL 350 MG/ML SOLN COMPARISON:  11/13/2004 FINDINGS: Lower chest: See chest CT report Hepatobiliary: Heterogeneous enhancement throughout the liver without focal lesion. Prior cholecystectomy. Common bile duct is dilated measuring 14 mm, likely related to age and post cholecystectomy state. Pancreas: Atrophy.  No focal abnormality or ductal dilatation. Spleen: 11 mm low-density lesion in superior aspect of the spleen, likely small cyst or hemangioma. Normal size. Adrenals/Urinary Tract: No adrenal abnormality. No focal renal abnormality. No stones or hydronephrosis. Urinary bladder is unremarkable. Stomach/Bowel: Stomach, large and small bowel grossly unremarkable.  Vascular/Lymphatic: Heavily calcified aorta and iliac vessels. No evidence of aneurysm or adenopathy. Reproductive: No visible focal abnormality. Other: No free fluid or free air. Musculoskeletal: No acute bony abnormality. Old healed left inferior pubic ramus fracture. IMPRESSION: No acute findings in the abdomen or pelvis. Aortoiliac atherosclerosis. Electronically Signed   By: KRolm BaptiseM.D.   On: 08/30/2022 17:25   CT Angio Chest PE W and/or Wo Contrast  Result Date: 08/30/2022 CLINICAL DATA:  Pulmonary embolism (PE) suspected, high prob EXAM: CT ANGIOGRAPHY CHEST WITH CONTRAST TECHNIQUE: Multidetector CT imaging of the chest was performed using the standard protocol during bolus administration of intravenous contrast. Multiplanar CT image reconstructions and MIPs were obtained to evaluate the vascular anatomy. RADIATION DOSE REDUCTION: This exam was performed according to the departmental dose-optimization program which includes automated exposure control, adjustment of the mA and/or kV according to patient size and/or use of iterative reconstruction technique. CONTRAST:  10108mOMNIPAQUE IOHEXOL 350 MG/ML SOLN COMPARISON:  06/13/2006 FINDINGS: Cardiovascular: Cardiomegaly. Densely calcified mitral valve annulus, as well as coronary arteries and scattered aortic calcifications. No aneurysm. No filling defects in the pulmonary arteries to suggest pulmonary emboli. Mediastinum/Nodes: No mediastinal, hilar, or axillary adenopathy. Trachea and esophagus are unremarkable. Thyroid unremarkable. Lungs/Pleura: Small to moderate bilateral effusions, right greater than left. Bilateral ground-glass airspace opacities, likely edema/CHF. Upper Abdomen: No acute findings Musculoskeletal: Chest wall soft tissues are unremarkable. No acute bony abnormality. Review of the MIP images confirms the above findings. IMPRESSION: No evidence of pulmonary embolus. Cardiomegaly with bilateral airspace disease and effusions, likely  CHF. Coronary artery disease. Aortic Atherosclerosis (ICD10-I70.0). Electronically Signed   By: KeRolm Baptise.D.   On: 08/30/2022 17:22   DG Chest 2 View  Result Date: 08/30/2022 CLINICAL DATA:  Fatigue, low oxygen EXAM: CHEST - 2 VIEW COMPARISON:  04/04/2018 FINDINGS: Cardiomegaly. Bilateral airspace opacities concerning for edema. No effusions or pneumothorax. No acute bony abnormality. Aortic atherosclerosis. Dense mitral valve annular calcifications. IMPRESSION: Cardiomegaly with bilateral airspace disease, likely edema/CHF. Electronically Signed   By: KeRolm Baptise.D.   On: 08/30/2022 17:16     Assessment and Plan:   Acute diastolic heart failure  -BNP 1397.  CT of the chest concerning for CHF.  On physical exam, patient has crackles in the lower one third of both lung.  No output is currently documented.  Patient received 40 mg of IV Lasix in the emergency room last night.  40 mg twice daily of Lasix has been ordered starting this morning, however morning dose has not been given.  I instructed the nurse to give the 40 mg IV Lasix now.  Pending repeat echocardiogram ordered by primary team.  Urinary tract infection: Managed per primary team.  Recently antibiotic.  Weakness: Likely due to combination of #1 and #2  Severe MR: Followed by Dr. NiJohnsie Cancelfelt not to be a candidate for surgery including MitraClip.  Repeat echocardiogram obtained on 04/25/2022 continue to  show severe MR and moderate AI.  No evidence of atrial fibrillation based on previous heart monitor in 2020 and March 2023.  No evidence of A-fib during this hospitalization.  Agree with palliative care discussion.  Moderate AI: Mildly worsened when compared to the previous echocardiogram  History of liver cirrhosis s/p transplant at [facility] 15 years ago.   Risk Assessment/Risk Scores:        New York Heart Association (NYHA) Functional Class NYHA Class IV        For questions or updates, please contact [place] Please consult [email or web address] for contact info under    Hilbert Corrigan, [place]  08/31/2022 9:08 AM

## 2022-08-31 NOTE — ED Notes (Signed)
Secure message sent to inpatient flor coverage Tonna Corner NP & Gershon Cull NP requesting to come speak to pt's daughter who is very concern for her mother. Pt daughter repeatedly on call bell for multiple request. Pt's daughter would like additional morphine to be administer "to lower my mother's hear rate as it is too hight and racing". HR on the monitor shows rate 100-105, regular, ST. Pt has PRN order for morphine for pain or "air hunger" Q4hrs, last dose midnight, pt will receive another dose as well. Daughter appears very anxious, understandable regarding her mother is here in the hospital. Daughter has asked multiple questions that nursing team cannot answer all of them appropriately that provider will need to address. Dr. Roel Cluck has spent a great deal of time with pt and daughter to answer all concerns. Daughter upset that cardiology has not been at the bedside this evening. Advised they may round in the am or after pt's echocardiogram. Daughter verbalized understanding.

## 2022-08-31 NOTE — Progress Notes (Signed)
Initial Nutrition Assessment  INTERVENTION:   -Ensure Plus High Protein po BID, each supplement provides 350 kcal and 20 grams of protein.   -Multivitamin with minerals daily  NUTRITION DIAGNOSIS:   Increased nutrient needs related to chronic illness as evidenced by estimated needs.  GOAL:   Patient will meet greater than or equal to 90% of their needs  MONITOR:   PO intake, Supplement acceptance, Labs, Weight trends, I & O's  REASON FOR ASSESSMENT:   Consult Assessment of nutrition requirement/status  ASSESSMENT:   88 y.o. female with medical history significant of     diastolic CHF, seizure disorder on Keppra, history of cirrhosis status post liver transplant on Prograf, aortic insufficiency, having cardiopulmonary syndrome  CKD stage 3.     Admitted for CHF and hypoxia.  Patient unavailable this morning, unable to gather history, will attempt at follow-up. Per chart review, pt has been having diarrhea for a week PTA and has been eating less than usual. Pt takes: calcium and Vitamin D supplements, B-12, Mg and  a women's MVI daily. Was NPO earlier today but now on full liquids. Have ordered Ensure supplements and daily MVI.  Per weight records, no weight loss was noted. Fluid may be masking any weight changes given history of CHF.   Medications: Lasix, Dulcolax, MAG-OX  Labs reviewed: Low Na   NUTRITION - FOCUSED PHYSICAL EXAM:  Unable to complete at this time.  Diet Order:   Diet Order             Diet full liquid Room service appropriate? Yes; Fluid consistency: Thin  Diet effective now                   EDUCATION NEEDS:   Not appropriate for education at this time  Skin:  Skin Assessment: Reviewed RN Assessment  Last BM:  PTA  Height:   Ht Readings from Last 1 Encounters:  08/31/22 '5\' 3"'$  (1.6 m)    Weight:   Wt Readings from Last 1 Encounters:  08/31/22 54.4 kg    BMI:  Body mass index is 21.24 kg/m.  Estimated Nutritional Needs:    Kcal:  1350-1550  Protein:  65-75g  Fluid:  1.5L/day  Clayton Bibles, MS, RD, LDN Inpatient Clinical Dietitian Contact information available via Amion

## 2022-08-31 NOTE — ED Notes (Signed)
Tonna Corner NP & Gershon Cull NP at the bedside to give update to pt and her daughter

## 2022-08-31 NOTE — Assessment & Plan Note (Signed)
-   treat with Rocephin         await results of urine culture and adjust antibiotic coverage as needed  

## 2022-09-01 DIAGNOSIS — R778 Other specified abnormalities of plasma proteins: Secondary | ICD-10-CM | POA: Diagnosis not present

## 2022-09-01 DIAGNOSIS — I5033 Acute on chronic diastolic (congestive) heart failure: Secondary | ICD-10-CM | POA: Diagnosis not present

## 2022-09-01 DIAGNOSIS — N183 Chronic kidney disease, stage 3 unspecified: Secondary | ICD-10-CM | POA: Diagnosis not present

## 2022-09-01 DIAGNOSIS — J9601 Acute respiratory failure with hypoxia: Secondary | ICD-10-CM | POA: Diagnosis not present

## 2022-09-01 DIAGNOSIS — I080 Rheumatic disorders of both mitral and aortic valves: Secondary | ICD-10-CM | POA: Diagnosis not present

## 2022-09-01 DIAGNOSIS — N1832 Chronic kidney disease, stage 3b: Secondary | ICD-10-CM | POA: Diagnosis not present

## 2022-09-01 LAB — CBC WITH DIFFERENTIAL/PLATELET
Abs Immature Granulocytes: 0.03 10*3/uL (ref 0.00–0.07)
Basophils Absolute: 0 10*3/uL (ref 0.0–0.1)
Basophils Relative: 0 %
Eosinophils Absolute: 0.2 10*3/uL (ref 0.0–0.5)
Eosinophils Relative: 2 %
HCT: 44.6 % (ref 36.0–46.0)
Hemoglobin: 13.7 g/dL (ref 12.0–15.0)
Immature Granulocytes: 0 %
Lymphocytes Relative: 9 %
Lymphs Abs: 0.7 10*3/uL (ref 0.7–4.0)
MCH: 33.1 pg (ref 26.0–34.0)
MCHC: 30.7 g/dL (ref 30.0–36.0)
MCV: 107.7 fL — ABNORMAL HIGH (ref 80.0–100.0)
Monocytes Absolute: 0.6 10*3/uL (ref 0.1–1.0)
Monocytes Relative: 7 %
Neutro Abs: 6.8 10*3/uL (ref 1.7–7.7)
Neutrophils Relative %: 82 %
Platelets: 165 10*3/uL (ref 150–400)
RBC: 4.14 MIL/uL (ref 3.87–5.11)
RDW: 15.7 % — ABNORMAL HIGH (ref 11.5–15.5)
WBC: 8.3 10*3/uL (ref 4.0–10.5)
nRBC: 0 % (ref 0.0–0.2)

## 2022-09-01 LAB — COMPREHENSIVE METABOLIC PANEL
ALT: 11 U/L (ref 0–44)
AST: 14 U/L — ABNORMAL LOW (ref 15–41)
Albumin: 2.7 g/dL — ABNORMAL LOW (ref 3.5–5.0)
Alkaline Phosphatase: 62 U/L (ref 38–126)
Anion gap: 10 (ref 5–15)
BUN: 41 mg/dL — ABNORMAL HIGH (ref 8–23)
CO2: 29 mmol/L (ref 22–32)
Calcium: 8.1 mg/dL — ABNORMAL LOW (ref 8.9–10.3)
Chloride: 97 mmol/L — ABNORMAL LOW (ref 98–111)
Creatinine, Ser: 2.01 mg/dL — ABNORMAL HIGH (ref 0.44–1.00)
GFR, Estimated: 24 mL/min — ABNORMAL LOW (ref >60)
Glucose, Bld: 84 mg/dL (ref 70–99)
Potassium: 5 mmol/L (ref 3.5–5.1)
Sodium: 136 mmol/L (ref 135–145)
Total Bilirubin: 0.7 mg/dL (ref 0.3–1.2)
Total Protein: 6.2 g/dL — ABNORMAL LOW (ref 6.5–8.1)

## 2022-09-01 LAB — FOLATE RBC: RBC Folate: 671 ng/mL RBC (ref >280)

## 2022-09-01 LAB — MAGNESIUM: Magnesium: 2.1 mg/dL (ref 1.7–2.4)

## 2022-09-01 MED ORDER — PROCHLORPERAZINE EDISYLATE 10 MG/2ML IJ SOLN
5.0000 mg | Freq: Four times a day (QID) | INTRAMUSCULAR | Status: DC | PRN
  Administered 2022-09-01: 5 mg via INTRAVENOUS
  Filled 2022-09-01: qty 2

## 2022-09-01 MED ORDER — ALBUMIN HUMAN 25 % IV SOLN
25.0000 g | Freq: Two times a day (BID) | INTRAVENOUS | Status: DC
  Administered 2022-09-01: 25 g via INTRAVENOUS
  Filled 2022-09-01: qty 100

## 2022-09-01 MED ORDER — OXYCODONE HCL 5 MG PO TABS
5.0000 mg | ORAL_TABLET | ORAL | Status: AC
  Administered 2022-09-01: 5 mg via ORAL
  Filled 2022-09-01: qty 1

## 2022-09-01 MED ORDER — ACETAMINOPHEN 500 MG PO TABS
1000.0000 mg | ORAL_TABLET | Freq: Three times a day (TID) | ORAL | Status: DC
  Administered 2022-09-01 – 2022-09-04 (×8): 1000 mg via ORAL
  Filled 2022-09-01 (×9): qty 2

## 2022-09-01 MED ORDER — ALBUMIN HUMAN 25 % IV SOLN
25.0000 g | Freq: Two times a day (BID) | INTRAVENOUS | Status: DC
  Administered 2022-09-01 – 2022-09-02 (×2): 25 g via INTRAVENOUS
  Filled 2022-09-01 (×2): qty 100

## 2022-09-01 MED ORDER — HYDROMORPHONE HCL 1 MG/ML IJ SOLN: 0.2500 mg | INTRAMUSCULAR | Status: DC | PRN

## 2022-09-01 MED ORDER — FUROSEMIDE 10 MG/ML IJ SOLN
80.0000 mg | Freq: Two times a day (BID) | INTRAMUSCULAR | Status: DC
  Administered 2022-09-01 (×2): 80 mg via INTRAVENOUS
  Filled 2022-09-01 (×2): qty 8

## 2022-09-01 MED ORDER — ZINC OXIDE 40 % EX OINT
TOPICAL_OINTMENT | Freq: Three times a day (TID) | CUTANEOUS | Status: DC
  Administered 2022-09-01 (×2): 1 via TOPICAL
  Filled 2022-09-01: qty 57

## 2022-09-01 NOTE — Evaluation (Signed)
Physical Therapy Evaluation Patient Details Name: [name] MRN: [id number] DOB: [birth year] Today's Date: 09/01/2022  History of Present Illness  Patient is a pleasant 88 year old female with history significant of diastolic CHF, seizure disorder on Keppra, history of cirrhosis status post liver transplant on Prograf, CKD stage III, degenerative mitral valve disease with severe MVR who was admitted 08/31/22  with acute respiratory failure with hypoxia secondary to acute on chronic diastolic failure which was likely in the setting of worsening mitral valve disease, UTI placed on IV antibiotics  Clinical Impression   The patient was admitted for above problems.  Patient resting in recliner, daughter present.  Patient  agreeable to ambulation. Patient ambulated on 2 L x  50' using RW.  Upon return to room, patient reported dizziness and fatigue.  SPO2 94%, HR 69, BP 125/57. Previously recorded BP at 1430-117/61.  Patient also had reported nausea prior to walking, no reports post walking.  Patient had pain medication prior to ambulation.  Continue PT for mobility as tolerated, monitor VS. Per daughter, plans are being discussed for patient's disposition. Note, Palliative med. Is following.  Pt admitted with above diagnosis.  Pt currently with functional limitations due to the deficits listed below (see PT Problem List). Pt will benefit from skilled PT to increase their independence and safety with mobility to allow discharge to the venue listed below.        Recommendations for follow up therapy are one component of a multi-disciplinary discharge planning process, led by the attending physician.  Recommendations may be updated based on patient status, additional functional criteria and insurance authorization.  Follow Up Recommendations  (TBD)      Assistance Recommended at Discharge Frequent or constant Supervision/Assistance  Patient can return home with the following  A little help  with walking and/or transfers;A little help with bathing/dressing/bathroom;Assistance with cooking/housework;Assist for transportation;Help with stairs or ramp for entrance    Equipment Recommendations None recommended by PT  Recommendations for Other Services       Functional Status Assessment Patient has had a recent decline in their functional status and demonstrates the ability to make significant improvements in function in a reasonable and predictable amount of time.     Precautions / Restrictions Precautions Precautions: Fall Precaution Comments: monitor sats, BP, dizzy on eval      Mobility  Bed Mobility               General bed mobility comments: in recliner    Transfers Overall transfer level: Needs assistance Equipment used: Rolling walker (2 wheels) Transfers: Sit to/from Stand Sit to Stand: Min assist           General transfer comment: support at RW    Ambulation/Gait Ambulation/Gait assistance: Min assist, +2 safety/equipment Gait Distance (Feet): 50 Feet Assistive device: Rolling walker (2 wheels) Gait Pattern/deviations: Step-through pattern Gait velocity: decr     General Gait Details: smoothe gait with RW, immediately after sitting down complained of dizziness.  Stairs            Wheelchair Mobility    Modified Rankin (Stroke Patients Only)       Balance Overall balance assessment: Mild deficits observed, not formally tested                                           Pertinent Vitals/Pain Pain Assessment Pain Assessment: No/denies pain  Home Living Family/patient expects to be discharged to:: Private residence Living Arrangements: Alone;Children Available Help at Discharge: Family;Available 24 hours/day Type of Home: Apartment Home Access: Level entry       Home Layout: One level Home Equipment: Conservation officer, nature (2 wheels)      Prior Function Prior Level of Function : Independent/Modified  Independent             Mobility Comments: indep in apartment, gets simple micro meals ADLs Comments: IADL's,does sponge bath     Hand Dominance   Dominant Hand: Right    Extremity/Trunk Assessment   Upper Extremity Assessment Upper Extremity Assessment: Overall WFL for tasks assessed    Lower Extremity Assessment Lower Extremity Assessment: Generalized weakness    Cervical / Trunk Assessment Cervical / Trunk Assessment: Kyphotic  Communication   Communication: No difficulties  Cognition Arousal/Alertness: Awake/alert Behavior During Therapy: WFL for tasks assessed/performed Overall Cognitive Status: Within Functional Limits for tasks assessed                                          General Comments      Exercises     Assessment/Plan    PT Assessment Patient needs continued PT services  PT Problem List Decreased strength;Decreased mobility;Decreased knowledge of precautions;Decreased activity tolerance;Decreased knowledge of use of DME;Cardiopulmonary status limiting activity       PT Treatment Interventions DME instruction;Therapeutic activities;Gait training;Therapeutic exercise;Patient/family education;Functional mobility training    PT Goals (Current goals can be found in the Care Plan section)  Acute Rehab PT Goals Patient Stated Goal: to go home, get strength back PT Goal Formulation: With patient/family Time For Goal Achievement: 09/15/22 Potential to Achieve Goals: Fair    Frequency Min 3X/week     Co-evaluation               AM-PAC PT "6 Clicks" Mobility  Outcome Measure Help needed turning from your back to your side while in a flat bed without using bedrails?: A Little Help needed moving from lying on your back to sitting on the side of a flat bed without using bedrails?: A Little Help needed moving to and from a bed to a chair (including a wheelchair)?: A Little Help needed standing up from a chair using your arms  (e.g., wheelchair or bedside chair)?: A Little Help needed to walk in hospital room?: A Little Help needed climbing 3-5 steps with a railing? : A Lot 6 Click Score: 17    End of Session Equipment Utilized During Treatment: Gait belt Activity Tolerance: Patient tolerated treatment well;Patient limited by fatigue Patient left: in chair;with call bell/phone within reach;with family/visitor present;with chair alarm set Nurse Communication: Mobility status PT Visit Diagnosis: Muscle weakness (generalized) (M62.81);Difficulty in walking, not elsewhere classified (R26.2)    Time: 9702-6378 PT Time Calculation (min) (ACUTE ONLY): 26 min   Charges:   PT Evaluation $PT Eval Low Complexity: 1 Low PT Treatments $Gait Training: 8-22 mins        [place] Office [phone] Weekend OINOM-[phone]    Claretha Cooper 09/01/2022, 3:01 PM

## 2022-09-01 NOTE — Progress Notes (Signed)
DAILY PROGRESS NOTE   Patient Name: [name] Date of Encounter: 09/01/2022 Cardiologist: Jenkins Rouge, MD  Chief Complaint   Weakness  Patient Profile   [name] is a 88 y.o. female with a hx of RBBB, varicose veins, history of seizure, liver cirrhosis s/p transplant at [facility] at age 3, prior history of EtOH abuse, history of mild AI and severe MR who is being seen 08/31/2022 for the evaluation of dyspnea and hypoxia at the request of Dr. Roel Cluck.  Subjective   Not much recorded output overnight. Weight 54.4 kg yesterday. BP in the 90's/low 100's today. Creatinine up and down, now 1.4. (GFR 36). BNP 1397.   New leukocytosis - now 12.8 (up from 3.9) - urine is nitrite positive with leukocytes and many bacteria, suggestive of UTI. Echo yesterday showed LVEF greater than 59%, grade 2 diastolic dysfunction, elevated left atrial pressure, severe left atrial enlargement, severe mitral regurgitation and mild to moderate tricuspid regurgitation.  Moderate aortic valve regurgitation.  Dilated IVC.  Objective   Vitals:   08/31/22 1323 08/31/22 1647 08/31/22 2052 09/01/22 0457  BP: (!) 106/48  (!) 102/49 (!) 103/55  Pulse: 88  84 84  Resp: 17  18   Temp: 97.9 F (36.6 C)  98.1 F (36.7 C) 97.9 F (36.6 C)  TempSrc: Oral  Oral Oral  SpO2: 97% 94% 97% 99%  Weight:    57.1 kg  Height:        Intake/Output Summary (Last 24 hours) at 09/01/2022 0851 Last data filed at 09/01/2022 0400 Gross per 24 hour  Intake 528.99 ml  Output 480 ml  Net 48.99 ml   Filed Weights   08/31/22 0838 09/01/22 0457  Weight: 54.4 kg 57.1 kg    Physical Exam   General appearance: alert, appears stated age, no distress, and thin Neck: JVD - several cm above sternal notch, no carotid bruit, and thyroid not enlarged, symmetric, no tenderness/mass/nodules Lungs: diminished breath sounds bibasilar Heart: regular rate and rhythm, S1, S2 normal, and systolic murmur: holosystolic 3/6, blowing at  apex Abdomen: soft, non-tender; bowel sounds normal; no masses,  no organomegaly Extremities: extremities normal, atraumatic, no cyanosis or edema Pulses: 2+ and symmetric Skin: pale, warm, dry Neurologic: Grossly normal Psych: Pleasant  Inpatient Medications    Scheduled Meds:  feeding supplement  237 mL Oral BID BM   furosemide  40 mg Intravenous Q12H   levETIRAcetam  250 mg Oral BID   magnesium oxide  400 mg Oral QHS   multivitamin with minerals  1 tablet Oral Daily   sodium chloride flush  3 mL Intravenous Q12H   tacrolimus  2 mg Oral Daily   tacrolimus  3 mg Oral QHS    Continuous Infusions:  sodium chloride     albumin human     cefTRIAXone (ROCEPHIN)  IV 2 g (09/01/22 0355)    PRN Meds: sodium chloride, acetaminophen **OR** acetaminophen, lip balm, morphine injection, sodium chloride flush   Labs   Results for orders placed or performed during the hospital encounter of 08/30/22 (from the past 48 hour(s))  CBC     Status: Abnormal   Collection Time: 08/30/22  4:43 PM  Result Value Ref Range   WBC 3.9 (L) 4.0 - 10.5 K/uL   RBC 4.16 3.87 - 5.11 MIL/uL   Hemoglobin 13.7 12.0 - 15.0 g/dL   HCT 44.5 36.0 - 46.0 %   MCV 107.0 (H) 80.0 - 100.0 fL   MCH 32.9  26.0 - 34.0 pg   MCHC 30.8 30.0 - 36.0 g/dL   RDW 15.5 11.5 - 15.5 %   Platelets 202 150 - 400 K/uL   nRBC 0.0 0.0 - 0.2 %    Comment: Performed at [facility], [place] [address], [place], [place] [address]  Comprehensive metabolic panel     Status: Abnormal   Collection Time: 08/30/22  4:43 PM  Result Value Ref Range   Sodium 134 (L) 135 - 145 mmol/L   Potassium 4.7 3.5 - 5.1 mmol/L   Chloride 98 98 - 111 mmol/L   CO2 30 22 - 32 mmol/L   Glucose, Bld 92 70 - 99 mg/dL    Comment: Glucose reference range applies only to samples taken after fasting for at least 8 hours.   BUN 33 (H) 8 - 23 mg/dL   Creatinine, Ser 1.24 (H) 0.44 - 1.00 mg/dL   Calcium 9.0 8.9 - 10.3 mg/dL   Total Protein 6.5  6.5 - 8.1 g/dL   Albumin 3.1 (L) 3.5 - 5.0 g/dL   AST 19 15 - 41 U/L   ALT 13 0 - 44 U/L   Alkaline Phosphatase 66 38 - 126 U/L   Total Bilirubin 1.2 0.3 - 1.2 mg/dL   GFR, Estimated 42 (L) >60 mL/min    Comment: (NOTE) Calculated using the CKD-EPI Creatinine Equation (2021)    Anion gap 6 5 - 15    Comment: Performed at [facility], [place] [address], [place], [place] [address]  Troponin I (High Sensitivity)     Status: Abnormal   Collection Time: 08/30/22  4:43 PM  Result Value Ref Range   Troponin I (High Sensitivity) 34 (H) <18 ng/L    Comment: (NOTE) Elevated high sensitivity troponin I (hsTnI) values and significant  changes across serial measurements may suggest ACS but many other  chronic and acute conditions are known to elevate hsTnI results.  Refer to the "Links" section for chest pain algorithms and additional  guidance. Performed at [facility], [place] [address], [place], [place] [address]   Brain natriuretic peptide     Status: Abnormal   Collection Time: 08/30/22  4:43 PM  Result Value Ref Range   B Natriuretic Peptide 1,397.1 (H) 0.0 - 100.0 pg/mL    Comment: Performed at [facility], [place] [address], [place], [place] [address]  SARS Coronavirus 2 by RT PCR (hospital order, performed in [facility] hospital lab) *cepheid single result test* Anterior Nasal Swab     Status: None   Collection Time: 08/30/22  4:49 PM   Specimen: Anterior Nasal Swab  Result Value Ref Range   SARS Coronavirus 2 by RT PCR NEGATIVE NEGATIVE    Comment: (NOTE) SARS-CoV-2 target nucleic acids are NOT DETECTED.  The SARS-CoV-2 RNA is generally detectable in upper and lower respiratory specimens during the acute phase of infection. The lowest concentration of SARS-CoV-2 viral copies this assay can detect is 250 copies / mL. A negative result does not preclude SARS-CoV-2 infection and should not be used as the sole basis for treatment  or other patient management decisions.  A negative result may occur with improper specimen collection / handling, submission of specimen other than nasopharyngeal swab, presence of viral mutation(s) within the areas targeted by this assay, and inadequate number of viral copies (<250 copies / mL). A negative result must be combined with clinical observations, patient history, and epidemiological information.  Fact Sheet for Patients:  https://www.patel.info/  Fact Sheet for Healthcare Providers: https://hall.com/  This test is not yet approved or  cleared by the [place] FDA and has been authorized for detection and/or diagnosis of SARS-CoV-2 by FDA under an Emergency Use Authorization (EUA).  This EUA will remain in effect (meaning this test can be used) for the duration of the COVID-19 declaration under Section 564(b)(1) of the Act, 21 U.S.C. section 360bbb-3(b)(1), unless the authorization is terminated or revoked sooner.  Performed at [facility], [place] [address] [place], [place] [address]   Troponin I (High Sensitivity)     Status: Abnormal   Collection Time: 08/30/22  6:13 PM  Result Value Ref Range   Troponin I (High Sensitivity) 31 (H) <18 ng/L    Comment: (NOTE) Elevated high sensitivity troponin I (hsTnI) values and significant  changes across serial measurements may suggest ACS but many other  chronic and acute conditions are known to elevate hsTnI results.  Refer to the "Links" section for chest pain algorithms and additional  guidance. Performed at [facility], [place] [address] [place], [place] [address]   Protime-INR     Status: None   Collection Time: 08/30/22  8:30 PM  Result Value Ref Range   Prothrombin Time 14.2 11.4 - 15.2 seconds   INR 1.1 0.8 - 1.2    Comment: (NOTE) INR goal varies based on device and disease states. Performed at [facility],  [place] [address] [place], [place] [address]   Blood gas, venous     Status: Abnormal   Collection Time: 08/30/22  8:30 PM  Result Value Ref Range   pH, Ven 7.34 7.25 - 7.43   pCO2, Ven 53 44 - 60 mmHg   pO2, Ven 42 32 - 45 mmHg   Bicarbonate 28.6 (H) 20.0 - 28.0 mmol/L   Acid-Base Excess 1.5 0.0 - 2.0 mmol/L   O2 Saturation 63.3 %   Patient temperature 37.0     Comment: Performed at [facility], [place] [address], [place], [place] [address]  Urinalysis, Routine w reflex microscopic Urine, Clean Catch     Status: Abnormal   Collection Time: 08/30/22 10:45 PM  Result Value Ref Range   Color, Urine YELLOW YELLOW   APPearance HAZY (A) CLEAR   Specific Gravity, Urine 1.019 1.005 - 1.030   pH 7.0 5.0 - 8.0   Glucose, UA NEGATIVE NEGATIVE mg/dL   Hgb urine dipstick LARGE (A) NEGATIVE   Bilirubin Urine NEGATIVE NEGATIVE   Ketones, ur NEGATIVE NEGATIVE mg/dL   Protein, ur NEGATIVE NEGATIVE mg/dL   Nitrite POSITIVE (A) NEGATIVE   Leukocytes,Ua LARGE (A) NEGATIVE   RBC / HPF 21-50 0 - 5 RBC/hpf   WBC, UA 11-20 0 - 5 WBC/hpf   Bacteria, UA MANY (A) NONE SEEN   Mucus PRESENT     Comment: Performed at [facility], [place] [address], [place],  [address]  Troponin I (High Sensitivity)     Status: Abnormal   Collection Time: 08/30/22 11:00 PM  Result Value Ref Range   Troponin I (High Sensitivity) 32 (H) <18 ng/L    Comment: (NOTE) Elevated high sensitivity troponin I (hsTnI) values and significant  changes across serial measurements may suggest ACS but many other  chronic and acute conditions are known to elevate hsTnI results.  Refer to the "Links" section for chest pain algorithms and additional  guidance. Performed at [facility], [place] [address] [place], [place]  [address]   Comprehensive metabolic panel     Status: Abnormal   Collection Time: 08/31/22  1:00 AM  Result Value Ref Range   Sodium 132 (L) 135 - 145 mmol/L    Potassium 4.5 3.5 - 5.1 mmol/L   Chloride 95 (L) 98 - 111 mmol/L   CO2 26 22 - 32 mmol/L   Glucose, Bld 171 (H) 70 - 99 mg/dL    Comment: Glucose reference range applies only to samples taken after fasting for at least 8 hours.   BUN 33 (H) 8 - 23 mg/dL   Creatinine, Ser 1.40 (H) 0.44 - 1.00 mg/dL   Calcium 8.6 (L) 8.9 - 10.3 mg/dL   Total Protein 7.0 6.5 - 8.1 g/dL   Albumin 3.3 (L) 3.5 - 5.0 g/dL   AST 22 15 - 41 U/L   ALT 13 0 - 44 U/L   Alkaline Phosphatase 71 38 - 126 U/L   Total Bilirubin 1.8 (H) 0.3 - 1.2 mg/dL   GFR, Estimated 36 (L) >60 mL/min    Comment: (NOTE) Calculated using the CKD-EPI Creatinine Equation (2021)    Anion gap 11 5 - 15    Comment: Performed at [facility], [place] [address], [place], [place] [address]  CBC     Status: Abnormal   Collection Time: 08/31/22  1:00 AM  Result Value Ref Range   WBC 12.8 (H) 4.0 - 10.5 K/uL   RBC 4.47 3.87 - 5.11 MIL/uL   Hemoglobin 14.9 12.0 - 15.0 g/dL   HCT 47.5 (H) 36.0 - 46.0 %   MCV 106.3 (H) 80.0 - 100.0 fL   MCH 33.3 26.0 - 34.0 pg   MCHC 31.4 30.0 - 36.0 g/dL   RDW 15.8 (H) 11.5 - 15.5 %   Platelets 246 150 - 400 K/uL   nRBC 0.2 0.0 - 0.2 %    Comment: Performed at [facility], [place] [address], [place], [place] [address]  Troponin I (High Sensitivity)     Status: Abnormal   Collection Time: 08/31/22  1:00 AM  Result Value Ref Range   Troponin I (High Sensitivity) 39 (H) <18 ng/L    Comment: (NOTE) Elevated high sensitivity troponin I (hsTnI) values and significant  changes across serial measurements may suggest ACS but many other  chronic and acute conditions are known to elevate hsTnI results.  Refer to the "Links" section for chest pain algorithms and additional  guidance. Performed at [facility], [place] [address], [place], [place] [address]   Magnesium     Status: None   Collection Time: 08/31/22  1:00 AM  Result Value Ref Range   Magnesium 2.0  1.7 - 2.4 mg/dL    Comment: Performed at [facility], [place] [address], [place], [place] [address]    ECG   N/A  Telemetry   Sinus rhythm with RBBB - Personally Reviewed  Radiology    ECHOCARDIOGRAM COMPLETE  Result Date: 08/31/2022    ECHOCARDIOGRAM REPORT   Patient Name:   ZERIYAH WAIN Date of Exam: 08/31/2022 Medical Rec #:  [id number]       Height:       63.0 in Accession #:    [id number]      Weight:       114.0 lb Date of Birth:  1932-08-30       BSA:          1.523 m Patient Age:    47 years  BP:           119/56 mmHg Patient Gender: F               HR:           89 bpm. Exam Location:  Inpatient Procedure: 2D Echo, Cardiac Doppler and Color Doppler Indications:    CHF  History:        Patient has prior history of Echocardiogram examinations, most                 recent 04/25/2022.  Sonographer:    Jefferey Pica Referring Phys: Eva  1. Left ventricular ejection fraction, by estimation, is >75%. The left ventricle has hyperdynamic function. The left ventricle has no regional wall motion abnormalities. Left ventricular diastolic parameters are consistent with Grade II diastolic dysfunction (pseudonormalization). Elevated left atrial pressure. There is the interventricular septum is flattened in systole and diastole, consistent with right ventricular pressure and volume overload.  2. Right ventricular systolic function is normal. The right ventricular size is normal. There is severely elevated pulmonary artery systolic pressure.  3. Left atrial size was severely dilated.  4. The mitral valve is normal in structure. Severe mitral valve regurgitation. No evidence of mitral stenosis. Moderate mitral annular calcification.  5. Tricuspid valve regurgitation is mild to moderate.  6. The aortic valve is tricuspid. Aortic valve regurgitation is moderate. Aortic valve sclerosis is present, with no evidence of aortic valve stenosis.  7. The  inferior vena cava is dilated in size with <50% respiratory variability, suggesting right atrial pressure of 15 mmHg. Comparison(s): No significant change from prior study. FINDINGS  Left Ventricle: Left ventricular ejection fraction, by estimation, is >75%. The left ventricle has hyperdynamic function. The left ventricle has no regional wall motion abnormalities. The left ventricular internal cavity size was normal in size. There is no left ventricular hypertrophy. The interventricular septum is flattened in systole and diastole, consistent with right ventricular pressure and volume overload. Left ventricular diastolic parameters are consistent with Grade II diastolic dysfunction  (pseudonormalization). Elevated left atrial pressure. Right Ventricle: The right ventricular size is normal. Right ventricular systolic function is normal. There is severely elevated pulmonary artery systolic pressure. The tricuspid regurgitant velocity is 3.37 m/s, and with an assumed right atrial pressure  of 15 mmHg, the estimated right ventricular systolic pressure is 44.0 mmHg. Left Atrium: Left atrial size was severely dilated. Right Atrium: Right atrial size was normal in size. Pericardium: Trivial pericardial effusion is present. Mitral Valve: The mitral valve is normal in structure. Moderate mitral annular calcification. Severe mitral valve regurgitation. No evidence of mitral valve stenosis. MV peak gradient, 18.0 mmHg. The mean mitral valve gradient is 9.0 mmHg. Tricuspid Valve: The tricuspid valve is normal in structure. Tricuspid valve regurgitation is mild to moderate. No evidence of tricuspid stenosis. Aortic Valve: The aortic valve is tricuspid. Aortic valve regurgitation is moderate. Aortic regurgitation PHT measures 432 msec. Aortic valve sclerosis is present, with no evidence of aortic valve stenosis. Aortic valve peak gradient measures 12.6 mmHg. Pulmonic Valve: The pulmonic valve was normal in structure. Pulmonic  valve regurgitation is trivial. No evidence of pulmonic stenosis. Aorta: The aortic root is normal in size and structure. Venous: The inferior vena cava is dilated in size with less than 50% respiratory variability, suggesting right atrial pressure of 15 mmHg. IAS/Shunts: No atrial level shunt detected by color flow Doppler. Additional Comments: There is a small pleural effusion in the left lateral region.  LEFT VENTRICLE PLAX 2D LVIDd:         4.60 cm   Diastology LVIDs:         2.85 cm   LV e' medial:    3.15 cm/s LV PW:         0.90 cm   LV E/e' medial:  54.9 LV IVS:        0.90 cm   LV e' lateral:   2.59 cm/s LVOT diam:     1.73 cm   LV E/e' lateral: 66.8 LV SV:         55 LV SV Index:   36 LVOT Area:     2.36 cm  RIGHT VENTRICLE             IVC RV Basal diam:  2.80 cm     IVC diam: 3.10 cm RV S prime:     14.00 cm/s TAPSE (M-mode): 2.1 cm LEFT ATRIUM              Index        RIGHT ATRIUM           Index LA diam:        4.70 cm  3.09 cm/m   RA Area:     15.20 cm LA Vol (A2C):   115.0 ml 75.52 ml/m  RA Volume:   33.10 ml  21.74 ml/m LA Vol (A4C):   106.0 ml 69.61 ml/m LA Biplane Vol: 112.0 ml 73.55 ml/m  AORTIC VALVE                 PULMONIC VALVE AV Area (Vmax): 1.85 cm     PV Vmax:       1.16 m/s AV Vmax:        177.50 cm/s  PV Peak grad:  5.4 mmHg AV Peak Grad:   12.6 mmHg LVOT Vmax:      139.00 cm/s LVOT Vmean:     90.100 cm/s LVOT VTI:       0.234 m AI PHT:         432 msec  AORTA Ao Root diam: 2.90 cm Ao Asc diam:  3.30 cm MITRAL VALVE                TRICUSPID VALVE MV Area (PHT): 2.07 cm     TR Peak grad:   45.4 mmHg MV Area VTI:   0.94 cm     TR Vmax:        337.00 cm/s MV Peak grad:  18.0 mmHg MV Mean grad:  9.0 mmHg     SHUNTS MV Vmax:       2.12 m/s     Systemic VTI:  0.23 m MV Vmean:      147.0 cm/s   Systemic Diam: 1.73 cm MV Decel Time: 366 msec MR Peak grad: 83.7 mmHg MR Vmax:      457.50 cm/s MV E velocity: 173.00 cm/s MV A velocity: 150.00 cm/s MV E/A ratio:  1.15 Kirk Ruths MD  Electronically signed by Kirk Ruths MD Signature Date/Time: 08/31/2022/12:48:18 PM    Final    CT ABDOMEN PELVIS W CONTRAST  Result Date: 08/30/2022 CLINICAL DATA:  Abdominal pain, acute, nonlocalized EXAM: CT ABDOMEN AND PELVIS WITH CONTRAST TECHNIQUE: Multidetector CT imaging of the abdomen and pelvis was performed using the standard protocol following bolus administration of intravenous contrast. RADIATION DOSE REDUCTION: This exam was performed according to the departmental dose-optimization program which includes automated exposure control, adjustment of the  mA and/or kV according to patient size and/or use of iterative reconstruction technique. CONTRAST:  163m OMNIPAQUE IOHEXOL 350 MG/ML SOLN COMPARISON:  11/13/2004 FINDINGS: Lower chest: See chest CT report Hepatobiliary: Heterogeneous enhancement throughout the liver without focal lesion. Prior cholecystectomy. Common bile duct is dilated measuring 14 mm, likely related to age and post cholecystectomy state. Pancreas: Atrophy.  No focal abnormality or ductal dilatation. Spleen: 11 mm low-density lesion in superior aspect of the spleen, likely small cyst or hemangioma. Normal size. Adrenals/Urinary Tract: No adrenal abnormality. No focal renal abnormality. No stones or hydronephrosis. Urinary bladder is unremarkable. Stomach/Bowel: Stomach, large and small bowel grossly unremarkable. Vascular/Lymphatic: Heavily calcified aorta and iliac vessels. No evidence of aneurysm or adenopathy. Reproductive: No visible focal abnormality. Other: No free fluid or free air. Musculoskeletal: No acute bony abnormality. Old healed left inferior pubic ramus fracture. IMPRESSION: No acute findings in the abdomen or pelvis. Aortoiliac atherosclerosis. Electronically Signed   By: KRolm BaptiseM.D.   On: 08/30/2022 17:25   CT Angio Chest PE W and/or Wo Contrast  Result Date: 08/30/2022 CLINICAL DATA:  Pulmonary embolism (PE) suspected, high prob EXAM: CT ANGIOGRAPHY  CHEST WITH CONTRAST TECHNIQUE: Multidetector CT imaging of the chest was performed using the standard protocol during bolus administration of intravenous contrast. Multiplanar CT image reconstructions and MIPs were obtained to evaluate the vascular anatomy. RADIATION DOSE REDUCTION: This exam was performed according to the departmental dose-optimization program which includes automated exposure control, adjustment of the mA and/or kV according to patient size and/or use of iterative reconstruction technique. CONTRAST:  1075mOMNIPAQUE IOHEXOL 350 MG/ML SOLN COMPARISON:  06/13/2006 FINDINGS: Cardiovascular: Cardiomegaly. Densely calcified mitral valve annulus, as well as coronary arteries and scattered aortic calcifications. No aneurysm. No filling defects in the pulmonary arteries to suggest pulmonary emboli. Mediastinum/Nodes: No mediastinal, hilar, or axillary adenopathy. Trachea and esophagus are unremarkable. Thyroid unremarkable. Lungs/Pleura: Small to moderate bilateral effusions, right greater than left. Bilateral ground-glass airspace opacities, likely edema/CHF. Upper Abdomen: No acute findings Musculoskeletal: Chest wall soft tissues are unremarkable. No acute bony abnormality. Review of the MIP images confirms the above findings. IMPRESSION: No evidence of pulmonary embolus. Cardiomegaly with bilateral airspace disease and effusions, likely CHF. Coronary artery disease. Aortic Atherosclerosis (ICD10-I70.0). Electronically Signed   By: KeRolm Baptise.D.   On: 08/30/2022 17:22   DG Chest 2 View  Result Date: 08/30/2022 CLINICAL DATA:  Fatigue, low oxygen EXAM: CHEST - 2 VIEW COMPARISON:  04/04/2018 FINDINGS: Cardiomegaly. Bilateral airspace opacities concerning for edema. No effusions or pneumothorax. No acute bony abnormality. Aortic atherosclerosis. Dense mitral valve annular calcifications. IMPRESSION: Cardiomegaly with bilateral airspace disease, likely edema/CHF. Electronically Signed   By: KeRolm Baptise.D.   On: 08/30/2022 17:16    Cardiac Studies   See echo above  Assessment   Principal Problem:   Acute on chronic diastolic CHF (congestive heart failure) (HCC) Active Problems:   Hepatic cirrhosis (HCC)   [facility]  Seizure disorder ([place]  Mitral valve insufficiency and aortic valve insufficiency   CKD (chronic kidney disease), stage III (HCC)   Elevated troponin   Acute respiratory failure with hypoxia (HCC)   UTI (urinary tract infection)   Acute on chronic congestive heart failure ([place]  Plan   It does not appear that she diuresed significantly overnight- nurse noted a wet pad with minimal output. On treatment for UTI - now has a leukocytosis, from previous leukopenia. GFR is low (36) - Sodium  is lower today - will need escalated diuresis. Increase lasix to 80 mg IV BID - starting this am (in addition to 40 mg dose administered around 5 am).   Time Spent Directly with Patient:  I have spent a total of 25 minutes with the patient reviewing hospital notes, telemetry, EKGs, labs and examining the patient as well as establishing an assessment and plan that was discussed personally with the patient.  > 50% of time was spent in direct patient care.  Length of Stay:  LOS: 2 days   Pixie Casino, MD, [facility], [place] Director of the [facility] Lipid [facility]  Cardiovascular Risk Reduction Clinic Diplomate of the American Board of Clinical Lipidology Attending Cardiologist  Direct Dial: [phone]  Fax: [phone]  Website:  [email or web address] 09/01/2022, 8:51 AM

## 2022-09-01 NOTE — Progress Notes (Signed)
Met with patient, her daughter and son this morning. We discussed her current condition, prognosis and hospice options. They are going to consider options and will follow-up tomorrow. Adjusted her pain medications- c/o upper back pain.  Lane Hacker, DO Palliative Medicine

## 2022-09-01 NOTE — Progress Notes (Signed)
PROGRESS NOTE    [name]  DVV:[id number] DOB: Oct 13, 1932 DOA: 08/30/2022 PCP: Marin Olp, MD   Chief Complaint  Patient presents with   Fatigue   Diarrhea    Brief Narrative:  Patient is a pleasant 88 year old female with history significant of diastolic CHF, seizure disorder on Keppra, history of cirrhosis status post liver transplant on Prograf, CKD stage III, degenerative mitral valve disease with severe MVR who was admitted with acute respiratory failure with hypoxia secondary to acute on chronic diastolic failure which was likely in the setting of worsening mitral valve disease, UTI placed on IV antibiotics.  Cardiology consulted and following.    Assessment & Plan:   Principal Problem:   Acute on chronic diastolic CHF (congestive heart failure) (HCC) Active Problems:   Hepatic cirrhosis (HCC)   Liver replaced by transplant ([place])   Seizure disorder ([place])   Mitral valve insufficiency and aortic valve insufficiency   CKD (chronic kidney disease), stage III (HCC)   Elevated troponin   Acute respiratory failure with hypoxia (HCC)   UTI (urinary tract infection)   Acute on chronic congestive heart failure ([place])  #1 acute respiratory failure with hypoxia secondary to acute on chronic diastolic CHF exacerbation likely secondary to worsening severe MVR. -Patient admitted with shortness of breath, generalized fatigue, noted to be hypoxic on room air. -BNP on admission noted to be 1397. -Chest x-ray with cardiomegaly with bilateral airspace disease, likely edema/CHF. -CT angiogram chest done negative for PE, however with cardiomegaly with bilateral airspace disease and effusions likely CHF, CAD, aortic atherosclerosis noted. -2D echo ordered and done with a EF > 62%,IRSW, grade 2 diastolic dysfunction, severely dilated left atrial size, severe MVR, mild to moderate TVR, moderate AVR. -Patient currently on 2 L nasal cannula with sats ranging from 92 to 97%. -Patient  was on Lasix 40 mg IV every 12 hours, however no significant urine output recorded/diuresis. -Patient seen in consultation by cardiology and feels patient's acute CHF likely secondary to worsening mitral valve disease, it is noted per cardiology that patient with no options for cardiac surgery.  Patient also noted not to be a good candidate for mitral valve clipping.  Cardiology recommending continuation of IV diuresis with palliative care consultation. -IV Lasix increased to 80 mg twice daily per cardiology. -Continue IV albumin. -Palliative care consulted. -Per cardiology. -Supportive care.  2.  Worsening severe MVR -Patient seen by cardiology felt not to be a candidate for surgery including MitraClip. -Repeat 2D echo done during this hospitalization with severe MVR, severely dilated left atrial size, mild to moderate TVR, moderate AVR, EF > 54%, grade 2 diastolic dysfunction. -Cardiology recommending palliative care consultation. -Cardiology following.  3.  UTI -Urine cultures preliminary with > 100,000 colonies of E. coli with sensitivities pending. -Continue IV Rocephin.  4.  Seizure disorder -No seizures noted.   -Continue home regimen Keppra 250 mg twice daily.  5.  CKD stage IIIb -Renal function fluctuating. -Monitor with diuresis. -Avoid nephrotoxic agents such as NSAIDs, vancomycin and Zosyn combo, avoid hypotension.  6.  Elevated troponin -Elevated but flat. -Like secondary to acute CHF exacerbation. -Cardiology following.  7.  History of liver cirrhosis status post transplant at [place] 15 years ago -Stable -Outpatient follow-up.   DVT prophylaxis: SCDs Code Status: DNR (discussed with daughter, POA, son, patient at bedside) Family Communication: Updated patient, son, daughter at bedside. Disposition: TBD  Status is: Inpatient Remains inpatient appropriate because: Required IV diuresis, and acute respiratory failure with hypoxia, severity  of illness.    Consultants:  Palliative care Christiana Fuchs, DO 08/31/2022 Cardiology: Dr.O'Neal 08/31/2022  Procedures:  2D echo 08/31/2022 CT angiogram chest 08/30/2022 CT abdomen and pelvis 08/30/2022 Chest x-ray 08/30/2022  Antimicrobials:  IV Rocephin 08/30/2022>>>>   Subjective: Laying in bed, daughter and son at bedside.  Still with fatigue.  No further nausea or emesis.  No chest pain.  States lower abdominal pain when palpated otherwise no pain at rest.  No significant change with shortness of breath.  Having multiple bowel movements.  Denies dysuria.    Objective: Vitals:   08/31/22 1323 08/31/22 1647 08/31/22 2052 09/01/22 0457  BP: (!) 106/48  (!) 102/49 (!) 103/55  Pulse: 88  84 84  Resp: 17  18   Temp: 97.9 F (36.6 C)  98.1 F (36.7 C) 97.9 F (36.6 C)  TempSrc: Oral  Oral Oral  SpO2: 97% 94% 97% 99%  Weight:    57.1 kg  Height:        Intake/Output Summary (Last 24 hours) at 09/01/2022 1048 Last data filed at 09/01/2022 0400 Gross per 24 hour  Intake 528.99 ml  Output 480 ml  Net 48.99 ml    Filed Weights   08/31/22 0838 09/01/22 0457  Weight: 54.4 kg 57.1 kg    Examination:  General exam: NAD Respiratory system: Scattered diffuse crackles.  No wheezing.  No rhonchi.  Fair air movement.  Speaking in full sentences.   Cardiovascular system: RRR no murmurs rubs or gallops.  No JVD.  No lower extremity edema.  Gastrointestinal system: Abdomen is soft, some tenderness to palpation in the suprapubic region, nondistended, positive bowel sounds.  No rebound.  No guarding.  Central nervous system: Alert and oriented. No focal neurological deficits. Extremities: Symmetric 5 x 5 power. Skin: No rashes, lesions or ulcers Psychiatry: Judgement and insight appear normal. Mood & affect appropriate.     Data Reviewed: I have personally reviewed following labs and imaging studies  CBC: Recent Labs  Lab 08/28/22 1614 08/30/22 1524 08/30/22 1643 08/31/22 0100 09/01/22 0828   WBC 5.2 3.9* 3.9* 12.8* 8.3  NEUTROABS 3.6  --   --   --  6.8  HGB 13.9 14.2 13.7 14.9 13.7  HCT 43.9 43.6 44.5 47.5* 44.6  MCV 105.3* 102.1* 107.0* 106.3* 107.7*  PLT 220.0 224.0 202 246 165     Basic Metabolic Panel: Recent Labs  Lab 08/28/22 1614 08/30/22 1524 08/30/22 1643 08/31/22 0100 09/01/22 0828  NA 139 133* 134* 132* 136  K 6.2* 4.5 4.7 4.5 5.0  CL 100 96 98 95* 97*  CO2 34* '31 30 26 29  '$ GLUCOSE 115* 94 92 171* 84  BUN 46* 33* 33* 33* 41*  CREATININE 1.57* 1.30* 1.24* 1.40* 2.01*  CALCIUM 10.6* 9.5 9.0 8.6* 8.1*  MG  --   --   --  2.0 2.1     GFR: Estimated Creatinine Clearance: 16.3 mL/min (A) (by C-G formula based on SCr of 2.01 mg/dL (H)).  Liver Function Tests: Recent Labs  Lab 08/28/22 1614 08/30/22 1643 08/31/22 0100 09/01/22 0828  AST '19 19 22 '$ 14*  ALT '12 13 13 11  '$ ALKPHOS 72 66 71 62  BILITOT 0.9 1.2 1.8* 0.7  PROT 7.4 6.5 7.0 6.2*  ALBUMIN 3.6 3.1* 3.3* 2.7*     CBG: No results for input(s): "GLUCAP" in the last 168 hours.   Recent Results (from the past 240 hour(s))  SARS Coronavirus 2 by RT PCR (hospital order, performed in  [facility] hospital lab) *cepheid single result test* Anterior Nasal Swab     Status: None   Collection Time: 08/30/22  4:49 PM   Specimen: Anterior Nasal Swab  Result Value Ref Range Status   SARS Coronavirus 2 by RT PCR NEGATIVE NEGATIVE Final    Comment: (NOTE) SARS-CoV-2 target nucleic acids are NOT DETECTED.  The SARS-CoV-2 RNA is generally detectable in upper and lower respiratory specimens during the acute phase of infection. The lowest concentration of SARS-CoV-2 viral copies this assay can detect is 250 copies / mL. A negative result does not preclude SARS-CoV-2 infection and should not be used as the sole basis for treatment or other patient management decisions.  A negative result may occur with improper specimen collection / handling, submission of specimen other than nasopharyngeal swab,  presence of viral mutation(s) within the areas targeted by this assay, and inadequate number of viral copies (<250 copies / mL). A negative result must be combined with clinical observations, patient history, and epidemiological information.  Fact Sheet for Patients:   https://www.patel.info/  Fact Sheet for Healthcare Providers: https://hall.com/  This test is not yet approved or  cleared by the [place] FDA and has been authorized for detection and/or diagnosis of SARS-CoV-2 by FDA under an Emergency Use Authorization (EUA).  This EUA will remain in effect (meaning this test can be used) for the duration of the COVID-19 declaration under Section 564(b)(1) of the Act, 21 U.S.C. section 360bbb-3(b)(1), unless the authorization is terminated or revoked sooner.  Performed at [facility], [place] [address], [place], [place] [address]   Urine Culture     Status: Abnormal (Preliminary result)   Collection Time: 08/30/22 10:45 PM   Specimen: Urine, Clean Catch  Result Value Ref Range Status   Specimen Description   Final    URINE, CLEAN CATCH Performed at [facility], [place] [address] [place], [place] [address]    Special Requests   Final    NONE Performed at [facility], [place] [address] [place], [place] [address]    Culture >=100,000 COLONIES/mL ESCHERICHIA COLI (A)  Final   Report Status PENDING  Incomplete         Radiology Studies: ECHOCARDIOGRAM COMPLETE  Result Date: 08/31/2022    ECHOCARDIOGRAM REPORT   Patient Name:   ATIYA YERA Date of Exam: 08/31/2022 Medical Rec #:  [id number]       Height:       63.0 in Accession #:    [id number]      Weight:       114.0 lb Date of Birth:  10-25-1932       BSA:          1.523 m Patient Age:    67 years        BP:           119/56 mmHg Patient Gender: F               HR:           89 bpm. Exam Location:  Inpatient Procedure: 2D  Echo, Cardiac Doppler and Color Doppler Indications:    CHF  History:        Patient has prior history of Echocardiogram examinations, most                 recent 04/25/2022.  Sonographer:    Jefferey Pica Referring Phys: [place]  1. Left ventricular ejection fraction,  by estimation, is >75%. The left ventricle has hyperdynamic function. The left ventricle has no regional wall motion abnormalities. Left ventricular diastolic parameters are consistent with Grade II diastolic dysfunction (pseudonormalization). Elevated left atrial pressure. There is the interventricular septum is flattened in systole and diastole, consistent with right ventricular pressure and volume overload.  2. Right ventricular systolic function is normal. The right ventricular size is normal. There is severely elevated pulmonary artery systolic pressure.  3. Left atrial size was severely dilated.  4. The mitral valve is normal in structure. Severe mitral valve regurgitation. No evidence of mitral stenosis. Moderate mitral annular calcification.  5. Tricuspid valve regurgitation is mild to moderate.  6. The aortic valve is tricuspid. Aortic valve regurgitation is moderate. Aortic valve sclerosis is present, with no evidence of aortic valve stenosis.  7. The inferior vena cava is dilated in size with <50% respiratory variability, suggesting right atrial pressure of 15 mmHg. Comparison(s): No significant change from prior study. FINDINGS  Left Ventricle: Left ventricular ejection fraction, by estimation, is >75%. The left ventricle has hyperdynamic function. The left ventricle has no regional wall motion abnormalities. The left ventricular internal cavity size was normal in size. There is no left ventricular hypertrophy. The interventricular septum is flattened in systole and diastole, consistent with right ventricular pressure and volume overload. Left ventricular diastolic parameters are consistent with Grade II  diastolic dysfunction  (pseudonormalization). Elevated left atrial pressure. Right Ventricle: The right ventricular size is normal. Right ventricular systolic function is normal. There is severely elevated pulmonary artery systolic pressure. The tricuspid regurgitant velocity is 3.37 m/s, and with an assumed right atrial pressure  of 15 mmHg, the estimated right ventricular systolic pressure is 70.0 mmHg. Left Atrium: Left atrial size was severely dilated. Right Atrium: Right atrial size was normal in size. Pericardium: Trivial pericardial effusion is present. Mitral Valve: The mitral valve is normal in structure. Moderate mitral annular calcification. Severe mitral valve regurgitation. No evidence of mitral valve stenosis. MV peak gradient, 18.0 mmHg. The mean mitral valve gradient is 9.0 mmHg. Tricuspid Valve: The tricuspid valve is normal in structure. Tricuspid valve regurgitation is mild to moderate. No evidence of tricuspid stenosis. Aortic Valve: The aortic valve is tricuspid. Aortic valve regurgitation is moderate. Aortic regurgitation PHT measures 432 msec. Aortic valve sclerosis is present, with no evidence of aortic valve stenosis. Aortic valve peak gradient measures 12.6 mmHg. Pulmonic Valve: The pulmonic valve was normal in structure. Pulmonic valve regurgitation is trivial. No evidence of pulmonic stenosis. Aorta: The aortic root is normal in size and structure. Venous: The inferior vena cava is dilated in size with less than 50% respiratory variability, suggesting right atrial pressure of 15 mmHg. IAS/Shunts: No atrial level shunt detected by color flow Doppler. Additional Comments: There is a small pleural effusion in the left lateral region.  LEFT VENTRICLE PLAX 2D LVIDd:         4.60 cm   Diastology LVIDs:         2.85 cm   LV e' medial:    3.15 cm/s LV PW:         0.90 cm   LV E/e' medial:  54.9 LV IVS:        0.90 cm   LV e' lateral:   2.59 cm/s LVOT diam:     1.73 cm   LV E/e' lateral: 66.8 LV  SV:         55 LV SV Index:   36 LVOT Area:  2.36 cm  RIGHT VENTRICLE             IVC RV Basal diam:  2.80 cm     IVC diam: 3.10 cm RV S prime:     14.00 cm/s TAPSE (M-mode): 2.1 cm LEFT ATRIUM              Index        RIGHT ATRIUM           Index LA diam:        4.70 cm  3.09 cm/m   RA Area:     15.20 cm LA Vol (A2C):   115.0 ml 75.52 ml/m  RA Volume:   33.10 ml  21.74 ml/m LA Vol (A4C):   106.0 ml 69.61 ml/m LA Biplane Vol: 112.0 ml 73.55 ml/m  AORTIC VALVE                 PULMONIC VALVE AV Area (Vmax): 1.85 cm     PV Vmax:       1.16 m/s AV Vmax:        177.50 cm/s  PV Peak grad:  5.4 mmHg AV Peak Grad:   12.6 mmHg LVOT Vmax:      139.00 cm/s LVOT Vmean:     90.100 cm/s LVOT VTI:       0.234 m AI PHT:         432 msec  AORTA Ao Root diam: 2.90 cm Ao Asc diam:  3.30 cm MITRAL VALVE                TRICUSPID VALVE MV Area (PHT): 2.07 cm     TR Peak grad:   45.4 mmHg MV Area VTI:   0.94 cm     TR Vmax:        337.00 cm/s MV Peak grad:  18.0 mmHg MV Mean grad:  9.0 mmHg     SHUNTS MV Vmax:       2.12 m/s     Systemic VTI:  0.23 m MV Vmean:      147.0 cm/s   Systemic Diam: 1.73 cm MV Decel Time: 366 msec MR Peak grad: 83.7 mmHg MR Vmax:      457.50 cm/s MV E velocity: 173.00 cm/s MV A velocity: 150.00 cm/s MV E/A ratio:  1.15 Kirk Ruths MD Electronically signed by Kirk Ruths MD Signature Date/Time: 08/31/2022/12:48:18 PM    Final    CT ABDOMEN PELVIS W CONTRAST  Result Date: 08/30/2022 CLINICAL DATA:  Abdominal pain, acute, nonlocalized EXAM: CT ABDOMEN AND PELVIS WITH CONTRAST TECHNIQUE: Multidetector CT imaging of the abdomen and pelvis was performed using the standard protocol following bolus administration of intravenous contrast. RADIATION DOSE REDUCTION: This exam was performed according to the departmental dose-optimization program which includes automated exposure control, adjustment of the mA and/or kV according to patient size and/or use of iterative reconstruction technique.  CONTRAST:  136m OMNIPAQUE IOHEXOL 350 MG/ML SOLN COMPARISON:  11/13/2004 FINDINGS: Lower chest: See chest CT report Hepatobiliary: Heterogeneous enhancement throughout the liver without focal lesion. Prior cholecystectomy. Common bile duct is dilated measuring 14 mm, likely related to age and post cholecystectomy state. Pancreas: Atrophy.  No focal abnormality or ductal dilatation. Spleen: 11 mm low-density lesion in superior aspect of the spleen, likely small cyst or hemangioma. Normal size. Adrenals/Urinary Tract: No adrenal abnormality. No focal renal abnormality. No stones or hydronephrosis. Urinary bladder is unremarkable. Stomach/Bowel: Stomach, large and small bowel grossly unremarkable. Vascular/Lymphatic: Heavily calcified aorta and  iliac vessels. No evidence of aneurysm or adenopathy. Reproductive: No visible focal abnormality. Other: No free fluid or free air. Musculoskeletal: No acute bony abnormality. Old healed left inferior pubic ramus fracture. IMPRESSION: No acute findings in the abdomen or pelvis. Aortoiliac atherosclerosis. Electronically Signed   By: Rolm Baptise M.D.   On: 08/30/2022 17:25   CT Angio Chest PE W and/or Wo Contrast  Result Date: 08/30/2022 CLINICAL DATA:  Pulmonary embolism (PE) suspected, high prob EXAM: CT ANGIOGRAPHY CHEST WITH CONTRAST TECHNIQUE: Multidetector CT imaging of the chest was performed using the standard protocol during bolus administration of intravenous contrast. Multiplanar CT image reconstructions and MIPs were obtained to evaluate the vascular anatomy. RADIATION DOSE REDUCTION: This exam was performed according to the departmental dose-optimization program which includes automated exposure control, adjustment of the mA and/or kV according to patient size and/or use of iterative reconstruction technique. CONTRAST:  156m OMNIPAQUE IOHEXOL 350 MG/ML SOLN COMPARISON:  06/13/2006 FINDINGS: Cardiovascular: Cardiomegaly. Densely calcified mitral valve annulus,  as well as coronary arteries and scattered aortic calcifications. No aneurysm. No filling defects in the pulmonary arteries to suggest pulmonary emboli. Mediastinum/Nodes: No mediastinal, hilar, or axillary adenopathy. Trachea and esophagus are unremarkable. Thyroid unremarkable. Lungs/Pleura: Small to moderate bilateral effusions, right greater than left. Bilateral ground-glass airspace opacities, likely edema/CHF. Upper Abdomen: No acute findings Musculoskeletal: Chest wall soft tissues are unremarkable. No acute bony abnormality. Review of the MIP images confirms the above findings. IMPRESSION: No evidence of pulmonary embolus. Cardiomegaly with bilateral airspace disease and effusions, likely CHF. Coronary artery disease. Aortic Atherosclerosis (ICD10-I70.0). Electronically Signed   By: KRolm BaptiseM.D.   On: 08/30/2022 17:22   DG Chest 2 View  Result Date: 08/30/2022 CLINICAL DATA:  Fatigue, low oxygen EXAM: CHEST - 2 VIEW COMPARISON:  04/04/2018 FINDINGS: Cardiomegaly. Bilateral airspace opacities concerning for edema. No effusions or pneumothorax. No acute bony abnormality. Aortic atherosclerosis. Dense mitral valve annular calcifications. IMPRESSION: Cardiomegaly with bilateral airspace disease, likely edema/CHF. Electronically Signed   By: KRolm BaptiseM.D.   On: 08/30/2022 17:16        Scheduled Meds:  feeding supplement  237 mL Oral BID BM   furosemide  80 mg Intravenous BID   levETIRAcetam  250 mg Oral BID   magnesium oxide  400 mg Oral QHS   multivitamin with minerals  1 tablet Oral Daily   sodium chloride flush  3 mL Intravenous Q12H   tacrolimus  2 mg Oral Daily   tacrolimus  3 mg Oral QHS   Continuous Infusions:  sodium chloride     albumin human     cefTRIAXone (ROCEPHIN)  IV 2 g (09/01/22 0355)     LOS: 2 days    Time spent: 40 minutes    DIrine Seal MD [facility]   To contact the attending provider between 7A-7P or the covering provider during  after hours 7P-7A, please log into the web site [email or web address] and access using universal [place] password for that web site. If you do not have the password, please call the hospital operator.  09/01/2022, 10:48 AM

## 2022-09-01 NOTE — Progress Notes (Signed)
No need of bipap at this time.  

## 2022-09-02 DIAGNOSIS — N1832 Chronic kidney disease, stage 3b: Secondary | ICD-10-CM | POA: Diagnosis not present

## 2022-09-02 DIAGNOSIS — I5033 Acute on chronic diastolic (congestive) heart failure: Secondary | ICD-10-CM | POA: Diagnosis not present

## 2022-09-02 DIAGNOSIS — N390 Urinary tract infection, site not specified: Secondary | ICD-10-CM | POA: Diagnosis present

## 2022-09-02 DIAGNOSIS — R7989 Other specified abnormal findings of blood chemistry: Secondary | ICD-10-CM | POA: Diagnosis not present

## 2022-09-02 DIAGNOSIS — J9601 Acute respiratory failure with hypoxia: Secondary | ICD-10-CM | POA: Diagnosis not present

## 2022-09-02 DIAGNOSIS — B9620 Unspecified Escherichia coli [E. coli] as the cause of diseases classified elsewhere: Secondary | ICD-10-CM

## 2022-09-02 LAB — CBC WITH DIFFERENTIAL/PLATELET
Abs Immature Granulocytes: 0.05 10*3/uL (ref 0.00–0.07)
Basophils Absolute: 0 10*3/uL (ref 0.0–0.1)
Basophils Relative: 1 %
Eosinophils Absolute: 0.1 10*3/uL (ref 0.0–0.5)
Eosinophils Relative: 1 %
HCT: 43.8 % (ref 36.0–46.0)
Hemoglobin: 13.5 g/dL (ref 12.0–15.0)
Immature Granulocytes: 1 %
Lymphocytes Relative: 7 %
Lymphs Abs: 0.5 10*3/uL — ABNORMAL LOW (ref 0.7–4.0)
MCH: 32.8 pg (ref 26.0–34.0)
MCHC: 30.8 g/dL (ref 30.0–36.0)
MCV: 106.3 fL — ABNORMAL HIGH (ref 80.0–100.0)
Monocytes Absolute: 0.4 10*3/uL (ref 0.1–1.0)
Monocytes Relative: 6 %
Neutro Abs: 6.5 10*3/uL (ref 1.7–7.7)
Neutrophils Relative %: 84 %
Platelets: 173 10*3/uL (ref 150–400)
RBC: 4.12 MIL/uL (ref 3.87–5.11)
RDW: 14.9 % (ref 11.5–15.5)
WBC: 7.6 10*3/uL (ref 4.0–10.5)
nRBC: 0 % (ref 0.0–0.2)

## 2022-09-02 LAB — URINE CULTURE: Culture: >=100000 — AB

## 2022-09-02 LAB — BASIC METABOLIC PANEL
Anion gap: 11 (ref 5–15)
BUN: 55 mg/dL — ABNORMAL HIGH (ref 8–23)
CO2: 27 mmol/L (ref 22–32)
Calcium: 7.9 mg/dL — ABNORMAL LOW (ref 8.9–10.3)
Chloride: 94 mmol/L — ABNORMAL LOW (ref 98–111)
Creatinine, Ser: 2.19 mg/dL — ABNORMAL HIGH (ref 0.44–1.00)
GFR, Estimated: 21 mL/min — ABNORMAL LOW (ref >60)
Glucose, Bld: 120 mg/dL — ABNORMAL HIGH (ref 70–99)
Potassium: 4.5 mmol/L (ref 3.5–5.1)
Sodium: 132 mmol/L — ABNORMAL LOW (ref 135–145)

## 2022-09-02 LAB — MAGNESIUM: Magnesium: 2 mg/dL (ref 1.7–2.4)

## 2022-09-02 LAB — GLUCOSE, CAPILLARY: Glucose-Capillary: 171 mg/dL — ABNORMAL HIGH (ref 70–99)

## 2022-09-02 MED ORDER — HYDROMORPHONE HCL 1 MG/ML IJ SOLN
0.1000 mg | INTRAMUSCULAR | Status: DC
  Administered 2022-09-02 – 2022-09-04 (×7): 0.1 mg via INTRAVENOUS
  Filled 2022-09-02 (×8): qty 0.5

## 2022-09-02 MED ORDER — DIAZEPAM 5 MG/ML IJ SOLN
2.5000 mg | Freq: Three times a day (TID) | INTRAMUSCULAR | Status: DC
  Administered 2022-09-02 – 2022-09-04 (×7): 2.5 mg via INTRAVENOUS
  Filled 2022-09-02 (×8): qty 2

## 2022-09-02 MED ORDER — MELATONIN 3 MG PO TABS
6.0000 mg | ORAL_TABLET | Freq: Once | ORAL | Status: AC
  Administered 2022-09-02: 6 mg via ORAL
  Filled 2022-09-02: qty 2

## 2022-09-02 MED ORDER — MUSCLE RUB 10-15 % EX CREA
TOPICAL_CREAM | CUTANEOUS | Status: DC | PRN
  Filled 2022-09-02: qty 85

## 2022-09-02 MED ORDER — SENNOSIDES-DOCUSATE SODIUM 8.6-50 MG PO TABS
1.0000 | ORAL_TABLET | Freq: Two times a day (BID) | ORAL | Status: DC
  Filled 2022-09-02 (×5): qty 1

## 2022-09-02 MED ORDER — FUROSEMIDE 10 MG/ML IJ SOLN
80.0000 mg | Freq: Once | INTRAMUSCULAR | Status: AC
  Administered 2022-09-02: 80 mg via INTRAVENOUS
  Filled 2022-09-02: qty 8

## 2022-09-02 NOTE — Progress Notes (Addendum)
PROGRESS NOTE    [name]  UXN:[id number] DOB: 11/09/1932 DOA: 08/30/2022 PCP: Marin Olp, MD   Chief Complaint  Patient presents with   Fatigue   Diarrhea    Brief Narrative:  Patient is a pleasant 88 year old female with history significant of diastolic CHF, seizure disorder on Keppra, history of cirrhosis status post liver transplant on Prograf, CKD stage III, degenerative mitral valve disease with severe MVR who was admitted with acute respiratory failure with hypoxia secondary to acute on chronic diastolic failure which was likely in the setting of worsening mitral valve disease, UTI placed on IV antibiotics.  Cardiology consulted and following.    Assessment & Plan:   Principal Problem:   Acute on chronic diastolic CHF (congestive heart failure) (HCC) Active Problems:   Hepatic cirrhosis (HCC)   Liver replaced by transplant ([place])   Seizure disorder ([place])   Mitral valve insufficiency and aortic valve insufficiency   CKD (chronic kidney disease), stage III (HCC)   Elevated troponin   Acute respiratory failure with hypoxia (HCC)   UTI (urinary tract infection)   Acute on chronic congestive heart failure (HCC)   E. coli UTI  #1 acute respiratory failure with hypoxia secondary to acute on chronic diastolic CHF exacerbation likely secondary to worsening severe MVR. -Patient admitted with shortness of breath, generalized fatigue, noted to be hypoxic on room air. -BNP on admission noted to be 1397. -Chest x-ray with cardiomegaly with bilateral airspace disease, likely edema/CHF. -CT angiogram chest done negative for PE, however with cardiomegaly with bilateral airspace disease and effusions likely CHF, CAD, aortic atherosclerosis noted. -2D echo ordered and done with a EF > 25%,[facility], grade 2 diastolic dysfunction, severely dilated left atrial size, severe MVR, mild to moderate TVR, moderate AVR. -Patient currently on 2 L nasal cannula with sats ranging from 92 to  97%. -Patient was on Lasix 40 mg IV every 12 hours, however no significant urine output recorded/diuresis. -Patient seen in consultation by cardiology and feels patient's acute CHF likely secondary to worsening mitral valve disease, it is noted per cardiology that patient with no options for cardiac surgery.  Patient also noted not to be a good candidate for mitral valve clipping.  Cardiology recommending continuation of IV diuresis with palliative care consultation. -IV Lasix increased to 80 mg twice daily per cardiology on 09/01/2022. -Due to creatinine creeping up cardiology recommending holding diuresis today. -Labs for today pending. -Status post IV albumin. -Cardiology following and appreciate input and recommendations. -Palliative care following..  2.  Worsening severe MVR -Patient seen by cardiology felt not to be a candidate for surgery including MitraClip. -Repeat 2D echo done during this hospitalization with severe MVR, severely dilated left atrial size, mild to moderate TVR, moderate AVR, EF > 62%, grade 2 diastolic dysfunction. -Cardiology recommending palliative care consultation. -Cardiology following. -Palliative care following.  3.  E, Coli UTI -Urine cultures preliminary with > 100,000 colonies of E. coli which is pansensitive.   -Continue IV Rocephin through today and transition to oral antibiotics tomorrow to complete a 5-day course of treatment.   4.  Seizure disorder -No seizures noted.   -Continue home regimen Keppra 250 mg twice daily.  5.  CKD stage IIIb -Renal function fluctuating. -Creatinine slowly trending up. -Labs pending for today. -Per cardiology hold diuresis for today. -Avoid nephrotoxic agents such as NSAIDs, vancomycin and Zosyn combo, avoid hypotension.  6.  Elevated troponin -Elevated but flat. -Like secondary to acute CHF exacerbation. -Cardiology following.  7.  History of liver cirrhosis status post transplant at [facility] 15 years  ago -Stable -Outpatient follow-up.   DVT prophylaxis: SCDs Code Status: DNR (discussed with daughter, POA, son, patient at bedside) Family Communication: Updated patient, daughter at bedside. Disposition: TBD  Status is: Inpatient Remains inpatient appropriate because: Required IV diuresis, and acute respiratory failure with hypoxia, severity of illness.   Consultants:  Palliative care Christiana Fuchs, DO 08/31/2022 Cardiology: Dr.O'Neal 08/31/2022  Procedures:  2D echo 08/31/2022 CT angiogram chest 08/30/2022 CT abdomen and pelvis 08/30/2022 Chest x-ray 08/30/2022  Antimicrobials:  IV Rocephin 08/30/2022>>>>   Subjective: Sitting up in chair.  Still with fatigue.  Nausea and emesis with some improvement.  Denies any chest pain.  Still with some shortness of breath.  No abdominal pain improved she states when sitting up.  Tolerated a little bit of a pancake this morning.  Daughter at bedside.    Objective: Vitals:   09/01/22 1426 09/01/22 2032 09/02/22 0447 09/02/22 0500  BP: 117/61 (!) 116/59 (!) 124/55   Pulse: 88 92 88   Resp: '16 16 19   '$ Temp: 97.8 F (36.6 C) 97.9 F (36.6 C) 97.9 F (36.6 C)   TempSrc: Oral Oral Oral   SpO2: 98% 97% 93%   Weight:    57 kg  Height:        Intake/Output Summary (Last 24 hours) at 09/02/2022 1054 Last data filed at 09/02/2022 0800 Gross per 24 hour  Intake 417.7 ml  Output 600 ml  Net -182.3 ml   Filed Weights   08/31/22 0838 09/01/22 0457 09/02/22 0500  Weight: 54.4 kg 57.1 kg 57 kg    Examination:  General exam: NAD Respiratory system: Scattered crackles in the bases.  No wheezing.  No rhonchi.  Fair air movement.  Speaking in full sentences.   Cardiovascular system: Regular rate rhythm no murmurs rubs or gallops.  No JVD.  No significant lower extremity edema.  Gastrointestinal system: Abdomen is soft, decreased tenderness to palpation in suprapubic region, nondistended, positive bowel sounds.  No rebound.  No guarding.    Central nervous system: Alert and oriented. No focal neurological deficits. Extremities: Symmetric 5 x 5 power. Skin: No rashes, lesions or ulcers Psychiatry: Judgement and insight appear normal. Mood & affect appropriate.     Data Reviewed: I have personally reviewed following labs and imaging studies  CBC: Recent Labs  Lab 08/28/22 1614 08/30/22 1524 08/30/22 1643 08/31/22 0100 09/01/22 0828  WBC 5.2 3.9* 3.9* 12.8* 8.3  NEUTROABS 3.6  --   --   --  6.8  HGB 13.9 14.2 13.7 14.9 13.7  HCT 43.9 43.6 44.5 47.5* 44.6  MCV 105.3* 102.1* 107.0* 106.3* 107.7*  PLT 220.0 224.0 202 246 017    Basic Metabolic Panel: Recent Labs  Lab 08/28/22 1614 08/30/22 1524 08/30/22 1643 08/31/22 0100 09/01/22 0828  NA 139 133* 134* 132* 136  K 6.2* 4.5 4.7 4.5 5.0  CL 100 96 98 95* 97*  CO2 34* '31 30 26 29  '$ GLUCOSE 115* 94 92 171* 84  BUN 46* 33* 33* 33* 41*  CREATININE 1.57* 1.30* 1.24* 1.40* 2.01*  CALCIUM 10.6* 9.5 9.0 8.6* 8.1*  MG  --   --   --  2.0 2.1    GFR: Estimated Creatinine Clearance: 16.3 mL/min (A) (by C-G formula based on SCr of 2.01 mg/dL (H)).  Liver Function Tests: Recent Labs  Lab 08/28/22 1614 08/30/22 1643 08/31/22 0100 09/01/22 0828  AST '19 19 22 '$ 14*  ALT '12 13 13 11  '$ ALKPHOS 72 66 71 62  BILITOT 0.9 1.2 1.8* 0.7  PROT 7.4 6.5 7.0 6.2*  ALBUMIN 3.6 3.1* 3.3* 2.7*    CBG: No results for input(s): "GLUCAP" in the last 168 hours.   Recent Results (from the past 240 hour(s))  SARS Coronavirus 2 by RT PCR (hospital order, performed in [facility] hospital lab) *cepheid single result test* Anterior Nasal Swab     Status: None   Collection Time: 08/30/22  4:49 PM   Specimen: Anterior Nasal Swab  Result Value Ref Range Status   SARS Coronavirus 2 by RT PCR NEGATIVE NEGATIVE Final    Comment: (NOTE) SARS-CoV-2 target nucleic acids are NOT DETECTED.  The SARS-CoV-2 RNA is generally detectable in upper and lower respiratory specimens during the  acute phase of infection. The lowest concentration of SARS-CoV-2 viral copies this assay can detect is 250 copies / mL. A negative result does not preclude SARS-CoV-2 infection and should not be used as the sole basis for treatment or other patient management decisions.  A negative result may occur with improper specimen collection / handling, submission of specimen other than nasopharyngeal swab, presence of viral mutation(s) within the areas targeted by this assay, and inadequate number of viral copies (<250 copies / mL). A negative result must be combined with clinical observations, patient history, and epidemiological information.  Fact Sheet for Patients:   https://www.patel.info/  Fact Sheet for Healthcare Providers: https://hall.com/  This test is not yet approved or  cleared by the [place] FDA and has been authorized for detection and/or diagnosis of SARS-CoV-2 by FDA under an Emergency Use Authorization (EUA).  This EUA will remain in effect (meaning this test can be used) for the duration of the COVID-19 declaration under Section 564(b)(1) of the Act, 21 U.S.C. section 360bbb-3(b)(1), unless the authorization is terminated or revoked sooner.  Performed at [facility], [place] [address] [place], [place] [address]   Urine Culture     Status: Abnormal   Collection Time: 08/30/22 10:45 PM   Specimen: Urine, Clean Catch  Result Value Ref Range Status   Specimen Description   Final    URINE, CLEAN CATCH Performed at [facility], [place] [address], [place], [place] [address]    Special Requests   Final    NONE Performed at [facility], [place] [address] [place], [place] [address]    Culture >=100,000 COLONIES/mL ESCHERICHIA COLI (A)  Final   Report Status 09/02/2022 FINAL  Final   Organism ID, Bacteria ESCHERICHIA COLI (A)  Final      Susceptibility   Escherichia coli -  MIC*    AMPICILLIN <=2 SENSITIVE Sensitive     CEFAZOLIN <=4 SENSITIVE Sensitive     CEFEPIME <=0.12 SENSITIVE Sensitive     CEFTRIAXONE <=0.25 SENSITIVE Sensitive     CIPROFLOXACIN <=0.25 SENSITIVE Sensitive     GENTAMICIN <=1 SENSITIVE Sensitive     IMIPENEM <=0.25 SENSITIVE Sensitive     NITROFURANTOIN <=16 SENSITIVE Sensitive     TRIMETH/SULFA <=20 SENSITIVE Sensitive     AMPICILLIN/SULBACTAM <=2 SENSITIVE Sensitive     PIP/TAZO <=4 SENSITIVE Sensitive     * >=100,000 COLONIES/mL ESCHERICHIA COLI         Radiology Studies: ECHOCARDIOGRAM COMPLETE  Result Date: 08/31/2022    ECHOCARDIOGRAM REPORT   Patient Name:   [name] Date of Exam: 08/31/2022 Medical Rec #:  [id number]  Height:       63.0 in Accession #:    [id number]      Weight:       114.0 lb Date of Birth:  August 16, 1932       BSA:          1.523 m Patient Age:    71 years        BP:           119/56 mmHg Patient Gender: F               HR:           89 bpm. Exam Location:  Inpatient Procedure: 2D Echo, Cardiac Doppler and Color Doppler Indications:    CHF  History:        Patient has prior history of Echocardiogram examinations, most                 recent 04/25/2022.  Sonographer:    Jefferey Pica Referring Phys: [place] [facility]  1. Left ventricular ejection fraction, by estimation, is >75%. The left ventricle has hyperdynamic function. The left ventricle has no regional wall motion abnormalities. Left ventricular diastolic parameters are consistent with Grade II diastolic dysfunction (pseudonormalization). Elevated left atrial pressure. There is the interventricular septum is flattened in systole and diastole, consistent with right ventricular pressure and volume overload.  2. Right ventricular systolic function is normal. The right ventricular size is normal. There is severely elevated pulmonary artery systolic pressure.  3. Left atrial size was severely dilated.  4. The mitral valve is normal in  structure. Severe mitral valve regurgitation. No evidence of mitral stenosis. Moderate mitral annular calcification.  5. Tricuspid valve regurgitation is mild to moderate.  6. The aortic valve is tricuspid. Aortic valve regurgitation is moderate. Aortic valve sclerosis is present, with no evidence of aortic valve stenosis.  7. The inferior vena cava is dilated in size with <50% respiratory variability, suggesting right atrial pressure of 15 mmHg. Comparison(s): No significant change from prior study. FINDINGS  Left Ventricle: Left ventricular ejection fraction, by estimation, is >75%. The left ventricle has hyperdynamic function. The left ventricle has no regional wall motion abnormalities. The left ventricular internal cavity size was normal in size. There is no left ventricular hypertrophy. The interventricular septum is flattened in systole and diastole, consistent with right ventricular pressure and volume overload. Left ventricular diastolic parameters are consistent with Grade II diastolic dysfunction  (pseudonormalization). Elevated left atrial pressure. Right Ventricle: The right ventricular size is normal. Right ventricular systolic function is normal. There is severely elevated pulmonary artery systolic pressure. The tricuspid regurgitant velocity is 3.37 m/s, and with an assumed right atrial pressure  of 15 mmHg, the estimated right ventricular systolic pressure is 62.9 mmHg. Left Atrium: Left atrial size was severely dilated. Right Atrium: Right atrial size was normal in size. Pericardium: Trivial pericardial effusion is present. Mitral Valve: The mitral valve is normal in structure. Moderate mitral annular calcification. Severe mitral valve regurgitation. No evidence of mitral valve stenosis. MV peak gradient, 18.0 mmHg. The mean mitral valve gradient is 9.0 mmHg. Tricuspid Valve: The tricuspid valve is normal in structure. Tricuspid valve regurgitation is mild to moderate. No evidence of tricuspid  stenosis. Aortic Valve: The aortic valve is tricuspid. Aortic valve regurgitation is moderate. Aortic regurgitation PHT measures 432 msec. Aortic valve sclerosis is present, with no evidence of aortic valve stenosis. Aortic valve peak gradient measures 12.6 mmHg. Pulmonic Valve: The pulmonic valve was normal in  structure. Pulmonic valve regurgitation is trivial. No evidence of pulmonic stenosis. Aorta: The aortic root is normal in size and structure. Venous: The inferior vena cava is dilated in size with less than 50% respiratory variability, suggesting right atrial pressure of 15 mmHg. IAS/Shunts: No atrial level shunt detected by color flow Doppler. Additional Comments: There is a small pleural effusion in the left lateral region.  LEFT VENTRICLE PLAX 2D LVIDd:         4.60 cm   Diastology LVIDs:         2.85 cm   LV e' medial:    3.15 cm/s LV PW:         0.90 cm   LV E/e' medial:  54.9 LV IVS:        0.90 cm   LV e' lateral:   2.59 cm/s LVOT diam:     1.73 cm   LV E/e' lateral: 66.8 LV SV:         55 LV SV Index:   36 LVOT Area:     2.36 cm  RIGHT VENTRICLE             IVC RV Basal diam:  2.80 cm     IVC diam: 3.10 cm RV S prime:     14.00 cm/s TAPSE (M-mode): 2.1 cm LEFT ATRIUM              Index        RIGHT ATRIUM           Index LA diam:        4.70 cm  3.09 cm/m   RA Area:     15.20 cm LA Vol (A2C):   115.0 ml 75.52 ml/m  RA Volume:   33.10 ml  21.74 ml/m LA Vol (A4C):   106.0 ml 69.61 ml/m LA Biplane Vol: 112.0 ml 73.55 ml/m  AORTIC VALVE                 PULMONIC VALVE AV Area (Vmax): 1.85 cm     PV Vmax:       1.16 m/s AV Vmax:        177.50 cm/s  PV Peak grad:  5.4 mmHg AV Peak Grad:   12.6 mmHg LVOT Vmax:      139.00 cm/s LVOT Vmean:     90.100 cm/s LVOT VTI:       0.234 m AI PHT:         432 msec  AORTA Ao Root diam: 2.90 cm Ao Asc diam:  3.30 cm MITRAL VALVE                TRICUSPID VALVE MV Area (PHT): 2.07 cm     TR Peak grad:   45.4 mmHg MV Area VTI:   0.94 cm     TR Vmax:        337.00  cm/s MV Peak grad:  18.0 mmHg MV Mean grad:  9.0 mmHg     SHUNTS MV Vmax:       2.12 m/s     Systemic VTI:  0.23 m MV Vmean:      147.0 cm/s   Systemic Diam: 1.73 cm MV Decel Time: 366 msec MR Peak grad: 83.7 mmHg MR Vmax:      457.50 cm/s MV E velocity: 173.00 cm/s MV A velocity: 150.00 cm/s MV E/A ratio:  1.15 Kirk Ruths MD Electronically signed by Kirk Ruths MD Signature Date/Time: 08/31/2022/12:48:18 PM    Final  Scheduled Meds:  acetaminophen  1,000 mg Oral TID   feeding supplement  237 mL Oral BID BM   levETIRAcetam  250 mg Oral BID   liver oil-zinc oxide   Topical TID   magnesium oxide  400 mg Oral QHS   multivitamin with minerals  1 tablet Oral Daily   senna-docusate  1 tablet Oral BID   sodium chloride flush  3 mL Intravenous Q12H   tacrolimus  2 mg Oral Daily   tacrolimus  3 mg Oral QHS   Continuous Infusions:  sodium chloride     albumin human 25 g (09/01/22 2112)   cefTRIAXone (ROCEPHIN)  IV 2 g (09/02/22 0513)     LOS: 3 days    Time spent: 40 minutes    Irine Seal, MD [facility]   To contact the attending provider between 7A-7P or the covering provider during after hours 7P-7A, please log into the web site [email or web address] and access using universal [place] password for that web site. If you do not have the password, please call the hospital operator.  09/02/2022, 10:54 AM

## 2022-09-02 NOTE — Progress Notes (Signed)
No need of bipap.

## 2022-09-02 NOTE — Progress Notes (Signed)
Patient SpO2 83-88% on 2 L.  Patient assisted into upright position, O2 increased to 4 L.  SpO2 92-96%.  Complains of increased difficulty breathing.  Fine crackles auscultated throughout lung fields.  Unilateral swelling in LLE.  Patient reported pain in LLE when palpated, no pain in right.  MD notified, see new orders.  Angie Fava, RN

## 2022-09-02 NOTE — Progress Notes (Signed)
OT Cancellation Note  Patient Details Name: SHAKIYLA KOOK MRN: [id number] DOB: 1932-08-08   Cancelled Treatment:    Reason Eval/Treat Not Completed: Other (comment) Daughter declined OT for pt stating pt was now Hospice but pt did not yet know yet.  Daughter ask OT to check on pt next day.  Explained OT would offer but not push pt.   Kari Baars, OT Acute Rehabilitation Services [phone] Office- [phone], Thereasa Parkin 09/02/2022, 4:45 PM

## 2022-09-02 NOTE — Progress Notes (Addendum)
DAILY PROGRESS NOTE   Patient Name: [name] Date of Encounter: 09/02/2022 Cardiologist: Jenkins Rouge, MD  Chief Complaint   Weakness  Patient Profile   [name] is a 88 y.o. female with a hx of RBBB, varicose veins, history of seizure, liver cirrhosis s/p transplant at [place] at age 89, prior history of EtOH abuse, history of mild AI and severe MR who is being seen 08/31/2022 for the evaluation of dyspnea and hypoxia at the request of Dr. Roel Cluck.  Subjective   Urine output only 600 cc overnight - labs pending today. Appreciate palliative care evaluation - they will follow-up today.   Objective   Vitals:   09/01/22 1426 09/01/22 2032 09/02/22 0447 09/02/22 0500  BP: 117/61 (!) 116/59 (!) 124/55   Pulse: 88 92 88   Resp: '16 16 19   '$ Temp: 97.8 F (36.6 C) 97.9 F (36.6 C) 97.9 F (36.6 C)   TempSrc: Oral Oral Oral   SpO2: 98% 97% 93%   Weight:    57 kg  Height:        Intake/Output Summary (Last 24 hours) at 09/02/2022 0859 Last data filed at 09/02/2022 1700 Gross per 24 hour  Intake 517.7 ml  Output 600 ml  Net -82.3 ml   Filed Weights   08/31/22 0838 09/01/22 0457 09/02/22 0500  Weight: 54.4 kg 57.1 kg 57 kg    Physical Exam   General appearance: alert, appears stated age, no distress, and thin Neck: no carotid bruit, no JVD, and thyroid not enlarged, symmetric, no tenderness/mass/nodules Lungs: diminished breath sounds bibasilar Heart: regular rate and rhythm, S1, S2 normal, no murmur, click, rub or gallop Abdomen: soft, non-tender; bowel sounds normal; no masses,  no organomegaly Extremities: extremities normal, atraumatic, no cyanosis or edema Pulses: 2+ and symmetric Skin: Skin color, texture, turgor normal. No rashes or lesions Neurologic: Grossly normal Psych: Pleasant  Inpatient Medications    Scheduled Meds:  acetaminophen  1,000 mg Oral TID   feeding supplement  237 mL Oral BID BM   furosemide  80 mg Intravenous BID    levETIRAcetam  250 mg Oral BID   liver oil-zinc oxide   Topical TID   magnesium oxide  400 mg Oral QHS   multivitamin with minerals  1 tablet Oral Daily   senna-docusate  1 tablet Oral BID   sodium chloride flush  3 mL Intravenous Q12H   tacrolimus  2 mg Oral Daily   tacrolimus  3 mg Oral QHS    Continuous Infusions:  sodium chloride     albumin human 25 g (09/01/22 2112)   cefTRIAXone (ROCEPHIN)  IV 2 g (09/02/22 0513)    PRN Meds: sodium chloride, acetaminophen **OR** acetaminophen, HYDROmorphone (DILAUDID) injection, lip balm, prochlorperazine, sodium chloride flush   Labs   Results for orders placed or performed during the hospital encounter of 08/30/22 (from the past 48 hour(s))  Comprehensive metabolic panel     Status: Abnormal   Collection Time: 09/01/22  8:28 AM  Result Value Ref Range   Sodium 136 135 - 145 mmol/L   Potassium 5.0 3.5 - 5.1 mmol/L   Chloride 97 (L) 98 - 111 mmol/L   CO2 29 22 - 32 mmol/L   Glucose, Bld 84 70 - 99 mg/dL    Comment: Glucose reference range applies only to samples taken after fasting for at least 8 hours.   BUN 41 (H) 8 - 23 mg/dL   Creatinine, Ser 2.01 (H) 0.44 -  1.00 mg/dL   Calcium 8.1 (L) 8.9 - 10.3 mg/dL   Total Protein 6.2 (L) 6.5 - 8.1 g/dL   Albumin 2.7 (L) 3.5 - 5.0 g/dL   AST 14 (L) 15 - 41 U/L   ALT 11 0 - 44 U/L   Alkaline Phosphatase 62 38 - 126 U/L   Total Bilirubin 0.7 0.3 - 1.2 mg/dL   GFR, Estimated 24 (L) >60 mL/min    Comment: (NOTE) Calculated using the CKD-EPI Creatinine Equation (2021)    Anion gap 10 5 - 15    Comment: Performed at [facility], [place] [address], [place], [place] [address]  CBC with Differential/Platelet     Status: Abnormal   Collection Time: 09/01/22  8:28 AM  Result Value Ref Range   WBC 8.3 4.0 - 10.5 K/uL   RBC 4.14 3.87 - 5.11 MIL/uL   Hemoglobin 13.7 12.0 - 15.0 g/dL   HCT 44.6 36.0 - 46.0 %   MCV 107.7 (H) 80.0 - 100.0 fL   MCH 33.1 26.0 - 34.0 pg   MCHC 30.7  30.0 - 36.0 g/dL   RDW 15.7 (H) 11.5 - 15.5 %   Platelets 165 150 - 400 K/uL   nRBC 0.0 0.0 - 0.2 %   Neutrophils Relative % 82 %   Neutro Abs 6.8 1.7 - 7.7 K/uL   Lymphocytes Relative 9 %   Lymphs Abs 0.7 0.7 - 4.0 K/uL   Monocytes Relative 7 %   Monocytes Absolute 0.6 0.1 - 1.0 K/uL   Eosinophils Relative 2 %   Eosinophils Absolute 0.2 0.0 - 0.5 K/uL   Basophils Relative 0 %   Basophils Absolute 0.0 0.0 - 0.1 K/uL   Immature Granulocytes 0 %   Abs Immature Granulocytes 0.03 0.00 - 0.07 K/uL    Comment: Performed at [facility], [place] [address], [place], [place] [address]  Magnesium     Status: None   Collection Time: 09/01/22  8:28 AM  Result Value Ref Range   Magnesium 2.1 1.7 - 2.4 mg/dL    Comment: Performed at [facility], [place] [address] [place], [place] [address]    ECG   N/A  Telemetry   Sinus rhythm with RBBB - Personally Reviewed  Radiology    ECHOCARDIOGRAM COMPLETE  Result Date: 08/31/2022    ECHOCARDIOGRAM REPORT   Patient Name:   [name] Date of Exam: 08/31/2022 Medical Rec #:  [id number]       Height:       63.0 in Accession #:    [id number]      Weight:       114.0 lb Date of Birth:  1932/10/23       BSA:          1.523 m Patient Age:    69 years        BP:           119/56 mmHg Patient Gender: F               HR:           89 bpm. Exam Location:  Inpatient Procedure: 2D Echo, Cardiac Doppler and Color Doppler Indications:    CHF  History:        Patient has prior history of Echocardiogram examinations, most                 recent 04/25/2022.  Sonographer:    Jefferey Pica Referring Phys:  [place] Left ventricular ejection fraction, by estimation, is >75%. The left ventricle has hyperdynamic function. The left ventricle has no regional wall motion abnormalities. Left ventricular diastolic parameters are consistent with Grade II diastolic dysfunction (pseudonormalization). Elevated left  atrial pressure. There is the interventricular septum is flattened in systole and diastole, consistent with right ventricular pressure and volume overload.  2. Right ventricular systolic function is normal. The right ventricular size is normal. There is severely elevated pulmonary artery systolic pressure.  3. Left atrial size was severely dilated.  4. The mitral valve is normal in structure. Severe mitral valve regurgitation. No evidence of mitral stenosis. Moderate mitral annular calcification.  5. Tricuspid valve regurgitation is mild to moderate.  6. The aortic valve is tricuspid. Aortic valve regurgitation is moderate. Aortic valve sclerosis is present, with no evidence of aortic valve stenosis.  7. The inferior vena cava is dilated in size with <50% respiratory variability, suggesting right atrial pressure of 15 mmHg. Comparison(s): No significant change from prior study. FINDINGS  Left Ventricle: Left ventricular ejection fraction, by estimation, is >75%. The left ventricle has hyperdynamic function. The left ventricle has no regional wall motion abnormalities. The left ventricular internal cavity size was normal in size. There is no left ventricular hypertrophy. The interventricular septum is flattened in systole and diastole, consistent with right ventricular pressure and volume overload. Left ventricular diastolic parameters are consistent with Grade II diastolic dysfunction  (pseudonormalization). Elevated left atrial pressure. Right Ventricle: The right ventricular size is normal. Right ventricular systolic function is normal. There is severely elevated pulmonary artery systolic pressure. The tricuspid regurgitant velocity is 3.37 m/s, and with an assumed right atrial pressure  of 15 mmHg, the estimated right ventricular systolic pressure is 26.9 mmHg. Left Atrium: Left atrial size was severely dilated. Right Atrium: Right atrial size was normal in size. Pericardium: Trivial pericardial effusion is  present. Mitral Valve: The mitral valve is normal in structure. Moderate mitral annular calcification. Severe mitral valve regurgitation. No evidence of mitral valve stenosis. MV peak gradient, 18.0 mmHg. The mean mitral valve gradient is 9.0 mmHg. Tricuspid Valve: The tricuspid valve is normal in structure. Tricuspid valve regurgitation is mild to moderate. No evidence of tricuspid stenosis. Aortic Valve: The aortic valve is tricuspid. Aortic valve regurgitation is moderate. Aortic regurgitation PHT measures 432 msec. Aortic valve sclerosis is present, with no evidence of aortic valve stenosis. Aortic valve peak gradient measures 12.6 mmHg. Pulmonic Valve: The pulmonic valve was normal in structure. Pulmonic valve regurgitation is trivial. No evidence of pulmonic stenosis. Aorta: The aortic root is normal in size and structure. Venous: The inferior vena cava is dilated in size with less than 50% respiratory variability, suggesting right atrial pressure of 15 mmHg. IAS/Shunts: No atrial level shunt detected by color flow Doppler. Additional Comments: There is a small pleural effusion in the left lateral region.  LEFT VENTRICLE PLAX 2D LVIDd:         4.60 cm   Diastology LVIDs:         2.85 cm   LV e' medial:    3.15 cm/s LV PW:         0.90 cm   LV E/e' medial:  54.9 LV IVS:        0.90 cm   LV e' lateral:   2.59 cm/s LVOT diam:     1.73 cm   LV E/e' lateral: 66.8 LV SV:         55  LV SV Index:   36 LVOT Area:     2.36 cm  RIGHT VENTRICLE             IVC RV Basal diam:  2.80 cm     IVC diam: 3.10 cm RV S prime:     14.00 cm/s TAPSE (M-mode): 2.1 cm LEFT ATRIUM              Index        RIGHT ATRIUM           Index LA diam:        4.70 cm  3.09 cm/m   RA Area:     15.20 cm LA Vol (A2C):   115.0 ml 75.52 ml/m  RA Volume:   33.10 ml  21.74 ml/m LA Vol (A4C):   106.0 ml 69.61 ml/m LA Biplane Vol: 112.0 ml 73.55 ml/m  AORTIC VALVE                 PULMONIC VALVE AV Area (Vmax): 1.85 cm     PV Vmax:       1.16 m/s  AV Vmax:        177.50 cm/s  PV Peak grad:  5.4 mmHg AV Peak Grad:   12.6 mmHg LVOT Vmax:      139.00 cm/s LVOT Vmean:     90.100 cm/s LVOT VTI:       0.234 m AI PHT:         432 msec  AORTA Ao Root diam: 2.90 cm Ao Asc diam:  3.30 cm MITRAL VALVE                TRICUSPID VALVE MV Area (PHT): 2.07 cm     TR Peak grad:   45.4 mmHg MV Area VTI:   0.94 cm     TR Vmax:        337.00 cm/s MV Peak grad:  18.0 mmHg MV Mean grad:  9.0 mmHg     SHUNTS MV Vmax:       2.12 m/s     Systemic VTI:  0.23 m MV Vmean:      147.0 cm/s   Systemic Diam: 1.73 cm MV Decel Time: 366 msec MR Peak grad: 83.7 mmHg MR Vmax:      457.50 cm/s MV E velocity: 173.00 cm/s MV A velocity: 150.00 cm/s MV E/A ratio:  1.15 Kirk Ruths MD Electronically signed by Kirk Ruths MD Signature Date/Time: 08/31/2022/12:48:18 PM    Final     Cardiac Studies   See echo above  Assessment   Principal Problem:   Acute on chronic diastolic CHF (congestive heart failure) (HCC) Active Problems:   Hepatic cirrhosis (HCC)   Liver replaced by transplant ([place])   Seizure disorder (HCC)   Mitral valve insufficiency and aortic valve insufficiency   CKD (chronic kidney disease), stage III (HCC)   Elevated troponin   Acute respiratory failure with hypoxia (HCC)   UTI (urinary tract infection)   Acute on chronic congestive heart failure (HCC)   Plan   Minimal output overnight on higher dose lasix - labs pending today.  Will hold lasix today pending labwork. LV filling pressure elevated, but does not appear overtly overloaded today - noted low albumin but given repletion. Was dizzy yesterday with ambulation, may be orthostatic. Monitor potassium in the setting of AKI -she is not on supplements.   Time Spent Directly with Patient:  I have spent a total of 25 minutes with the  patient reviewing hospital notes, telemetry, EKGs, labs and examining the patient as well as establishing an assessment and plan that was discussed personally with the  patient.  > 50% of time was spent in direct patient care.  Length of Stay:  LOS: 3 days   Pixie Casino, MD, [facility], [place] Director of the Advanced Lipid Disorders &  Cardiovascular Risk Reduction Clinic Diplomate of the American Board of Clinical Lipidology Attending Cardiologist  Direct Dial: [phone]  Fax: [phone]  Website:  [email or web address] [email or web address] Braylinn Gulden 09/02/2022, 8:59 AM

## 2022-09-03 DIAGNOSIS — R7989 Other specified abnormal findings of blood chemistry: Secondary | ICD-10-CM | POA: Diagnosis not present

## 2022-09-03 DIAGNOSIS — N1832 Chronic kidney disease, stage 3b: Secondary | ICD-10-CM | POA: Diagnosis not present

## 2022-09-03 DIAGNOSIS — J9601 Acute respiratory failure with hypoxia: Secondary | ICD-10-CM | POA: Diagnosis not present

## 2022-09-03 DIAGNOSIS — I5033 Acute on chronic diastolic (congestive) heart failure: Secondary | ICD-10-CM | POA: Diagnosis not present

## 2022-09-03 LAB — CBC WITH DIFFERENTIAL/PLATELET
Abs Immature Granulocytes: 0.03 10*3/uL (ref 0.00–0.07)
Basophils Absolute: 0 10*3/uL (ref 0.0–0.1)
Basophils Relative: 1 %
Eosinophils Absolute: 0.2 10*3/uL (ref 0.0–0.5)
Eosinophils Relative: 3 %
HCT: 42.2 % (ref 36.0–46.0)
Hemoglobin: 13.1 g/dL (ref 12.0–15.0)
Immature Granulocytes: 1 %
Lymphocytes Relative: 9 %
Lymphs Abs: 0.5 10*3/uL — ABNORMAL LOW (ref 0.7–4.0)
MCH: 32.8 pg (ref 26.0–34.0)
MCHC: 31 g/dL (ref 30.0–36.0)
MCV: 105.8 fL — ABNORMAL HIGH (ref 80.0–100.0)
Monocytes Absolute: 0.5 10*3/uL (ref 0.1–1.0)
Monocytes Relative: 8 %
Neutro Abs: 4.5 10*3/uL (ref 1.7–7.7)
Neutrophils Relative %: 78 %
Platelets: 158 10*3/uL (ref 150–400)
RBC: 3.99 MIL/uL (ref 3.87–5.11)
RDW: 14.6 % (ref 11.5–15.5)
WBC: 5.7 10*3/uL (ref 4.0–10.5)
nRBC: 0 % (ref 0.0–0.2)

## 2022-09-03 LAB — BASIC METABOLIC PANEL
Anion gap: 8 (ref 5–15)
BUN: 51 mg/dL — ABNORMAL HIGH (ref 8–23)
CO2: 30 mmol/L (ref 22–32)
Calcium: 7.8 mg/dL — ABNORMAL LOW (ref 8.9–10.3)
Chloride: 94 mmol/L — ABNORMAL LOW (ref 98–111)
Creatinine, Ser: 1.97 mg/dL — ABNORMAL HIGH (ref 0.44–1.00)
GFR, Estimated: 24 mL/min — ABNORMAL LOW (ref >60)
Glucose, Bld: 85 mg/dL (ref 70–99)
Potassium: 4.2 mmol/L (ref 3.5–5.1)
Sodium: 132 mmol/L — ABNORMAL LOW (ref 135–145)

## 2022-09-03 LAB — TACROLIMUS LEVEL: Tacrolimus (FK506) - LabCorp: 22.6 ng/mL — ABNORMAL HIGH (ref 2.0–20.0)

## 2022-09-03 MED ORDER — FUROSEMIDE 10 MG/ML IJ SOLN
80.0000 mg | Freq: Every day | INTRAMUSCULAR | Status: DC
  Administered 2022-09-03 – 2022-09-04 (×2): 80 mg via INTRAVENOUS
  Filled 2022-09-03 (×2): qty 8

## 2022-09-03 MED ORDER — CEFDINIR 300 MG PO CAPS
300.0000 mg | ORAL_CAPSULE | Freq: Every day | ORAL | Status: AC
  Administered 2022-09-03 – 2022-09-04 (×2): 300 mg via ORAL
  Filled 2022-09-03 (×2): qty 1

## 2022-09-03 NOTE — Plan of Care (Signed)
  Problem: Education: Goal: Ability to verbalize understanding of medication therapies will improve Outcome: Adequate for Discharge   Problem: Education: Goal: Knowledge of General Education information will improve Description: Including pain rating scale, medication(s)/side effects and non-pharmacologic comfort measures Outcome: Adequate for Discharge

## 2022-09-03 NOTE — Progress Notes (Signed)
OT Cancellation Note and Discharge  Patient Details Name: [name] MRN: [id number] DOB: 02-18-1932   Cancelled Treatment:    Reason Eval/Treat Not Completed: Other (comment). Checked in on pt today with dtr and no OT needs due to patient is now on Hospice and dtr and RN plan getting pt up to chair today.  [place], OTR/L [facility] Re[facility] [phone] Office [phone]    Almon Register 09/03/2022, 11:48 AM

## 2022-09-03 NOTE — Care Management Important Message (Signed)
Important Message  Patient Details Hospice Name: [name] MRN: [id number] Date of Birth: 13-Jan-1932   Medicare Important Message Given:  No     Kerin Salen 09/03/2022, 12:26 PM

## 2022-09-03 NOTE — TOC Progression Note (Addendum)
Transition of Care [facility]) - Progression Note    Patient Details  Name: [name] MRN: [id number] Date of Birth: 04/11/1932  Transition of Care [facility]) CM/SW [place], RN Phone Number: 09/03/2022, 3:44 PM  Clinical Narrative:   Per chart review patient and family selected inpt unit hospice services. Dr. Hilma Favors made referral to [facility], this [facility] notified MD to determine if any assistance needed. No TOC consult on file.   TOC will continue to follow.  - MD Hilma Favors advised referral has not been made for hospice, request TOC to make referral. Request TOC consult. - Spoke with [name] who prefers [facility].    - 4:00pm referral made to [facility] who will contact patient's daughter tomorrow. Notified hospice representative patient's daughter prefers a call after 10am tomorrow   TOC will continue to follow.  Expected Discharge Plan: [place] Barriers to Discharge: Continued Medical Work up  Expected Discharge Plan and Services Expected Discharge Plan: [place] In-house Referral: NA Discharge Planning Services: CM Consult   Living arrangements for the past 2 months: Apartment                                       Social Determinants of Health (SDOH) Interventions    Readmission Risk Interventions     No data to display

## 2022-09-03 NOTE — Progress Notes (Addendum)
Rounding Note    Patient Name: [name] Date of Encounter: 09/03/2022  [place] Cardiologist: Jenkins Rouge, MD   Subjective   Yesterday afternoon she had hypoxic event requiring increased oxygen at 4 L from 2 L.  She was given IV Lasix 80 mg.  Breathing improved with net INO of -350 cc yesterday.  Has 300 cc in urinal.  Still on 4 L oxygen.  She is now on hospice care.  Laying comfortably in bed.  Inpatient Medications    Scheduled Meds:  acetaminophen  1,000 mg Oral TID   cefdinir  300 mg Oral Daily   diazepam  2.5 mg Intravenous TID   feeding supplement  237 mL Oral BID BM    HYDROmorphone (DILAUDID) injection  0.1 mg Intravenous Q4H   levETIRAcetam  250 mg Oral BID   liver oil-zinc oxide   Topical TID   magnesium oxide  400 mg Oral QHS   multivitamin with minerals  1 tablet Oral Daily   senna-docusate  1 tablet Oral BID   sodium chloride flush  3 mL Intravenous Q12H   tacrolimus  2 mg Oral Daily   tacrolimus  3 mg Oral QHS   Continuous Infusions:  sodium chloride     PRN Meds: sodium chloride, acetaminophen **OR** acetaminophen, HYDROmorphone (DILAUDID) injection, lip balm, Muscle Rub, prochlorperazine, sodium chloride flush   Vital Signs    Vitals:   09/02/22 1441 09/02/22 2302 09/03/22 0453 09/03/22 0454  BP:  (!) 119/58 135/62   Pulse:  85    Resp:  18 16   Temp:  97.9 F (36.6 C) 97.9 F (36.6 C)   TempSrc:  Axillary Axillary   SpO2: 94% 99% 97%   Weight:    53.4 kg  Height:        Intake/Output Summary (Last 24 hours) at 09/03/2022 0912 Last data filed at 09/03/2022 0448 Gross per 24 hour  Intake 220 ml  Output 575 ml  Net -355 ml      09/03/2022    4:54 AM 09/02/2022    5:00 AM 09/01/2022    4:57 AM  Last 3 Weights  Weight (lbs) 117 lb 11.6 oz 125 lb 10.6 oz 125 lb 14.1 oz  Weight (kg) 53.4 kg 57 kg 57.1 kg      Telemetry    SR - Personally Reviewed  ECG    N/A  Physical Exam   GEN: Ill appearing elderly female  in no acute distress.   Neck: No JVD Cardiac: RRR, + murmurs, rubs, or gallops.  Respiratory: Clear to auscultation bilaterally. GI: Soft, nontender, non-distended  MS: No edema; No deformity. Neuro:  Nonfocal  Psych: Normal affect   Labs    High Sensitivity Troponin:   Recent Labs  Lab 08/30/22 1643 08/30/22 1813 08/30/22 2300 08/31/22 0100  TROPONINIHS 34* 31* 32* 39*     Chemistry Recent Labs  Lab 08/30/22 1643 08/31/22 0100 09/01/22 0828 09/02/22 1112 09/03/22 0414  NA 134* 132* 136 132* 132*  K 4.7 4.5 5.0 4.5 4.2  CL 98 95* 97* 94* 94*  CO2 '30 26 29 27 30  '$ GLUCOSE 92 171* 84 120* 85  BUN 33* 33* 41* 55* 51*  CREATININE 1.24* 1.40* 2.01* 2.19* 1.97*  CALCIUM 9.0 8.6* 8.1* 7.9* 7.8*  MG  --  2.0 2.1 2.0  --   PROT 6.5 7.0 6.2*  --   --   ALBUMIN 3.1* 3.3* 2.7*  --   --  AST 19 22 14*  --   --   ALT '13 13 11  '$ --   --   ALKPHOS 66 71 62  --   --   BILITOT 1.2 1.8* 0.7  --   --   GFRNONAA 42* 36* 24* 21* 24*  ANIONGAP '6 11 10 11 8    '$ Lipids No results for input(s): "CHOL", "TRIG", "HDL", "LABVLDL", "LDLCALC", "CHOLHDL" in the last 168 hours.  Hematology Recent Labs  Lab 09/01/22 0828 09/02/22 1112 09/03/22 0414  WBC 8.3 7.6 5.7  RBC 4.14 4.12 3.99  HGB 13.7 13.5 13.1  HCT 44.6 43.8 42.2  MCV 107.7* 106.3* 105.8*  MCH 33.1 32.8 32.8  MCHC 30.7 30.8 31.0  RDW 15.7* 14.9 14.6  PLT 165 173 158   Thyroid  Recent Labs  Lab 08/28/22 1614  TSH 1.01    BNP Recent Labs  Lab 08/30/22 1643  BNP 1,397.1*    DDimer No results for input(s): "DDIMER" in the last 168 hours.   Radiology    No results found.  Cardiac Studies   Echo 08/31/2022 1. Left ventricular ejection fraction, by estimation, is >75%. The left  ventricle has hyperdynamic function. The left ventricle has no regional  wall motion abnormalities. Left ventricular diastolic parameters are  consistent with Grade II diastolic  dysfunction (pseudonormalization). Elevated left atrial  pressure. There is  the interventricular septum is flattened in systole and diastole,  consistent with right ventricular pressure and volume overload.   2. Right ventricular systolic function is normal. The right ventricular  size is normal. There is severely elevated pulmonary artery systolic  pressure.   3. Left atrial size was severely dilated.   4. The mitral valve is normal in structure. Severe mitral valve  regurgitation. No evidence of mitral stenosis. Moderate mitral annular  calcification.   5. Tricuspid valve regurgitation is mild to moderate.   6. The aortic valve is tricuspid. Aortic valve regurgitation is moderate.  Aortic valve sclerosis is present, with no evidence of aortic valve  stenosis.   7. The inferior vena cava is dilated in size with <50% respiratory  variability, suggesting right atrial pressure of 15 mmHg.   Comparison(s): No significant change from prior study.   Patient Profile     [name] is a 88 y.o. female with a hx of RBBB, varicose veins, history of seizure, liver cirrhosis s/p transplant at [place] at age 54, prior history of EtOH abuse, history of mild AI and severe MR who is being seen 08/31/2022 for the evaluation of dyspnea and hypoxia at the request of Dr. Roel Cluck.   Assessment & Plan    Acute on chronic diastolic heart failure Severe mitral regurgitation -BNP 1397.  -Echocardiogram with LV function greater than 99%, grade 2 diastolic dysfunction, parameters consistent with volume overload.  Severe mitral regurgitation -Did not felt candidate for MitraClip -Albumin supplemented -Plan was to hold Lasix yesterday when seen by Dr. Debara Pickett but later required IV Lasix 80 mg due to hypoxia.  Breathing improved with improved renal function.  He is now on hospice care.  Appreciate palliative care onboarding. -Currently on 4 L oxygen with stable breathing -Use IV Lasix as needed versus daily  Chronic kidney disease stage IIIb - Renal function  improved to 1.97 from 2.19. - Seems baseline Scr 1.2-1.5  For questions or updates, please contact [place] Please consult [email or web address] for contact info under        Signed, Leanor Kail, PA  09/03/2022, 9:12 AM      Attending Note:   The patient was seen and examined.  Agree with assessment and plan as noted above.  Changes made to the above note as needed.  Patient seen and independently examined with Robbie Lis, PA .   We discussed all aspects of the encounter. I agree with the assessment and plan as stated above.   Acute on chronic respiratory failure: Secondary to acute on chronic diastolic congestive heart failure and severe mitral regurgitation. She was given Lasix and has responded quite nicely. She still net 215 cc positive for the admission.  Continue Lasix 80 mg IV daily Potassium levels look good   2.  MR : She is not a candidate for MitraClip.        I have spent a total of 40 minutes with patient reviewing hospital  notes , telemetry, EKGs, labs and examining patient as well as establishing an assessment and plan that was discussed with the patient.  > 50% of time was spent in direct patient care.    Thayer Headings, Brooke Bonito., MD, [facility] 09/03/2022, 9:45 AM [address],  [place] Pager [phone]

## 2022-09-03 NOTE — Progress Notes (Signed)
Manufacturing engineer [facility]) Hospital Liaison Note  Referral received for patient/family interest in [facility]. Chart under review by [facility] physician.   Hospice eligibility pending.   Please call with any questions or concerns. Thank you  Roselee Nova, [place] Hospital Liaison [phone]

## 2022-09-03 NOTE — Progress Notes (Signed)
PT Cancellation Note  Patient Details Name: [name] MRN: [id number] DOB: [birth year]   Cancelled Treatment:    Reason Eval/Treat Not Completed: Medical issues which prohibited therapy (per palliative, pt is near end of life, PT signing off.)   Philomena Doheny PT 09/03/2022  [place]  Office [phone]

## 2022-09-03 NOTE — TOC Progression Note (Signed)
Transition of Care [facility]) - Progression Note    Patient Details  Name: [name] MRN: [id number] Date of Birth: 1932-06-27  Transition of Care [facility]) CM/SW [place], RN Phone Number: 09/03/2022, 4:37 PM  Clinical Narrative:   Received in bound call from [name], reporting her mother's kidney function has improved and she would like more time to make a decision for [facility] hospice at [facility]. This RNCM advised [name] to speak with the doctor regarding her mother's medical status and a representative from [facility] will call her in the am.   TOC will continue to follow.    Expected Discharge Plan: [place] Barriers to Discharge: Continued Medical Work up  Expected Discharge Plan and Services Expected Discharge Plan: [place] In-house Referral: NA Discharge Planning Services: CM Consult   Living arrangements for the past 2 months: Apartment                                       Social Determinants of Health (SDOH) Interventions    Readmission Risk Interventions     No data to display

## 2022-09-03 NOTE — Progress Notes (Signed)
PROGRESS NOTE    [name]  OIN:[id number] DOB: 1932-05-10 DOA: 08/30/2022 PCP: Marin Olp, MD   Chief Complaint  Patient presents with   Fatigue   Diarrhea    Brief Narrative:  Patient is a pleasant 88 year old female with history significant of diastolic CHF, seizure disorder on Keppra, history of cirrhosis status post liver transplant on Prograf, CKD stage III, degenerative mitral valve disease with severe MVR who was admitted with acute respiratory failure with hypoxia secondary to acute on chronic diastolic failure which was likely in the setting of worsening mitral valve disease, UTI placed on IV antibiotics.  Cardiology consulted and following.    Assessment & Plan:   Principal Problem:   Acute on chronic diastolic CHF (congestive heart failure) (HCC) Active Problems:   Hepatic cirrhosis (HCC)   Liver replaced by transplant ([place])   Seizure disorder ([place])   Mitral valve insufficiency and aortic valve insufficiency   CKD (chronic kidney disease), stage III (HCC)   Elevated troponin   Acute respiratory failure with hypoxia (HCC)   UTI (urinary tract infection)   Acute on chronic congestive heart failure (HCC)   E. coli UTI  #1 acute respiratory failure with hypoxia secondary to acute on chronic diastolic CHF exacerbation likely secondary to worsening severe MVR. -Patient admitted with shortness of breath, generalized fatigue, noted to be hypoxic on room air. -BNP on admission noted to be 1397. -Chest x-ray with cardiomegaly with bilateral airspace disease, likely edema/CHF. -CT angiogram chest done negative for PE, however with cardiomegaly with bilateral airspace disease and effusions likely CHF, CAD, aortic atherosclerosis noted. -2D echo ordered and done with a EF > 70%,JGGE, grade 2 diastolic dysfunction, severely dilated left atrial size, severe MVR, mild to moderate TVR, moderate AVR. -Patient currently on 2 L nasal cannula with sats ranging from 92 to  97%. -Patient was on Lasix 40 mg IV every 12 hours, however no significant urine output recorded/diuresis. -Patient seen in consultation by cardiology and feels patient's acute CHF likely secondary to worsening mitral valve disease, it is noted per cardiology that patient with no options for cardiac surgery.  Patient also noted not to be a good candidate for mitral valve clipping.  Cardiology recommending continuation of IV diuresis with palliative care consultation. -IV Lasix increased to 80 mg twice daily per cardiology on 09/01/2022. -Due to creatinine creeping up cardiology IV Lasix held.  -Patient noted on 09/02/2022 to have worsening shortness of breath received IV albumin and a dose of Lasix 80 mg IV x1 with urine output of 575 cc over the past 24 hours.  Unsure of accuracy of urine output results.  Creatinine seems to be trending back down.  -Status post IV albumin.   -Patient currently on Lasix 80 mg IV daily per cardiology recommendations. -Cardiology following and appreciate input and recommendations. -Palliative care following and patient transitioning to full comfort measures. -Patient likely to discharge to residential hospice home.  2.  Worsening severe MVR -Patient seen by cardiology felt not to be a candidate for surgery including MitraClip. -Repeat 2D echo done during this hospitalization with severe MVR, severely dilated left atrial size, mild to moderate TVR, moderate AVR, EF > 36%, grade 2 diastolic dysfunction. -Cardiology recommending palliative care consultation. -Cardiology following. -Palliative care following and patient to transition to full comfort measures.  3.  E, Coli UTI -Urine cultures with > 100,000 colonies of E. coli which is pansensitive.   -Was on IV Rocephin and transition to oral Ceftin to  complete a 5-day course of treatment.  4.  Seizure disorder -No seizures noted.   -Continue home regimen Keppra 250 mg twice daily.  5.  CKD stage IIIb -Renal  function fluctuating. -Creatinine initially was trending up but trending back down.  -IV Lasix initially held however due to worsening shortness of breath on 09/02/2022 patient received IV albumin and Lasix 80 mg IV x1 with clinical improvement. -Avoid nephrotoxic agents such as NSAIDs, vancomycin and Zosyn combo, avoid hypotension. -Patient being transitioned to full comfort measures.  6.  Elevated troponin -Elevated but flat. -Like secondary to acute CHF exacerbation. -Cardiology following. -No further work-up at this time.  7.  History of liver cirrhosis status post transplant at [facility] 15 years ago -Stable. -Patient being transitioned to comfort measures and will likely be discharged to residential hospice home.   DVT prophylaxis: SCDs Code Status: DNR (discussed with daughter, POA, son, patient at bedside) Family Communication: Updated patient, daughter and son-in-law. Disposition: Residential hospice home hopefully in the next 24 hours.  Status is: Inpatient Remains inpatient appropriate because: Required IV diuresis, and acute respiratory failure with hypoxia, severity of illness.   Consultants:  Palliative care Katie Masters, DO 08/31/2022 Cardiology: Dr.O'Neal 08/31/2022  Procedures:  2D echo 08/31/2022 CT angiogram chest 08/30/2022 CT abdomen and pelvis 08/30/2022 Chest x-ray 08/30/2022  Antimicrobials:  IV Rocephin 08/30/2022>>>> 09/02/2022 Ceftin 09/03/2022 >>>>   Subjective: Laying in bed.  Still with fatigue.  No nausea or emesis.  Tolerating soft diet.  Still with some shortness of breath but feels improving.  No dysuria.  Abdominal pain improved.  Son-in-law at bedside.   Objective: Vitals:   09/02/22 1441 09/02/22 2302 09/03/22 0453 09/03/22 0454  BP:  (!) 119/58 135/62   Pulse:  85    Resp:  18 16   Temp:  97.9 F (36.6 C) 97.9 F (36.6 C)   TempSrc:  Axillary Axillary   SpO2: 94% 99% 97%   Weight:    53.4 kg  Height:        Intake/Output Summary (Last  24 hours) at 09/03/2022 1031 Last data filed at 09/03/2022 0448 Gross per 24 hour  Intake 120 ml  Output 575 ml  Net -455 ml    Filed Weights   09/01/22 0457 09/02/22 0500 09/03/22 0454  Weight: 57.1 kg 57 kg 53.4 kg    Examination:  General exam: NAD. Respiratory system: Bibasilar crackles noted.  No wheezes, no rhonchi.  Fair air movement.  Speaking in full sentences. Cardiovascular system: RRR no murmurs rubs or gallops.  No JVD.  No lower extremity edema.  Gastrointestinal system: Abdomen is soft, nondistended, positive bowel sounds, decreased tenderness to palpation in the suprapubic region.  No rebound.  No guarding.    Central nervous system: Alert and oriented. No focal neurological deficits. Extremities: Symmetric 5 x 5 power. Skin: No rashes, lesions or ulcers Psychiatry: Judgement and insight appear normal. Mood & affect appropriate.     Data Reviewed: I have personally reviewed following labs and imaging studies  CBC: Recent Labs  Lab 08/28/22 1614 08/30/22 1524 08/30/22 1643 08/31/22 0100 09/01/22 0828 09/02/22 1112 09/03/22 0414  WBC 5.2   < > 3.9* 12.8* 8.3 7.6 5.7  NEUTROABS 3.6  --   --   --  6.8 6.5 4.5  HGB 13.9   < > 13.7 14.9 13.7 13.5 13.1  HCT 43.9   < > 44.5 47.5* 44.6 43.8 42.2  MCV 105.3*   < > 107.0* 106.3* 107.7* 106.3*  105.8*  PLT 220.0   < > 202 246 165 173 158   < > = values in this interval not displayed.     Basic Metabolic Panel: Recent Labs  Lab 08/30/22 1643 08/31/22 0100 09/01/22 0828 09/02/22 1112 09/03/22 0414  NA 134* 132* 136 132* 132*  K 4.7 4.5 5.0 4.5 4.2  CL 98 95* 97* 94* 94*  CO2 '30 26 29 27 30  '$ GLUCOSE 92 171* 84 120* 85  BUN 33* 33* 41* 55* 51*  CREATININE 1.24* 1.40* 2.01* 2.19* 1.97*  CALCIUM 9.0 8.6* 8.1* 7.9* 7.8*  MG  --  2.0 2.1 2.0  --      GFR: Estimated Creatinine Clearance: 16.6 mL/min (A) (by C-G formula based on SCr of 1.97 mg/dL (H)).  Liver Function Tests: Recent Labs  Lab  08/28/22 1614 08/30/22 1643 08/31/22 0100 09/01/22 0828  AST '19 19 22 '$ 14*  ALT '12 13 13 11  '$ ALKPHOS 72 66 71 62  BILITOT 0.9 1.2 1.8* 0.7  PROT 7.4 6.5 7.0 6.2*  ALBUMIN 3.6 3.1* 3.3* 2.7*     CBG: Recent Labs  Lab 09/02/22 1419  GLUCAP 171*     Recent Results (from the past 240 hour(s))  SARS Coronavirus 2 by RT PCR (hospital order, performed in [facility] hospital lab) *cepheid single result test* Anterior Nasal Swab     Status: None   Collection Time: 08/30/22  4:49 PM   Specimen: Anterior Nasal Swab  Result Value Ref Range Status   SARS Coronavirus 2 by RT PCR NEGATIVE NEGATIVE Final    Comment: (NOTE) SARS-CoV-2 target nucleic acids are NOT DETECTED.  The SARS-CoV-2 RNA is generally detectable in upper and lower respiratory specimens during the acute phase of infection. The lowest concentration of SARS-CoV-2 viral copies this assay can detect is 250 copies / mL. A negative result does not preclude SARS-CoV-2 infection and should not be used as the sole basis for treatment or other patient management decisions.  A negative result may occur with improper specimen collection / handling, submission of specimen other than nasopharyngeal swab, presence of viral mutation(s) within the areas targeted by this assay, and inadequate number of viral copies (<250 copies / mL). A negative result must be combined with clinical observations, patient history, and epidemiological information.  Fact Sheet for Patients:   https://www.patel.info/  Fact Sheet for Healthcare Providers: https://hall.com/  This test is not yet approved or  cleared by the [place] FDA and has been authorized for detection and/or diagnosis of SARS-CoV-2 by FDA under an Emergency Use Authorization (EUA).  This EUA will remain in effect (meaning this test can be used) for the duration of the COVID-19 declaration under Section 564(b)(1) of the Act, 21  U.S.C. section 360bbb-3(b)(1), unless the authorization is terminated or revoked sooner.  Performed at [facility], [place] [address], [place], [place] [address]   Urine Culture     Status: Abnormal   Collection Time: 08/30/22 10:45 PM   Specimen: Urine, Clean Catch  Result Value Ref Range Status   Specimen Description   Final    URINE, CLEAN CATCH Performed at [facility], [place] [address] [place], [place] [address]    Special Requests   Final    NONE Performed at [facility], [place] [address], [place],  [address]    Culture >=100,000 COLONIES/mL ESCHERICHIA COLI (A)  Final   Report Status 09/02/2022 FINAL  Final   Organism ID, Bacteria  ESCHERICHIA COLI (A)  Final      Susceptibility   Escherichia coli - MIC*    AMPICILLIN <=2 SENSITIVE Sensitive     CEFAZOLIN <=4 SENSITIVE Sensitive     CEFEPIME <=0.12 SENSITIVE Sensitive     CEFTRIAXONE <=0.25 SENSITIVE Sensitive     CIPROFLOXACIN <=0.25 SENSITIVE Sensitive     GENTAMICIN <=1 SENSITIVE Sensitive     IMIPENEM <=0.25 SENSITIVE Sensitive     NITROFURANTOIN <=16 SENSITIVE Sensitive     TRIMETH/SULFA <=20 SENSITIVE Sensitive     AMPICILLIN/SULBACTAM <=2 SENSITIVE Sensitive     PIP/TAZO <=4 SENSITIVE Sensitive     * >=100,000 COLONIES/mL ESCHERICHIA COLI         Radiology Studies: No results found.      Scheduled Meds:  acetaminophen  1,000 mg Oral TID   cefdinir  300 mg Oral Daily   diazepam  2.5 mg Intravenous TID   feeding supplement  237 mL Oral BID BM   furosemide  80 mg Intravenous Daily    HYDROmorphone (DILAUDID) injection  0.1 mg Intravenous Q4H   levETIRAcetam  250 mg Oral BID   liver oil-zinc oxide   Topical TID   magnesium oxide  400 mg Oral QHS   multivitamin with minerals  1 tablet Oral Daily   senna-docusate  1 tablet Oral BID   sodium chloride flush  3 mL Intravenous Q12H   tacrolimus  2 mg Oral Daily   tacrolimus  3 mg Oral QHS    Continuous Infusions:  sodium chloride       LOS: 4 days    Time spent: 40 minutes    Irine Seal, MD [facility]   To contact the attending provider between 7A-7P or the covering provider during after hours 7P-7A, please log into the web site [email or web address] and access using universal [place] password for that web site. If you do not have the password, please call the hospital operator.  09/03/2022, 10:31 AM

## 2022-09-03 NOTE — Progress Notes (Addendum)
Palliative Care Progress Note  Met with patient and her daughter. She continues to decline. Minimal urine output. Pain in her lower legs and back. Generally very uncomfortable. Increasing confusion per daughter overnight.  Given her cardio-renal failure I recommend a hospice facility for end of life care. Her daughter is in agreement. This is an irreversible process and she has a very limited life expectancy  days-weeks.   Will place referral for hospice IPU, family request [facility]. Scheduled diazapam and IV hydromorphone for comfort.  Lane Hacker, DO Palliative Medicine  Time: 35 min

## 2022-09-04 LAB — BASIC METABOLIC PANEL
Anion gap: 6 (ref 5–15)
BUN: 47 mg/dL — ABNORMAL HIGH (ref 8–23)
CO2: 32 mmol/L (ref 22–32)
Calcium: 8.5 mg/dL — ABNORMAL LOW (ref 8.9–10.3)
Chloride: 97 mmol/L — ABNORMAL LOW (ref 98–111)
Creatinine, Ser: 1.38 mg/dL — ABNORMAL HIGH (ref 0.44–1.00)
GFR, Estimated: 37 mL/min — ABNORMAL LOW (ref >60)
Glucose, Bld: 104 mg/dL — ABNORMAL HIGH (ref 70–99)
Potassium: 4.5 mmol/L (ref 3.5–5.1)
Sodium: 135 mmol/L (ref 135–145)

## 2022-09-04 MED ORDER — MORPHINE SULFATE (CONCENTRATE) 20 MG/ML PO SOLN: 2.5000 mg | ORAL | 0 refills | Status: AC | PRN

## 2022-09-04 MED ORDER — MUSCLE RUB 10-15 % EX CREA: 1.0000 | TOPICAL_CREAM | CUTANEOUS | 0 refills | Status: AC | PRN

## 2022-09-04 MED ORDER — ZINC OXIDE 40 % EX OINT: TOPICAL_OINTMENT | Freq: Three times a day (TID) | CUTANEOUS | 0 refills | Status: AC

## 2022-09-04 MED ORDER — SENNOSIDES-DOCUSATE SODIUM 8.6-50 MG PO TABS: 1.0000 | ORAL_TABLET | Freq: Two times a day (BID) | ORAL | Status: AC

## 2022-09-04 MED ORDER — DIAZEPAM 2 MG PO TABS: 2.0000 mg | ORAL_TABLET | Freq: Three times a day (TID) | ORAL | 0 refills | Status: AC

## 2022-09-04 MED ORDER — ENSURE ENLIVE PO LIQD: 237.0000 mL | Freq: Two times a day (BID) | ORAL | 12 refills | Status: AC

## 2022-09-04 MED ORDER — ACETAMINOPHEN 500 MG PO TABS: 1000.0000 mg | ORAL_TABLET | Freq: Three times a day (TID) | ORAL | 0 refills | Status: AC

## 2022-09-04 MED ORDER — FUROSEMIDE 80 MG PO TABS: 80.0000 mg | ORAL_TABLET | Freq: Every day | ORAL | 11 refills | Status: AC

## 2022-09-04 NOTE — Progress Notes (Signed)
[facility] ([facility]) Hospital Liaison Note  Bed offered and accepted for transfer today to [facility]. Unit RN please call report to [phone] prior to patient leaving the unit. Please send signed DNR and paperwork with patient.   Please leave all IV access and ports in place.   Please call with any questions or concerns. Thank you  Shanita Wicker, LCSW [facility] Hospital Liaison [phone] 

## 2022-09-04 NOTE — Progress Notes (Addendum)
Rounding Note    Patient Name: [name] Date of Encounter: 09/04/2022  [place] Cardiologist: Jenkins Rouge, MD   Subjective   Breathing stable on 4 L oxygen. Renal function back to baseline.   Inpatient Medications    Scheduled Meds:  acetaminophen  1,000 mg Oral TID   cefdinir  300 mg Oral Daily   diazepam  2.5 mg Intravenous TID   feeding supplement  237 mL Oral BID BM   furosemide  80 mg Intravenous Daily    HYDROmorphone (DILAUDID) injection  0.1 mg Intravenous Q4H   levETIRAcetam  250 mg Oral BID   liver oil-zinc oxide   Topical TID   magnesium oxide  400 mg Oral QHS   multivitamin with minerals  1 tablet Oral Daily   senna-docusate  1 tablet Oral BID   sodium chloride flush  3 mL Intravenous Q12H   tacrolimus  2 mg Oral Daily   tacrolimus  3 mg Oral QHS   Continuous Infusions:  sodium chloride     PRN Meds: sodium chloride, acetaminophen **OR** acetaminophen, HYDROmorphone (DILAUDID) injection, lip balm, Muscle Rub, prochlorperazine, sodium chloride flush   Vital Signs    Vitals:   09/03/22 1256 09/03/22 2008 09/04/22 0447 09/04/22 0545  BP: (!) 117/58 108/61  123/64  Pulse: 82 86  79  Resp: '16 18  18  '$ Temp: 98.6 F (37 C) 98.3 F (36.8 C)  97.9 F (36.6 C)  TempSrc: Oral Oral  Oral  SpO2: 98% 98%  100%  Weight:   54.2 kg   Height:        Intake/Output Summary (Last 24 hours) at 09/04/2022 0854 Last data filed at 09/03/2022 1700 Gross per 24 hour  Intake --  Output 750 ml  Net -750 ml      09/04/2022    4:47 AM 09/03/2022    4:54 AM 09/02/2022    5:00 AM  Last 3 Weights  Weight (lbs) 119 lb 7.8 oz 117 lb 11.6 oz 125 lb 10.6 oz  Weight (kg) 54.2 kg 53.4 kg 57 kg      Telemetry    Not on tele  ECG    N/A  Physical Exam   GEN: Elderly ill appearing female in no acute distress.   Neck: No JVD Cardiac: RRR, + murmurs, rubs, or gallops.  Respiratory: Clear to auscultation bilaterally. GI: Soft, nontender,  non-distended  MS: No edema; No deformity. Neuro:  Nonfocal  Psych: Normal affect   Labs    High Sensitivity Troponin:   Recent Labs  Lab 08/30/22 1643 08/30/22 1813 08/30/22 2300 08/31/22 0100  TROPONINIHS 34* 31* 32* 39*     Chemistry Recent Labs  Lab 08/30/22 1643 08/31/22 0100 09/01/22 0828 09/02/22 1112 09/03/22 0414 09/04/22 0400  NA 134* 132* 136 132* 132* 135  K 4.7 4.5 5.0 4.5 4.2 4.5  CL 98 95* 97* 94* 94* 97*  CO2 '30 26 29 27 30 '$ 32  GLUCOSE 92 171* 84 120* 85 104*  BUN 33* 33* 41* 55* 51* 47*  CREATININE 1.24* 1.40* 2.01* 2.19* 1.97* 1.38*  CALCIUM 9.0 8.6* 8.1* 7.9* 7.8* 8.5*  MG  --  2.0 2.1 2.0  --   --   PROT 6.5 7.0 6.2*  --   --   --   ALBUMIN 3.1* 3.3* 2.7*  --   --   --   AST 19 22 14*  --   --   --   ALT 13  13 11  --   --   --   ALKPHOS 66 71 62  --   --   --   BILITOT 1.2 1.8* 0.7  --   --   --   GFRNONAA 42* 36* 24* 21* 24* 37*  ANIONGAP '6 11 10 11 8 6    '$ Lipids No results for input(s): "CHOL", "TRIG", "HDL", "LABVLDL", "LDLCALC", "CHOLHDL" in the last 168 hours.  Hematology Recent Labs  Lab 09/01/22 0828 09/02/22 1112 09/03/22 0414  WBC 8.3 7.6 5.7  RBC 4.14 4.12 3.99  HGB 13.7 13.5 13.1  HCT 44.6 43.8 42.2  MCV 107.7* 106.3* 105.8*  MCH 33.1 32.8 32.8  MCHC 30.7 30.8 31.0  RDW 15.7* 14.9 14.6  PLT 165 173 158   Thyroid  Recent Labs  Lab 08/28/22 1614  TSH 1.01    BNP Recent Labs  Lab 08/30/22 1643  BNP 1,397.1*     Radiology    No results found.  Cardiac Studies   Echo 08/31/2022 1. Left ventricular ejection fraction, by estimation, is >75%. The left  ventricle has hyperdynamic function. The left ventricle has no regional  wall motion abnormalities. Left ventricular diastolic parameters are  consistent with Grade II diastolic  dysfunction (pseudonormalization). Elevated left atrial pressure. There is  the interventricular septum is flattened in systole and diastole,  consistent with right ventricular pressure  and volume overload.   2. Right ventricular systolic function is normal. The right ventricular  size is normal. There is severely elevated pulmonary artery systolic  pressure.   3. Left atrial size was severely dilated.   4. The mitral valve is normal in structure. Severe mitral valve  regurgitation. No evidence of mitral stenosis. Moderate mitral annular  calcification.   5. Tricuspid valve regurgitation is mild to moderate.   6. The aortic valve is tricuspid. Aortic valve regurgitation is moderate.  Aortic valve sclerosis is present, with no evidence of aortic valve  stenosis.   7. The inferior vena cava is dilated in size with <50% respiratory  variability, suggesting right atrial pressure of 15 mmHg.   Comparison(s): No significant change from prior study.     Patient Profile     88 y.o. female with a hx of RBBB, varicose veins, history of seizure, liver cirrhosis s/p transplant at [place] at age 13, prior history of EtOH abuse, history of mild AI and severe MR who is being seen 08/31/2022 for the evaluation of dyspnea and hypoxia at the request of Dr. Roel Cluck.   Assessment & Plan    Acute hypoxic respiratory failure 2nd to acute on chronic diastolic heart failure Severe mitral regurgitation -BNP 1397.  -Echocardiogram with LV function greater than 10%, grade 2 diastolic dysfunction, parameters consistent with volume overload.  Severe mitral regurgitation -Did not felt candidate for MitraClip -Albumin supplemented -Currently on 4 L oxygen with stable breathing - Now net I & O -535cc.  - Renal function back to her baseline - Likely she is going to [facility] place on hospice care.  - Can change dose to PO. Dose per MD. Cardiology will sign off. Call with questions. Hospice care to follow as outpatient.    Chronic kidney disease stage IIIb - Renal function back to baseline  For questions or updates, please contact [place] Please consult [email or web address] for contact info  under        SignedLeanor Kail, PA  09/04/2022, 8:54 AM     Attending Note:   The patient was  seen and examined.  Agree with assessment and plan as noted above.  Changes made to the above note as needed.  Patient seen and independently examined with Robbie Lis, PA .   We discussed all aspects of the encounter. I agree with the assessment and plan as stated above.   Acute hypoxic respiratory failure:  due to chronic CHF and severe MR .  She is not a candidate for mitra clip . The plan is to transfer her to [facility] place for hospice care.       I have spent a total of 40 minutes with patient reviewing hospital  notes , telemetry, EKGs, labs and examining patient as well as establishing an assessment and plan that was discussed with the patient.  > 50% of time was spent in direct patient care.  [place] will sign off.   Medication Recommendations:   Other recommendations (labs, testing, etc):   Follow up as an outpatient:  PRN    Thayer Headings, Brooke Bonito., MD, [facility] 09/04/2022, 9:09 AM [address]  [place] Pager [phone]

## 2022-09-04 NOTE — Progress Notes (Signed)
PMT no charge note  Patient going to residential hospice today. Chart reviewed, medication history noted, ok to continue comfort measures until transfer to hospice.  No charge Loistine Chance MD [place] palliative.

## 2022-09-04 NOTE — Plan of Care (Signed)
  Problem: Education: Goal: Ability to demonstrate management of disease process will improve Outcome: Progressing Goal: Ability to verbalize understanding of medication therapies will improve Outcome: Progressing   Problem: Education: Goal: Knowledge of General Education information will improve Description: Including pain rating scale, medication(s)/side effects and non-pharmacologic comfort measures 09/04/2022 1957 by Jerene Pitch, RN Outcome: Progressing 09/04/2022 1956 by Jerene Pitch, RN Outcome: Progressing   Problem: Health Behavior/Discharge Planning: Goal: Ability to manage health-related needs will improve 09/04/2022 1957 by Jerene Pitch, RN Outcome: Progressing 09/04/2022 1956 by Jerene Pitch, RN Outcome: Progressing

## 2022-09-04 NOTE — Plan of Care (Signed)
  Problem: Education: Goal: Ability to demonstrate management of disease process will improve Outcome: Progressing Goal: Ability to verbalize understanding of medication therapies will improve Outcome: Progressing   Problem: Education: Goal: Knowledge of General Education information will improve Description: Including pain rating scale, medication(s)/side effects and non-pharmacologic comfort measures 09/04/2022 2000 by Jerene Pitch, RN Outcome: Progressing 09/04/2022 1957 by Jerene Pitch, RN Outcome: Progressing 09/04/2022 1956 by Jerene Pitch, RN Outcome: Progressing   Problem: Health Behavior/Discharge Planning: Goal: Ability to manage health-related needs will improve 09/04/2022 2000 by Jerene Pitch, RN Outcome: Progressing 09/04/2022 1957 by Jerene Pitch, RN Outcome: Progressing 09/04/2022 1956 by Jerene Pitch, RN Outcome: Progressing

## 2022-09-04 NOTE — TOC Transition Note (Addendum)
Transition of Care [facility]) - CM/SW Discharge Note   Patient Details  Name: [name] MRN: [id number] Date of Birth: 1932-09-01  Transition of [facility] CM/SW Contact:  Roseanne Kaufman, RN Phone Number: 09/04/2022, 1:27 PM   Clinical Narrative:   Patient will be transported to [facility] today. Awaiting contact from Hospice when to coordinate transportation.   TOC will continue to follow.  - 3:30p Received contact from [facility] that patient has a bed available today and can be transported. Notified MD, RN, and [name].   Awaiting discharge summary.    Barriers to Discharge: Continued Medical Work up   Patient Goals and CMS Choice     Choice offered to / list presented to : NA  Discharge Placement                       Discharge Plan and Services In-house Referral: NA Discharge Planning Services: CM Consult                                 Social Determinants of Health (SDOH) Interventions     Readmission Risk Interventions     No data to display

## 2022-09-04 NOTE — Plan of Care (Signed)
  Problem: Education: Goal: Ability to demonstrate management of disease process will improve Outcome: Progressing Goal: Ability to verbalize understanding of medication therapies will improve Outcome: Progressing   

## 2022-09-04 NOTE — Progress Notes (Addendum)
Pt discharge to [facility] place in stable condition. Family present at time of discharge and understands all instructions. Picked up by [employer]

## 2022-09-04 NOTE — Plan of Care (Signed)
  Problem: Education: Goal: Ability to demonstrate management of disease process will improve Outcome: Progressing Goal: Ability to verbalize understanding of medication therapies will improve Outcome: Progressing   Problem: Education: Goal: Knowledge of General Education information will improve Description: Including pain rating scale, medication(s)/side effects and non-pharmacologic comfort measures Outcome: Progressing   Problem: Health Behavior/Discharge Planning: Goal: Ability to manage health-related needs will improve Outcome: Progressing   Problem: Health Behavior/Discharge Planning: Goal: Ability to manage health-related needs will improve Outcome: Progressing

## 2022-09-04 NOTE — Discharge Summary (Signed)
Physician Discharge Summary  [name] LGX:[id number] DOB: 1931/12/10 DOA: 08/30/2022  PCP: Marin Olp, MD  Admit date: 08/30/2022 Discharge date: 09/04/2022  Time spent: 55 minutes  Recommendations for Outpatient Follow-up:  Patient will be discharged to residential hospice home at [facility].  Follow-up with MD at hospice home.   Discharge Diagnoses:  Principal Problem:   Acute on chronic diastolic CHF (congestive heart failure) (HCC) Active Problems:   Hepatic cirrhosis (HCC)   Liver replaced by transplant [facility])   Seizure disorder ([place])   Mitral valve insufficiency and aortic valve insufficiency   CKD (chronic kidney disease), stage III (HCC)   Elevated troponin   Acute respiratory failure with hypoxia (HCC)   UTI (urinary tract infection)   Acute on chronic congestive heart failure (HCC)   E. coli UTI   Discharge Condition: Stable  Diet recommendation: Soft diet  Filed Weights   09/02/22 0500 09/03/22 0454 09/04/22 0447  Weight: 57 kg 53.4 kg 54.2 kg    History of present illness:  HPI per Dr. Cyndie Mull is a 88 y.o. female with medical history significant of    diastolic CHF, seizure disorder on Keppra, history of cirrhosis status post liver transplant on Prograf, aortic insufficiency, having cardiopulmonary syndrome CKD stage 3.   Presented with   fatigue Comes in with diarrhea appears to be dehydrated but also hypoxic.  She is taking Prograf for history of liver transplant secondary to cirrhosis.  Recently was seen by her primary care provider who diagnosed her AKI but also noted her potassium was high.  They felt it was may be a lab error repeated labs today.  While in the office patient noted to be hypoxic down to 88% with subjective shortness of breath.  She has had decreased p.o. intake but no vomiting.  No fevers or chills Initial calcium 10.6 and potassium at 6.2 On arrival to emergency department hypoxic down to 86% and started on 2  L Diarrhea has been ongoing for last week but currently improving.  The main complaint now is really shortness of breath and fatigue     Recent Labs       Lab Results  Component Value Date    [place] NEGATIVE 08/30/2022        Regarding pertinent Chronic problems:        HTN on Coreg 3.125 bid    chronic CHF diastolic - last echo May 8144 Aortic insufficiency  Grade II diastolic dysfunction There is severely elevated pulmonary artery systolic  pressure. The estimated right ventricular systolic pressure is 81.8 mmHg.  mitral insufficiency with markedly increased forward flow Severe mitral valve regurgitation.  Tricuspid valve regurgitation is moderate to severe.    History of seizure disorder on Keppra       CKD stage IIIa- baseline Cr 1.0 Estimated Creatinine Clearance: 26.1 mL/min (A) (by C-G formula based on SCr of 1.24 mg/dL (H)).   Recent Labs       Lab Results  Component Value Date    CREATININE 1.24 (H) 08/30/2022    CREATININE 1.30 (H) 08/30/2022    CREATININE 1.57 (H) 08/28/2022         Liver disease status post liver transplant    While in ER: Creatinine now down to 1.24 with potassium down to 4.7   Hospital Course:  #1 acute respiratory failure with hypoxia secondary to acute on chronic diastolic CHF exacerbation likely secondary to worsening severe MVR. -Patient admitted with shortness of breath,  generalized fatigue, noted to be hypoxic on room air. -BNP on admission noted to be 1397. -Chest x-ray with cardiomegaly with bilateral airspace disease, likely edema/CHF. -CT angiogram chest done negative for PE, however with cardiomegaly with bilateral airspace disease and effusions likely CHF, CAD, aortic atherosclerosis noted. -2D echo ordered and done with a EF > 02%,IOXB, grade 2 diastolic dysfunction, severely dilated left atrial size, severe MVR, mild to moderate TVR, moderate AVR. -Patient was maintained on 2 L nasal cannula with sats ranging  from 92 to 97%. -Patient was on Lasix 40 mg IV every 12 hours, however no significant urine output recorded/diuresis. -Patient seen in consultation by cardiology and feels patient's acute CHF likely secondary to worsening mitral valve disease, it is noted per cardiology that patient with no options for cardiac surgery.  Patient also noted not to be a good candidate for mitral valve clipping.  Cardiology recommended continuation of IV diuresis with palliative care consultation. -IV Lasix increased to 80 mg twice daily per cardiology on 09/01/2022. -Due to creatinine creeping up IV Lasix initially held per cardiology.  -Patient noted on 09/02/2022 to have worsening shortness of breath received IV albumin and a dose of Lasix 80 mg IV x1 with urine output of 575 cc over the past 24 hours.  Unsure of accuracy of urine output results.  Creatinine trending back down.   -Patient was maintained on Lasix 80 mg IV daily per cardiology recommendations. -Cardiology recommended palliative care input. -Cardiology recommended transitioning to oral Lasix on discharge. -Patient will be discharged to residential hospice home as patient was transitioned to full comfort measures.   2.  Worsening severe MVR -Patient seen by cardiology felt not to be a candidate for surgery including MitraClip. -Repeat 2D echo done during this hospitalization with severe MVR, severely dilated left atrial size, mild to moderate TVR, moderate AVR, EF > 35%, grade 2 diastolic dysfunction. -Cardiology recommended palliative care consultation. -Cardiology following. -Palliative care following and patient was transitioned to full comfort measures.   -Patient will be discharged to residential hospice home.     3.  E, Coli UTI -Urine cultures with > 100,000 colonies of E. coli which is pansensitive.   -Status post 5-day course of antibiotic treatment.    4.  Seizure disorder -No seizures noted.   -Patient maintained on home regimen Keppra  250 mg twice daily.    5.  CKD stage IIIb -Renal function was initially fluctuating. -Creatinine initially was trending up but trending back down with ongoing diuresis.Marland Kitchen  -IV Lasix initially held however due to worsening shortness of breath on 09/02/2022 patient received IV albumin and Lasix 80 mg IV x1 with clinical improvement. -Patient maintained on IV Lasix 80 mg daily during the hospitalization per cardiology recommendation and will be transition to oral Lasix 80 mg daily on discharge to residential hospice home. -Patient transitioned to full comfort measures and will be discharged to residential hospice home.   6.  Elevated troponin -Elevated but flat. -Like secondary to acute CHF exacerbation. -Cardiology following. -No further work-up at this time. -Patient transition to full comfort measures and be discharged to residential hospice home.   7.  History of liver cirrhosis status post transplant at [facility] 15 years ago -Stable. -Patient transitioned to comfort measures and will be discharged to residential hospice home.  Procedures: 2D echo 08/31/2022 CT angiogram chest 08/30/2022 CT abdomen and pelvis 08/30/2022 Chest x-ray 08/30/2022    Consultations: Palliative care [place], DO 08/31/2022 Cardiology: Dr.O'Neal 08/31/2022  Discharge Exam: Vitals:   09/04/22 0545 09/04/22 1308  BP: 123/64 117/88  Pulse: 79 81  Resp: 18 18  Temp: 97.9 F (36.6 C) 97.8 F (36.6 C)  SpO2: 100% 99%    General: NAD. Cardiovascular: Regular rate rhythm.  Positive murmur.  No lower extremity edema. Respiratory: Bibasilar crackles.  No wheezing.  Fair air movement.  Speaking in full sentences.  No use of accessory muscles of respiration.  Discharge Instructions   Discharge Instructions     Diet general   Complete by: As directed    Soft diet.   Increase activity slowly   Complete by: As directed    No wound care   Complete by: As directed       Allergies as of 09/04/2022        Reactions   Nsaids Other (See Comments)   Liver transplant precautions    Tape Other (See Comments)   TAPE IRRITATES THE SKIN- is very sensitive!!   Codeine Nausea And Vomiting        Medication List     STOP taking these medications    alendronate 70 MG tablet Commonly known as: FOSAMAX   B-12 PO   Biotin 5 MG Caps   CALCIUM+D3 PO   One-A-Day Womens 50+ Tabs       TAKE these medications    acetaminophen 500 MG tablet Commonly known as: TYLENOL Take 2 tablets (1,000 mg total) by mouth 3 (three) times daily for 5 days.   carvedilol 3.125 MG tablet Commonly known as: COREG TAKE ONE TABLET BY MOUTH TWICE A DAY What changed: when to take this   diazepam 2 MG tablet Commonly known as: Valium Take 1 tablet (2 mg total) by mouth every 8 (eight) hours.   feeding supplement Liqd Take 237 mLs by mouth 2 (two) times daily between meals. Start taking on: September 05, 2022   furosemide 80 MG tablet Commonly known as: Lasix Take 1 tablet (80 mg total) by mouth daily.   levETIRAcetam 250 MG tablet Commonly known as: KEPPRA TAKE 1 TABLET BY MOUTH  TWICE DAILY   liver oil-zinc oxide 40 % ointment Commonly known as: DESITIN Apply topically 3 (three) times daily. Apply to irritated skin   magnesium oxide 400 (240 Mg) MG tablet Commonly known as: MAG-OX Take 400 mg by mouth at bedtime.   morphine 20 MG/ML concentrated solution Commonly known as: ROXANOL Take 0.13-0.25 mLs (2.6-5 mg total) by mouth every 4 (four) hours as needed for severe pain.   Muscle Rub 10-15 % Crea Apply 1 Application topically as needed for muscle pain. Apply to legs.  MD aware of allergy warning approved medication.   senna-docusate 8.6-50 MG tablet Commonly known as: Senokot-S Take 1 tablet by mouth 2 (two) times daily.   tacrolimus 1 MG capsule Commonly known as: PROGRAF Take 2-3 mg by mouth See admin instructions. Take 2 mg by mouth in the morning and 3 mg at bedtime    zolpidem 6.25 MG CR tablet Commonly known as: AMBIEN CR TAKE ONE TABLET BY MOUTH EVERY NIGHT AT BEDTIME AS NEEDED FOR SLEEP What changed: See the new instructions.       Allergies  Allergen Reactions   Nsaids Other (See Comments)    Liver transplant precautions    Tape Other (See Comments)    TAPE IRRITATES THE SKIN- is very sensitive!!   Codeine Nausea And Vomiting    Follow-up Information     MD AT [place] Follow up.  The results of significant diagnostics from this hospitalization (including imaging, microbiology, ancillary and laboratory) are listed below for reference.    Significant Diagnostic Studies: ECHOCARDIOGRAM COMPLETE  Result Date: 08/31/2022    ECHOCARDIOGRAM REPORT   Patient Name:   [name] Date of Exam: 08/31/2022 Medical Rec #:  [id number]       Height:       63.0 in Accession #:    [id number]      Weight:       114.0 lb Date of Birth:  03/20/1932       BSA:          1.523 m Patient Age:    11 years        BP:           119/56 mmHg Patient Gender: F               HR:           89 bpm. Exam Location:  Inpatient Procedure: 2D Echo, Cardiac Doppler and Color Doppler Indications:    CHF  History:        Patient has prior history of Echocardiogram examinations, most                 recent 04/25/2022.  Sonographer:    Jefferey Pica Referring Phys: [place]  1. Left ventricular ejection fraction, by estimation, is >75%. The left ventricle has hyperdynamic function. The left ventricle has no regional wall motion abnormalities. Left ventricular diastolic parameters are consistent with Grade II diastolic dysfunction (pseudonormalization). Elevated left atrial pressure. There is the interventricular septum is flattened in systole and diastole, consistent with right ventricular pressure and volume overload.  2. Right ventricular systolic function is normal. The right ventricular size is normal. There is severely  elevated pulmonary artery systolic pressure.  3. Left atrial size was severely dilated.  4. The mitral valve is normal in structure. Severe mitral valve regurgitation. No evidence of mitral stenosis. Moderate mitral annular calcification.  5. Tricuspid valve regurgitation is mild to moderate.  6. The aortic valve is tricuspid. Aortic valve regurgitation is moderate. Aortic valve sclerosis is present, with no evidence of aortic valve stenosis.  7. The inferior vena cava is dilated in size with <50% respiratory variability, suggesting right atrial pressure of 15 mmHg. Comparison(s): No significant change from prior study. FINDINGS  Left Ventricle: Left ventricular ejection fraction, by estimation, is >75%. The left ventricle has hyperdynamic function. The left ventricle has no regional wall motion abnormalities. The left ventricular internal cavity size was normal in size. There is no left ventricular hypertrophy. The interventricular septum is flattened in systole and diastole, consistent with right ventricular pressure and volume overload. Left ventricular diastolic parameters are consistent with Grade II diastolic dysfunction  (pseudonormalization). Elevated left atrial pressure. Right Ventricle: The right ventricular size is normal. Right ventricular systolic function is normal. There is severely elevated pulmonary artery systolic pressure. The tricuspid regurgitant velocity is 3.37 m/s, and with an assumed right atrial pressure  of 15 mmHg, the estimated right ventricular systolic pressure is 24.2 mmHg. Left Atrium: Left atrial size was severely dilated. Right Atrium: Right atrial size was normal in size. Pericardium: Trivial pericardial effusion is present. Mitral Valve: The mitral valve is normal in structure. Moderate mitral annular calcification. Severe mitral valve regurgitation. No evidence of mitral valve stenosis. MV peak gradient, 18.0 mmHg. The mean mitral valve gradient is 9.0 mmHg. Tricuspid Valve:  The tricuspid valve is  normal in structure. Tricuspid valve regurgitation is mild to moderate. No evidence of tricuspid stenosis. Aortic Valve: The aortic valve is tricuspid. Aortic valve regurgitation is moderate. Aortic regurgitation PHT measures 432 msec. Aortic valve sclerosis is present, with no evidence of aortic valve stenosis. Aortic valve peak gradient measures 12.6 mmHg. Pulmonic Valve: The pulmonic valve was normal in structure. Pulmonic valve regurgitation is trivial. No evidence of pulmonic stenosis. Aorta: The aortic root is normal in size and structure. Venous: The inferior vena cava is dilated in size with less than 50% respiratory variability, suggesting right atrial pressure of 15 mmHg. IAS/Shunts: No atrial level shunt detected by color flow Doppler. Additional Comments: There is a small pleural effusion in the left lateral region.  LEFT VENTRICLE PLAX 2D LVIDd:         4.60 cm   Diastology LVIDs:         2.85 cm   LV e' medial:    3.15 cm/s LV PW:         0.90 cm   LV E/e' medial:  54.9 LV IVS:        0.90 cm   LV e' lateral:   2.59 cm/s LVOT diam:     1.73 cm   LV E/e' lateral: 66.8 LV SV:         55 LV SV Index:   36 LVOT Area:     2.36 cm  RIGHT VENTRICLE             IVC RV Basal diam:  2.80 cm     IVC diam: 3.10 cm RV S prime:     14.00 cm/s TAPSE (M-mode): 2.1 cm LEFT ATRIUM              Index        RIGHT ATRIUM           Index LA diam:        4.70 cm  3.09 cm/m   RA Area:     15.20 cm LA Vol (A2C):   115.0 ml 75.52 ml/m  RA Volume:   33.10 ml  21.74 ml/m LA Vol (A4C):   106.0 ml 69.61 ml/m LA Biplane Vol: 112.0 ml 73.55 ml/m  AORTIC VALVE                 PULMONIC VALVE AV Area (Vmax): 1.85 cm     PV Vmax:       1.16 m/s AV Vmax:        177.50 cm/s  PV Peak grad:  5.4 mmHg AV Peak Grad:   12.6 mmHg LVOT Vmax:      139.00 cm/s LVOT Vmean:     90.100 cm/s LVOT VTI:       0.234 m AI PHT:         432 msec  AORTA Ao Root diam: 2.90 cm Ao Asc diam:  3.30 cm MITRAL VALVE                 TRICUSPID VALVE MV Area (PHT): 2.07 cm     TR Peak grad:   45.4 mmHg MV Area VTI:   0.94 cm     TR Vmax:        337.00 cm/s MV Peak grad:  18.0 mmHg MV Mean grad:  9.0 mmHg     SHUNTS MV Vmax:       2.12 m/s     Systemic VTI:  0.23 m MV Vmean:  147.0 cm/s   Systemic Diam: 1.73 cm MV Decel Time: 366 msec MR Peak grad: 83.7 mmHg MR Vmax:      457.50 cm/s MV E velocity: 173.00 cm/s MV A velocity: 150.00 cm/s MV E/A ratio:  1.15 Kirk Ruths MD Electronically signed by Kirk Ruths MD Signature Date/Time: 08/31/2022/12:48:18 PM    Final    CT ABDOMEN PELVIS W CONTRAST  Result Date: 08/30/2022 CLINICAL DATA:  Abdominal pain, acute, nonlocalized EXAM: CT ABDOMEN AND PELVIS WITH CONTRAST TECHNIQUE: Multidetector CT imaging of the abdomen and pelvis was performed using the standard protocol following bolus administration of intravenous contrast. RADIATION DOSE REDUCTION: This exam was performed according to the departmental dose-optimization program which includes automated exposure control, adjustment of the mA and/or kV according to patient size and/or use of iterative reconstruction technique. CONTRAST:  148m OMNIPAQUE IOHEXOL 350 MG/ML SOLN COMPARISON:  11/13/2004 FINDINGS: Lower chest: See chest CT report Hepatobiliary: Heterogeneous enhancement throughout the liver without focal lesion. Prior cholecystectomy. Common bile duct is dilated measuring 14 mm, likely related to age and post cholecystectomy state. Pancreas: Atrophy.  No focal abnormality or ductal dilatation. Spleen: 11 mm low-density lesion in superior aspect of the spleen, likely small cyst or hemangioma. Normal size. Adrenals/Urinary Tract: No adrenal abnormality. No focal renal abnormality. No stones or hydronephrosis. Urinary bladder is unremarkable. Stomach/Bowel: Stomach, large and small bowel grossly unremarkable. Vascular/Lymphatic: Heavily calcified aorta and iliac vessels. No evidence of aneurysm or adenopathy. Reproductive: No  visible focal abnormality. Other: No free fluid or free air. Musculoskeletal: No acute bony abnormality. Old healed left inferior pubic ramus fracture. IMPRESSION: No acute findings in the abdomen or pelvis. Aortoiliac atherosclerosis. Electronically Signed   By: KRolm BaptiseM.D.   On: 08/30/2022 17:25   CT Angio Chest PE W and/or Wo Contrast  Result Date: 08/30/2022 CLINICAL DATA:  Pulmonary embolism (PE) suspected, high prob EXAM: CT ANGIOGRAPHY CHEST WITH CONTRAST TECHNIQUE: Multidetector CT imaging of the chest was performed using the standard protocol during bolus administration of intravenous contrast. Multiplanar CT image reconstructions and MIPs were obtained to evaluate the vascular anatomy. RADIATION DOSE REDUCTION: This exam was performed according to the departmental dose-optimization program which includes automated exposure control, adjustment of the mA and/or kV according to patient size and/or use of iterative reconstruction technique. CONTRAST:  1023mOMNIPAQUE IOHEXOL 350 MG/ML SOLN COMPARISON:  06/13/2006 FINDINGS: Cardiovascular: Cardiomegaly. Densely calcified mitral valve annulus, as well as coronary arteries and scattered aortic calcifications. No aneurysm. No filling defects in the pulmonary arteries to suggest pulmonary emboli. Mediastinum/Nodes: No mediastinal, hilar, or axillary adenopathy. Trachea and esophagus are unremarkable. Thyroid unremarkable. Lungs/Pleura: Small to moderate bilateral effusions, right greater than left. Bilateral ground-glass airspace opacities, likely edema/CHF. Upper Abdomen: No acute findings Musculoskeletal: Chest wall soft tissues are unremarkable. No acute bony abnormality. Review of the MIP images confirms the above findings. IMPRESSION: No evidence of pulmonary embolus. Cardiomegaly with bilateral airspace disease and effusions, likely CHF. Coronary artery disease. Aortic Atherosclerosis (ICD10-I70.0). Electronically Signed   By: KeRolm Baptise.D.    On: 08/30/2022 17:22   DG Chest 2 View  Result Date: 08/30/2022 CLINICAL DATA:  Fatigue, low oxygen EXAM: CHEST - 2 VIEW COMPARISON:  04/04/2018 FINDINGS: Cardiomegaly. Bilateral airspace opacities concerning for edema. No effusions or pneumothorax. No acute bony abnormality. Aortic atherosclerosis. Dense mitral valve annular calcifications. IMPRESSION: Cardiomegaly with bilateral airspace disease, likely edema/CHF. Electronically Signed   By: KeRolm Baptise.D.   On: 08/30/2022 17:16    Microbiology:  Recent Results (from the past 240 hour(s))  SARS Coronavirus 2 by RT PCR (hospital order, performed in [facility] hospital lab) *cepheid single result test* Anterior Nasal Swab     Status: None   Collection Time: 08/30/22  4:49 PM   Specimen: Anterior Nasal Swab  Result Value Ref Range Status   SARS Coronavirus 2 by RT PCR NEGATIVE NEGATIVE Final    Comment: (NOTE) SARS-CoV-2 target nucleic acids are NOT DETECTED.  The SARS-CoV-2 RNA is generally detectable in upper and lower respiratory specimens during the acute phase of infection. The lowest concentration of SARS-CoV-2 viral copies this assay can detect is 250 copies / mL. A negative result does not preclude SARS-CoV-2 infection and should not be used as the sole basis for treatment or other patient management decisions.  A negative result may occur with improper specimen collection / handling, submission of specimen other than nasopharyngeal swab, presence of viral mutation(s) within the areas targeted by this assay, and inadequate number of viral copies (<250 copies / mL). A negative result must be combined with clinical observations, patient history, and epidemiological information.  Fact Sheet for Patients:   https://www.patel.info/  Fact Sheet for Healthcare Providers: https://hall.com/  This test is not yet approved or  cleared by the [place] FDA and has been authorized for  detection and/or diagnosis of SARS-CoV-2 by FDA under an Emergency Use Authorization (EUA).  This EUA will remain in effect (meaning this test can be used) for the duration of the COVID-19 declaration under Section 564(b)(1) of the Act, 21 U.S.C. section 360bbb-3(b)(1), unless the authorization is terminated or revoked sooner.  Performed at [facility], [place] [address], [place], [place] [address]   Urine Culture     Status: Abnormal   Collection Time: 08/30/22 10:45 PM   Specimen: Urine, Clean Catch  Result Value Ref Range Status   Specimen Description   Final    URINE, CLEAN CATCH Performed at [facility], [place] [address] [place], [place] [address]    Special Requests   Final    NONE Performed at [facility], [place] [address] [place], [place] [address]    Culture >=100,000 COLONIES/mL ESCHERICHIA COLI (A)  Final   Report Status 09/02/2022 FINAL  Final   Organism ID, Bacteria ESCHERICHIA COLI (A)  Final      Susceptibility   Escherichia coli - MIC*    AMPICILLIN <=2 SENSITIVE Sensitive     CEFAZOLIN <=4 SENSITIVE Sensitive     CEFEPIME <=0.12 SENSITIVE Sensitive     CEFTRIAXONE <=0.25 SENSITIVE Sensitive     CIPROFLOXACIN <=0.25 SENSITIVE Sensitive     GENTAMICIN <=1 SENSITIVE Sensitive     IMIPENEM <=0.25 SENSITIVE Sensitive     NITROFURANTOIN <=16 SENSITIVE Sensitive     TRIMETH/SULFA <=20 SENSITIVE Sensitive     AMPICILLIN/SULBACTAM <=2 SENSITIVE Sensitive     PIP/TAZO <=4 SENSITIVE Sensitive     * >=100,000 COLONIES/mL ESCHERICHIA COLI     Labs: Basic Metabolic Panel: Recent Labs  Lab 08/31/22 0100 09/01/22 0828 09/02/22 1112 09/03/22 0414 09/04/22 0400  NA 132* 136 132* 132* 135  K 4.5 5.0 4.5 4.2 4.5  CL 95* 97* 94* 94* 97*  CO2 '26 29 27 30 '$ 32  GLUCOSE 171* 84 120* 85 104*  BUN 33* 41* 55* 51* 47*  CREATININE 1.40* 2.01* 2.19* 1.97* 1.38*  CALCIUM 8.6* 8.1* 7.9* 7.8* 8.5*  MG 2.0 2.1 2.0  --   --  Liver Function Tests: Recent Labs  Lab 08/30/22 1643 08/31/22 0100 09/01/22 0828  AST 19 22 14*  ALT '13 13 11  '$ ALKPHOS 66 71 62  BILITOT 1.2 1.8* 0.7  PROT 6.5 7.0 6.2*  ALBUMIN 3.1* 3.3* 2.7*   No results for input(s): "LIPASE", "AMYLASE" in the last 168 hours. No results for input(s): "AMMONIA" in the last 168 hours. CBC: Recent Labs  Lab 08/30/22 1643 08/31/22 0100 09/01/22 0828 09/02/22 1112 09/03/22 0414  WBC 3.9* 12.8* 8.3 7.6 5.7  NEUTROABS  --   --  6.8 6.5 4.5  HGB 13.7 14.9 13.7 13.5 13.1  HCT 44.5 47.5* 44.6 43.8 42.2  MCV 107.0* 106.3* 107.7* 106.3* 105.8*  PLT 202 246 165 173 158   Cardiac Enzymes: No results for input(s): "CKTOTAL", "CKMB", "CKMBINDEX", "TROPONINI" in the last 168 hours. BNP: BNP (last 3 results) Recent Labs    08/30/22 1643  BNP 1,397.1*    ProBNP (last 3 results) No results for input(s): "PROBNP" in the last 8760 hours.  CBG: Recent Labs  Lab 09/02/22 1419  GLUCAP 171*       Signed:  Irine Seal MD.  [facility] 09/04/2022, 4:19 PM

## 2022-10-01 ENCOUNTER — Encounter: Payer: Self-pay | Admitting: Cardiovascular Disease

## 2022-10-01 ENCOUNTER — Encounter: Payer: Self-pay | Admitting: Family Medicine

## 2022-10-02 ENCOUNTER — Telehealth: Payer: Self-pay | Admitting: Family Medicine

## 2022-10-02 NOTE — Telephone Encounter (Signed)
Answers: -Does PCP want to be primary care/attending physician for patient.  YES -Does PCP want to defer symptom management to hospice physicians or manage those him self. DEFER to hospice physicians

## 2022-10-02 NOTE — Telephone Encounter (Signed)
See below

## 2022-10-02 NOTE — Telephone Encounter (Signed)
Caller States: -pt is currently in [facility] ICU -pt is alert, stable and in good spirits. -pt will be released and going home on 10/03/22 with daughter.   Caller Asks: -Does PCP want to be primary care/attending physician for patient. -Does PCP want to defer symptom management to hospice physicians or manage those him self.    Caller requests: -return phone call.

## 2022-10-03 NOTE — Telephone Encounter (Signed)
Called and spoke with [facility] and below message given.

## 2022-10-12 ENCOUNTER — Other Ambulatory Visit: Payer: Self-pay | Admitting: Family Medicine

## 2022-11-02 DEATH — deceased

## 2022-11-15 ENCOUNTER — Other Ambulatory Visit: Payer: Medicare Other
